# Patient Record
Sex: Female | Born: 1960 | Race: Black or African American | Hispanic: No | Marital: Single | Smoking: Former smoker
Health system: Southern US, Community
[De-identification: ages and names within clinical notes are randomized; demographics above are authoritative.]

## PROBLEM LIST (undated history)

## (undated) DIAGNOSIS — E119 Type 2 diabetes mellitus without complications: Secondary | ICD-10-CM

## (undated) DIAGNOSIS — F209 Schizophrenia, unspecified: Secondary | ICD-10-CM

## (undated) DIAGNOSIS — I10 Essential (primary) hypertension: Secondary | ICD-10-CM

## (undated) DIAGNOSIS — F319 Bipolar disorder, unspecified: Secondary | ICD-10-CM

---

## 2006-09-08 ENCOUNTER — Ambulatory Visit: Payer: Self-pay | Admitting: Family Medicine

## 2006-09-17 ENCOUNTER — Ambulatory Visit: Payer: Self-pay | Admitting: Family Medicine

## 2007-05-19 ENCOUNTER — Ambulatory Visit: Payer: Self-pay | Admitting: Family Medicine

## 2008-01-07 ENCOUNTER — Ambulatory Visit: Payer: Self-pay | Admitting: Family Medicine

## 2009-01-16 ENCOUNTER — Ambulatory Visit: Payer: Self-pay | Admitting: Nephrology

## 2009-01-30 ENCOUNTER — Observation Stay: Payer: Self-pay | Admitting: Nephrology

## 2009-03-05 ENCOUNTER — Ambulatory Visit: Payer: Self-pay | Admitting: Vascular Surgery

## 2009-03-16 ENCOUNTER — Ambulatory Visit: Payer: Self-pay | Admitting: Vascular Surgery

## 2009-03-21 ENCOUNTER — Ambulatory Visit: Payer: Self-pay | Admitting: Vascular Surgery

## 2009-06-27 ENCOUNTER — Ambulatory Visit: Payer: Self-pay | Admitting: Vascular Surgery

## 2009-07-03 ENCOUNTER — Ambulatory Visit: Payer: Self-pay | Admitting: Vascular Surgery

## 2009-08-20 ENCOUNTER — Ambulatory Visit: Payer: Self-pay | Admitting: Vascular Surgery

## 2009-09-10 ENCOUNTER — Ambulatory Visit: Payer: Self-pay | Admitting: Vascular Surgery

## 2009-10-25 ENCOUNTER — Ambulatory Visit: Payer: Self-pay | Admitting: Vascular Surgery

## 2010-01-12 ENCOUNTER — Emergency Department: Payer: Self-pay | Admitting: Nephrology

## 2010-01-12 ENCOUNTER — Other Ambulatory Visit: Payer: Self-pay | Admitting: Nephrology

## 2010-01-15 ENCOUNTER — Ambulatory Visit: Payer: Self-pay | Admitting: Vascular Surgery

## 2010-02-25 ENCOUNTER — Other Ambulatory Visit: Payer: Self-pay | Admitting: Nephrology

## 2010-02-26 ENCOUNTER — Ambulatory Visit: Payer: Self-pay | Admitting: Vascular Surgery

## 2010-04-18 ENCOUNTER — Ambulatory Visit: Payer: Self-pay | Admitting: Vascular Surgery

## 2010-05-02 ENCOUNTER — Ambulatory Visit: Payer: Self-pay | Admitting: Vascular Surgery

## 2010-05-21 ENCOUNTER — Ambulatory Visit: Payer: Self-pay | Admitting: Vascular Surgery

## 2010-05-29 ENCOUNTER — Emergency Department: Payer: Self-pay | Admitting: Emergency Medicine

## 2010-06-07 ENCOUNTER — Ambulatory Visit: Payer: Self-pay | Admitting: Vascular Surgery

## 2010-07-09 ENCOUNTER — Emergency Department: Payer: Self-pay | Admitting: Emergency Medicine

## 2010-08-06 ENCOUNTER — Ambulatory Visit: Payer: Self-pay | Admitting: Vascular Surgery

## 2010-09-19 ENCOUNTER — Ambulatory Visit: Payer: Self-pay | Admitting: Vascular Surgery

## 2010-10-01 ENCOUNTER — Ambulatory Visit: Payer: Self-pay | Admitting: Vascular Surgery

## 2010-10-02 ENCOUNTER — Inpatient Hospital Stay: Payer: Self-pay | Admitting: Vascular Surgery

## 2010-10-22 ENCOUNTER — Observation Stay: Payer: Self-pay | Admitting: Nephrology

## 2010-10-23 ENCOUNTER — Inpatient Hospital Stay: Payer: Self-pay | Admitting: Internal Medicine

## 2012-04-08 ENCOUNTER — Emergency Department: Payer: Self-pay | Admitting: Emergency Medicine

## 2012-04-08 LAB — COMPREHENSIVE METABOLIC PANEL
Albumin: 2.5 g/dL — ABNORMAL LOW (ref 3.4–5.0)
Alkaline Phosphatase: 131 U/L (ref 50–136)
Anion Gap: 7 (ref 7–16)
Bilirubin,Total: 0.3 mg/dL (ref 0.2–1.0)
Calcium, Total: 7.4 mg/dL — ABNORMAL LOW (ref 8.5–10.1)
Co2: 31 mmol/L (ref 21–32)
EGFR (African American): 9 — ABNORMAL LOW
EGFR (Non-African Amer.): 8 — ABNORMAL LOW
Glucose: 115 mg/dL — ABNORMAL HIGH (ref 65–99)
Osmolality: 278 (ref 275–301)
Potassium: 4.1 mmol/L (ref 3.5–5.1)
SGOT(AST): 26 U/L (ref 15–37)
Sodium: 135 mmol/L — ABNORMAL LOW (ref 136–145)

## 2012-04-08 LAB — URINALYSIS, COMPLETE
Bilirubin,UR: NEGATIVE
Blood: NEGATIVE
Ketone: NEGATIVE
Nitrite: NEGATIVE
Ph: 6 (ref 4.5–8.0)
Protein: 30
Specific Gravity: 1.01 (ref 1.003–1.030)

## 2012-04-08 LAB — CBC
HGB: 10.6 g/dL — ABNORMAL LOW (ref 12.0–16.0)
MCH: 33.4 pg (ref 26.0–34.0)
MCHC: 33.8 g/dL (ref 32.0–36.0)
Platelet: 165 10*3/uL (ref 150–440)
RBC: 3.16 10*6/uL — ABNORMAL LOW (ref 3.80–5.20)
RDW: 17.8 % — ABNORMAL HIGH (ref 11.5–14.5)

## 2012-04-08 LAB — VALPROIC ACID LEVEL: Valproic Acid: 95 ug/mL

## 2012-04-08 LAB — MAGNESIUM: Magnesium: 2.4 mg/dL

## 2012-04-08 LAB — TROPONIN I: Troponin-I: <0.02 ng/mL

## 2012-04-13 LAB — CULTURE, BLOOD (SINGLE)

## 2012-05-21 ENCOUNTER — Emergency Department: Payer: Self-pay | Admitting: Emergency Medicine

## 2012-05-21 LAB — COMPREHENSIVE METABOLIC PANEL
Albumin: 2.8 g/dL — ABNORMAL LOW (ref 3.4–5.0)
Alkaline Phosphatase: 121 U/L (ref 50–136)
Anion Gap: 9 (ref 7–16)
BUN: 35 mg/dL — ABNORMAL HIGH (ref 7–18)
Bilirubin,Total: 0.5 mg/dL (ref 0.2–1.0)
Creatinine: 5.56 mg/dL — ABNORMAL HIGH (ref 0.60–1.30)
Glucose: 126 mg/dL — ABNORMAL HIGH (ref 65–99)
Osmolality: 283 (ref 275–301)
SGOT(AST): 25 U/L (ref 15–37)
SGPT (ALT): 16 U/L (ref 12–78)
Total Protein: 8.1 g/dL (ref 6.4–8.2)

## 2012-05-21 LAB — CBC WITH DIFFERENTIAL/PLATELET
Basophil %: 0.1 %
Eosinophil %: 0.2 %
HCT: 30.9 % — ABNORMAL LOW (ref 35.0–47.0)
MCH: 34.7 pg — ABNORMAL HIGH (ref 26.0–34.0)
MCHC: 34.3 g/dL (ref 32.0–36.0)
Neutrophil #: 7.4 10*3/uL — ABNORMAL HIGH (ref 1.4–6.5)
Neutrophil %: 89.8 %
Platelet: 151 10*3/uL (ref 150–440)
RBC: 3.05 10*6/uL — ABNORMAL LOW (ref 3.80–5.20)
RDW: 18 % — ABNORMAL HIGH (ref 11.5–14.5)
WBC: 8.3 10*3/uL (ref 3.6–11.0)

## 2012-05-21 LAB — RAPID INFLUENZA A&B ANTIGENS

## 2012-05-27 LAB — CULTURE, BLOOD (SINGLE)

## 2012-09-17 ENCOUNTER — Inpatient Hospital Stay: Payer: Self-pay | Admitting: Internal Medicine

## 2012-09-17 LAB — COMPREHENSIVE METABOLIC PANEL
Albumin: 2.7 g/dL — ABNORMAL LOW (ref 3.4–5.0)
Anion Gap: 9 (ref 7–16)
BUN: 35 mg/dL — ABNORMAL HIGH (ref 7–18)
Calcium, Total: 9.3 mg/dL (ref 8.5–10.1)
Co2: 29 mmol/L (ref 21–32)
EGFR (African American): 7 — ABNORMAL LOW
EGFR (Non-African Amer.): 6 — ABNORMAL LOW
Osmolality: 284 (ref 275–301)
Potassium: 4.2 mmol/L (ref 3.5–5.1)
SGOT(AST): 19 U/L (ref 15–37)
SGPT (ALT): 14 U/L (ref 12–78)
Total Protein: 7.5 g/dL (ref 6.4–8.2)

## 2012-09-17 LAB — URINALYSIS, COMPLETE
Bilirubin,UR: NEGATIVE
Blood: NEGATIVE
Protein: 30
RBC,UR: NONE SEEN /HPF (ref 0–5)
Specific Gravity: 1.013 (ref 1.003–1.030)
Squamous Epithelial: NONE SEEN
WBC UR: <1 /HPF (ref 0–5)

## 2012-09-17 LAB — DRUG SCREEN, URINE
Amphetamines, Ur Screen: NEGATIVE (ref ?–1000)
Barbiturates, Ur Screen: NEGATIVE (ref ?–200)
Benzodiazepine, Ur Scrn: NEGATIVE (ref ?–200)
Cocaine Metabolite,Ur ~~LOC~~: NEGATIVE (ref ?–300)
MDMA (Ecstasy)Ur Screen: POSITIVE (ref ?–500)

## 2012-09-17 LAB — VALPROIC ACID LEVEL: Valproic Acid: 114 ug/mL — ABNORMAL HIGH

## 2012-09-17 LAB — CK TOTAL AND CKMB (NOT AT ARMC)
CK, Total: 53 U/L (ref 21–215)
CK-MB: <0.5 ng/mL — ABNORMAL LOW (ref 0.5–3.6)

## 2012-09-17 LAB — CBC
HGB: 10.1 g/dL — ABNORMAL LOW (ref 12.0–16.0)
MCH: 33.4 pg (ref 26.0–34.0)
MCHC: 34.3 g/dL (ref 32.0–36.0)
RDW: 15.5 % — ABNORMAL HIGH (ref 11.5–14.5)

## 2012-09-17 LAB — TROPONIN I: Troponin-I: <0.02 ng/mL

## 2012-09-17 LAB — AMMONIA: Ammonia, Plasma: 61 umol/L — ABNORMAL HIGH

## 2012-09-17 LAB — PHOSPHORUS: Phosphorus: 3.1 mg/dL

## 2012-09-20 LAB — PHOSPHORUS: Phosphorus: 2.9 mg/dL (ref 2.5–4.9)

## 2014-08-11 NOTE — Consult Note (Signed)
PATIENT NAMENECIE, [name] MR#:  [id number] DATE OF BIRTH:  [birth year]  SEX:  Female  RACE:  African-American  AGE:  54 years.  CONSULTING PHYSICIAN:  Tahmir Kleckner K. Franchot Mimes, MD  DATE OF ADMISSION:  09/17/2012  DATE OF DICTATION:  09/18/2012  PLACE OF DICTATION:  [place], room #123, [place], [place]  SUBJECTIVE:  The patient was seen in consultation in room #123. According to information obtained from chart, the patient has a long history of schizophrenia and is a dialysis patient and gets her dialysis Monday, Wednesday, Friday. She went for dialysis on Friday, 09/17/2012, and she had a syncopal episode and was brought to [facility]. She has end-stage renal disease and is on hemodialysis for the same. According to the information obtained, the patient's Depakote was at toxic level, and it is 114 mcg/m on admission. In addition, she was positive for MDMA. The patient has been stopped of all psychiatric medications, and is being followed by Dr. Holley Raring. Depakote is not dialyze-able by hemodialysis, as per Dr. Holley Raring, so levels will be followed clinically.  MENTAL STATUS:  The patient was seen in the Nurses station in a geri chair She appears calm. She appears alert, but she is not oriented to place or time, and she said today is Monday. She is oriented to self only, that is 1. She appears calm and quiet, really confused. Does not appear to be actively responding to any internal stimuli, and no agitation noted. Further mental status could not be done. Insight and judgment appear to be guarded and poor.  IMPRESSION:  Schizophrenia, chronic.  RECOMMEND:  Continue to hold off psychiatric medications until the patient is stabilized and her labs come back within normal limits, and then she can be reevaluated for medications. Currently the patient does not seem to be agitated and appears to be calm. There is no need for even p.r.n. medication unless she must have it for  agitation.   ____________________________ Wallace Cullens. Franchot Mimes, MD skc:mr D: 09/18/2012 16:56:00 ET T: 09/18/2012 18:12:52 ET JOB#: [id number]  cc: Arlyn Leak K. Franchot Mimes, MD, <Dictator> Dewain Penning MD ELECTRONICALLY SIGNED 09/19/2012 15:45

## 2014-08-11 NOTE — Discharge Summary (Signed)
PATIENT NAMERAKHEE, [name] MR#:  [id number] DATE OF BIRTH:  1960-09-12  DATE OF ADMISSION:  09/17/2012 DATE OF DISCHARGE:  09/20/2012   PRESENTING COMPLAINT: Confusion, altered mental status.   DISCHARGE DIAGNOSES: 1.  Acute encephalopathy suspected due to polypharmacy and likely Depakote toxicity, improved, resolved.  2.  Depakote toxicity, resolved, medication dosage adjusted.  3.  End-stage renal dialysis.  4.  Fall with contusion over the lips and mouth, no active bleeding.  5.  Chronic schizophrenia.  6.  Type 2 diabetes.  7.  Hyperlipidemia.   CONDITION ON DISCHARGE: Fair.   MEDICATIONS: 1.  Tylenol 650 mg p.o. q.4 p.r.n.  2.  Aspirin 81 mg daily.  3.  Zocor 40 mg at bedtime.  4.  Renvela 0.8 g t.i.d. with meals.  5.  Multivitamin p.o. daily.  6.  Wellbutrin XL 150 p.o. daily.  7.  Ativan 0.5 mg b.i.d. p.r.n. for anxiety.  8.  Depakote extended-release 500 mg daily.  9.  Fluphenazine 2.5 mg  daily.  10.  Lantus 30 units at bedtime.  11.  Glucotrol 5 mg daily.   DISCHARGE INSTRUCTIONS:   1.  Renal diet, 1800 calorie ADA diet.  2.  Resume home health RN, PT.  3.  Continue outpatient hemodialysis as before.  4.  Accu-Cheks a.c. and h.s.   PRIMARY CARE PHYSICIAN: Manual Meier. Bridget Hartshorn, MD   CONSULTATIONS: Psychiatric consultation with Dr. Franchot Mimes. Nephrology consultation with Dr. Holley Raring.   LABORATORY AND RADIOLOGICAL DATA AT DISCHARGE: Phosphorus is 2.9. Ammonia is 33. Depakote level down to 36.   Chest x-ray: Bilateral atelectasis.   CT of the head: No acute intracranial abnormality.   CT of the cervical spine: No evidence of acute osseous abnormality.   CT of maxillofacial: No CT evidence of acute osseous abnormalities.   BRIEF SUMMARY OF HOSPITAL COURSE: [name] is a 54 year old African American female with history of chronic schizophrenia, end-stage renal disease on hemodialysis, comes to the Emergency Room from her group home after she was found confused with  altered mental status, had an accident and fall with contusion over her lips and mouth. She was admitted with:  1.  Acute encephalopathy/acute altered mental status due to suspected Depakote toxicity: Her levels were 114 mcg/mL. All her psych meds were held. She was given some IV fluids. Routine dialysis was done. No evidence of sepsis was noted. Her mentation improved, appears to be at baseline.  2.  Depakote toxicity: The patient's Depakote was held. Her levels were 114 mcg/mL, came down to 36. Mentation improved.  3.  End-stage renal disease: Hemodialysis was continued while in-house.  4.  Chronic schizophrenia:  Dr. Franchot Mimes saw the patient and we adjusted her home medication dosage.  5.  Type 2 diabetes: On Lantus, glipizide and sliding scale.  6.  Hyperlipidemia: Continue statins.   Overall hospital stay remained stable.   CODE STATUS: The patient remained a full code.   TIME SPENT: 40 minutes.   ____________________________ Hart Rochester Posey Pronto, MD sap:jm D: 09/20/2012 13:03:43 ET T: 09/20/2012 14:14:33 ET JOB#: [id number]  cc: Jamerion Cabello A. Posey Pronto, MD, <Dictator> Manual Meier. Bridget Hartshorn, [place] Franchot Mimes, MD [place] Lilian Kapur, MD  Ilda Basset MD ELECTRONICALLY SIGNED 09/23/2012 19:38

## 2014-08-11 NOTE — H&P (Signed)
PATIENT NAMECHAUNDA, [name] MR#:  [id number] DATE OF BIRTH:  03/12/1961  DATE OF ADMISSION:  09/17/2012  PRIMARY CARE PHYSICIAN: Laura Meier. Laura Hartshorn, MD  CHIEF COMPLAINT: Altered mental status, fall today.   HISTORY OF PRESENT ILLNESS: History is obtained from ER MD and staff. The patient is unable to give much history and review of systems. [name] is an obese 54 year old African American female with history of end-stage renal disease on hemodialysis Monday, Wednesday, Friday, history of chronic schizophrenia and type 2 diabetes, comes to the Emergency Room after she had a fall at the group home/nursing home. She fell front face, hitting her mouth. The patient had been lethargic for a couple of days per the facility staff. The patient here  was found to have elevated valproic acid level of 114 mcg/mL. There is no evidence of any sepsis noted at this time. She empirically received a dose of vancomycin and Zosyn in the Emergency Room.   PAST MEDICAL HISTORY: 1.  End-stage renal disease on hemodialysis Monday, Wednesday, Friday.  2.  Chronic schizophrenia.  3.  Anemia of chronic disease.  4.  Hypertension, but the patient is not on any antihypertensive at this time.  5.  Type 2 diabetes, on insulin.   ALLERGIES: No known drug allergies.   MEDICATIONS: 1.  Aspirin 81 mg daily.  2.  Benztropine 1 mg at bedtime.  3.  Depakote ER 500 mg extended-release 4 tablets, which is 2000 mg, at bedtime.  4.  Fluphenazine 5 mg at bedtime.  5.  Glipizide 5 mg p.o. b.i.d.  6.  Lantus 30 units at bedtime.  7.  Lorazepam 1 mg 4 times a day.  8.  Multivitamin p.o. daily.  9.  Rena-Vite p.o. daily.  10.  Renvela 800 mg 1 tablet 3 times a day with meals.  11.  Simvastatin 40 mg daily.  12.  Tylenol 500 mg daily.  13.  Wellbutrin XL 150 mg daily.   SOCIAL HISTORY: The patient is a resident at a group home. There is no history of smoking, alcohol or drug abuse from old records.   FAMILY HISTORY: From old  records, mother had diabetes, kidney disease. Father had heart disease and peptic ulcer disease.   REVIEW OF SYSTEMS: Unobtainable.   PHYSICAL EXAMINATION: GENERAL: The patient is morbidly obese, not in acute distress. She is lethargic, opens eyes to verbal commands. She moves all her extremities well. No focal neuro deficit noted.  VITAL SIGNS: The patient is afebrile. Pulse is 77, blood pressure 174/76, sats 99% on room air.  HEENT: Head is atraumatic. Eyes: PERRLA. EOM intact. Oral mucosa: There is some bleeding present over the front teeth and lower lip, which appears a little bit swollen. No active bleeding noted at this time. No laceration noted.  NECK: Supple. No JVD. No carotid bruit.  LUNGS: Clear to auscultation bilaterally. No rales, rhonchi, respiratory distress or labored breathing.  HEART: Both the heart sounds are normal. Rate, rhythm regular. PMI not lateralized. Chest nontender. EXTREMITIES: Feeble pedal pulses, good femoral pulses. No lower extremity edema.  ABDOMEN: Soft,  benign, nontender. No organomegaly. Positive bowel sounds.  NEUROLOGIC: Limited secondary to the patient's lethargy. She moves all her extremities well. No focal neuro deficit noted. No focal cranial nerve deficit noted at this time.  SKIN: Warm and dry.   RADIOLOGICAL STUDIES: Chest x-ray: Mild bibasilar opacities, likely secondary to atelectasis.   CT of the head without acute abnormalities.   CT of the  cervical spine: No evidence of any acute osseous abnormality.   CT maxillofacial: No evidence of acute fracture or dislocation. There are findings concerning for a fracture involving the lower second molar on the left. Also concerning, multiple dental caries.   LABORATORY AND DIAGNOSTIC DATA: Her pH is 7.42; pCO2 is 48; PaO2 is 77. Glucose is 145, BUN 35, creatinine 6.81, sodium 132, potassium 4.2, chloride 99, bicarbonate 29. LFTs within normal limits. White count is 7.4, hemoglobin and hematocrit is  10.1 and 29.5, platelet count 129. Urinalysis negative for urinary tract infection. Urine drug screen positive for MDMA. Valproic acid is 114 mcg/ mL. Cardiac enzymes are negative.   EKG shows sinus rhythm with PACs, flipped T waves in anterior leads.   ASSESSMENT AND PLAN: [name] is a 54 year old with history of chronic schizophrenia, type 2 diabetes, end-stage renal disease, comes in with:  1.  Acute encephalopathy/altered mental status, status post fall at the group home: At this time appears to be due to Depakote toxicity. No evidence of sepsis at present. Chest x-ray is clear. Urine is clear. The patient is afebrile. White count is normal. We will admit the patient to the medical floor, keep her "nothing by mouth"  at this time. Continue some intravenous fluids at low rate. Follow up Depakote levels closely. Neuro checks every 4 hours. 2.  Depakote toxicity: Levels are 114 mcg/mL. Is not dialyzable through conventional hemodialysis per Dr. Holley Padilla. Will follow levels clinically. Hold psychiatric medications.  3.  End-stage renal disease: Dr. Holley Padilla to see patient. Resume inpatient hemodialysis.  4.  Fall with contusion over the lip and mouth along with fracture of the left second molar, with multiple dental caries according to CT maxillofacial area. The patient will need a dental appointment at discharge. At this time, there is no active bleeding. We will continue to monitor for any bleeding and continue oral hygiene.  5.  Chronic schizophrenia: Hold psychiatric medications. Will have psychiatric inpatient evaluation for adjustment of Depakote dose if needed. She is taking about 2000 mg daily.  6.  Type 2 diabetes: Since the patient is going to be "nothing by mouth,"  will keep her on sliding scale insulin for now. Resume her Lantus and glipizide once she is able to take oral.  7.  Hyperlipidemia: Oral statins.  8.  Deep vein thrombosis prophylaxis with subcutaneous heparin.  9.  Further work-up  according to the patient's clinical course. Hospital admission plan was discussed with the patient was discussed with the ER physician.   10.  Please note, the patient's tetanus is up to date.  11.  Please note, the patient is from [facility]. Phone number is [phone]. Caregiver is Romie Levee.    ____________________________ Hart Rochester Posey Pronto, MD sap:jm D: 09/17/2012 14:57:34 ET T: 09/17/2012 15:26:25 ET JOB#: [id number]  cc: Bacilio Abascal A. Posey Pronto, MD, <Dictator> Laura Meier. Laura Hartshorn, MD Ilda Basset MD ELECTRONICALLY SIGNED 09/23/2012 19:38

## 2016-09-18 ENCOUNTER — Other Ambulatory Visit: Payer: Self-pay | Admitting: Family Medicine

## 2016-09-18 DIAGNOSIS — Z1231 Encounter for screening mammogram for malignant neoplasm of breast: Secondary | ICD-10-CM

## 2016-10-09 ENCOUNTER — Ambulatory Visit
Admission: RE | Admit: 2016-10-09 | Discharge: 2016-10-09 | Disposition: A | Discharge status: Home / Self Care | Payer: Medicaid Other | Source: Ambulatory Visit | Attending: Family Medicine | Admitting: Family Medicine

## 2016-10-09 DIAGNOSIS — Z1231 Encounter for screening mammogram for malignant neoplasm of breast: Secondary | ICD-10-CM

## 2016-10-21 ENCOUNTER — Emergency Department (HOSPITAL_COMMUNITY): Payer: Medicaid Other

## 2016-10-21 ENCOUNTER — Inpatient Hospital Stay (HOSPITAL_COMMUNITY)
Admission: EM | Admit: 2016-10-21 | Discharge: 2016-10-24 | DRG: 091 | Disposition: A | Discharge status: Nursing Home (Includes ICF) | Payer: Medicaid Other | Attending: Internal Medicine | Admitting: Internal Medicine

## 2016-10-21 ENCOUNTER — Encounter (HOSPITAL_COMMUNITY): Payer: Self-pay | Admitting: Emergency Medicine

## 2016-10-21 DIAGNOSIS — D631 Anemia in chronic kidney disease: Secondary | ICD-10-CM | POA: Diagnosis present

## 2016-10-21 DIAGNOSIS — I120 Hypertensive chronic kidney disease with stage 5 chronic kidney disease or end stage renal disease: Secondary | ICD-10-CM | POA: Diagnosis present

## 2016-10-21 DIAGNOSIS — Z720 Tobacco use: Secondary | ICD-10-CM

## 2016-10-21 DIAGNOSIS — R4182 Altered mental status, unspecified: Secondary | ICD-10-CM

## 2016-10-21 DIAGNOSIS — G92 Toxic encephalopathy: Principal | ICD-10-CM | POA: Diagnosis present

## 2016-10-21 DIAGNOSIS — T433X5A Adverse effect of phenothiazine antipsychotics and neuroleptics, initial encounter: Secondary | ICD-10-CM | POA: Diagnosis present

## 2016-10-21 DIAGNOSIS — F209 Schizophrenia, unspecified: Secondary | ICD-10-CM | POA: Diagnosis present

## 2016-10-21 DIAGNOSIS — E1122 Type 2 diabetes mellitus with diabetic chronic kidney disease: Secondary | ICD-10-CM | POA: Diagnosis present

## 2016-10-21 DIAGNOSIS — G9340 Encephalopathy, unspecified: Secondary | ICD-10-CM

## 2016-10-21 DIAGNOSIS — Z7982 Long term (current) use of aspirin: Secondary | ICD-10-CM

## 2016-10-21 DIAGNOSIS — E8889 Other specified metabolic disorders: Secondary | ICD-10-CM | POA: Diagnosis present

## 2016-10-21 DIAGNOSIS — T43595A Adverse effect of other antipsychotics and neuroleptics, initial encounter: Secondary | ICD-10-CM | POA: Diagnosis present

## 2016-10-21 DIAGNOSIS — F319 Bipolar disorder, unspecified: Secondary | ICD-10-CM | POA: Diagnosis present

## 2016-10-21 DIAGNOSIS — N186 End stage renal disease: Secondary | ICD-10-CM

## 2016-10-21 DIAGNOSIS — R4702 Dysphasia: Secondary | ICD-10-CM

## 2016-10-21 DIAGNOSIS — Z992 Dependence on renal dialysis: Secondary | ICD-10-CM

## 2016-10-21 DIAGNOSIS — E119 Type 2 diabetes mellitus without complications: Secondary | ICD-10-CM

## 2016-10-21 DIAGNOSIS — T424X5A Adverse effect of benzodiazepines, initial encounter: Secondary | ICD-10-CM | POA: Diagnosis present

## 2016-10-21 DIAGNOSIS — Z794 Long term (current) use of insulin: Secondary | ICD-10-CM

## 2016-10-21 DIAGNOSIS — I10 Essential (primary) hypertension: Secondary | ICD-10-CM

## 2016-10-21 DIAGNOSIS — I4581 Long QT syndrome: Secondary | ICD-10-CM | POA: Diagnosis present

## 2016-10-21 DIAGNOSIS — E11649 Type 2 diabetes mellitus with hypoglycemia without coma: Secondary | ICD-10-CM | POA: Diagnosis present

## 2016-10-21 HISTORY — DX: Type 2 diabetes mellitus without complications: E11.9

## 2016-10-21 HISTORY — DX: Schizophrenia, unspecified: F20.9

## 2016-10-21 HISTORY — DX: Bipolar disorder, unspecified: F31.9

## 2016-10-21 HISTORY — DX: Essential (primary) hypertension: I10

## 2016-10-21 LAB — CBC
HCT: 29.8 % — ABNORMAL LOW (ref 36.0–46.0)
HEMOGLOBIN: 10 g/dL — AB (ref 12.0–15.0)
MCH: 31.1 pg (ref 26.0–34.0)
MCHC: 33.6 g/dL (ref 30.0–36.0)
MCV: 92.5 fL (ref 78.0–100.0)
Platelets: 230 10*3/uL (ref 150–400)
RBC: 3.22 MIL/uL — AB (ref 3.87–5.11)
RDW: 13.8 % (ref 11.5–15.5)
WBC: 7 10*3/uL (ref 4.0–10.5)

## 2016-10-21 LAB — COMPREHENSIVE METABOLIC PANEL
ALT: 14 U/L (ref 14–54)
ANION GAP: 10 (ref 5–15)
AST: 15 U/L (ref 15–41)
Albumin: 3.6 g/dL (ref 3.5–5.0)
Alkaline Phosphatase: 87 U/L (ref 38–126)
BILIRUBIN TOTAL: 0.4 mg/dL (ref 0.3–1.2)
BUN: 27 mg/dL — ABNORMAL HIGH (ref 6–20)
CALCIUM: 9.3 mg/dL (ref 8.9–10.3)
CO2: 31 mmol/L (ref 22–32)
Chloride: 93 mmol/L — ABNORMAL LOW (ref 101–111)
Creatinine, Ser: 6.32 mg/dL — ABNORMAL HIGH (ref 0.44–1.00)
GFR, EST AFRICAN AMERICAN: 8 mL/min — AB (ref >60)
GFR, EST NON AFRICAN AMERICAN: 7 mL/min — AB (ref >60)
Glucose, Bld: 146 mg/dL — ABNORMAL HIGH (ref 65–99)
Potassium: 4 mmol/L (ref 3.5–5.1)
Sodium: 134 mmol/L — ABNORMAL LOW (ref 135–145)
TOTAL PROTEIN: 7.5 g/dL (ref 6.5–8.1)

## 2016-10-21 LAB — CBG MONITORING, ED: GLUCOSE-CAPILLARY: 154 mg/dL — AB (ref 65–99)

## 2016-10-21 LAB — TROPONIN I: Troponin I: <0.03 ng/mL (ref <0.03)

## 2016-10-21 LAB — AMMONIA: AMMONIA: 28 umol/L (ref 9–35)

## 2016-10-21 NOTE — ED Triage Notes (Signed)
Pt went to dialysis yesterday, has been confused and lethargic since.  Pt unable to recall birthday, where she is, or why she is here.

## 2016-10-21 NOTE — ED Notes (Signed)
Ambulated patient around the nurses station. Patient had no problems walking but she still was altered.

## 2016-10-21 NOTE — ED Provider Notes (Signed)
[facility] DEPT Provider Note   CSN: [id number] Arrival date & time: 10/21/16  2121     History   Chief Complaint Chief Complaint  Patient presents with  . Altered Mental Status    HPI [name] is a 56 y.o. female.  HPI  The patient is a 56 year old female, she has a history of end-stage renal disease on dialysis, she is also a diabetic on sliding scale insulin, has bipolar disorder and takes Depakote as well as other medications to treat mental health disorders. The patient is unable to give any information that she is somnolent too obtunded and according to the paramedics prehospital she has been this way for the last day and a half since leaving dialysis. There was no report of fever, vomiting, diarrhea, she is on sliding scale insulin. There is no report of a prehospital blood sugar check according to the nurse.     Past Medical History:  Diagnosis Date  . Bipolar 1 disorder ([place])   . Diabetes mellitus without complication ([place])   . Hypertension   . Schizophrenia ([place])     There are no active problems to display for this patient.   No past surgical history on file.  OB History    No data available       Home Medications    Prior to Admission medications   Medication Sig Start Date End Date Taking? Authorizing Provider  acetaminophen (TYLENOL) 500 MG tablet Take 500 mg by mouth every 6 (six) hours as needed.   Yes [provider]  aspirin EC 81 MG tablet Take 81 mg by mouth daily.   Yes [provider]  benztropine (COGENTIN) 1 MG tablet Take 1 mg by mouth.   Yes [provider]  buPROPion (WELLBUTRIN XL) 300 MG 24 hr tablet Take 300 mg by mouth daily.   Yes [provider]  cinacalcet (SENSIPAR) 30 MG tablet Take 30 mg by mouth 3 (three) times a week. Take 30mg  on Tuesday, Thursday, and Saturday   Yes [provider]  divalproex (DEPAKOTE) 500 MG DR tablet Take 500 mg by mouth.   Yes [provider]    docusate sodium (COLACE) 100 MG capsule Take 100 mg by mouth.   Yes [provider]  famotidine (PEPCID) 40 MG tablet Take 40 mg by mouth daily.   Yes [provider]  fluPHENAZine (PROLIXIN) 5 MG tablet Take 5 mg by mouth.   Yes [provider]  glipiZIDE (GLUCOTROL) 5 MG tablet Take 5 mg by mouth.   Yes [provider]  insulin glargine (LANTUS) 100 UNIT/ML injection Inject into the skin.   Yes [provider]  latanoprost (XALATAN) 0.005 % ophthalmic solution 1 drop.   Yes [provider]  LORazepam (ATIVAN) 0.5 MG tablet Take 0.5 mg by mouth 2 (two) times daily. Take at 8:00am and 8:00pm   Yes [provider]  Multiple Vitamin (MULTIVITAMIN WITH MINERALS) TABS tablet Take 1 tablet by mouth daily.   Yes [provider]  paliperidone (INVEGA) 3 MG 24 hr tablet Take 3 mg by mouth.   Yes [provider]  sevelamer carbonate (RENVELA) 800 MG tablet Take 800 mg by mouth.   Yes [provider]    Family History No family history on file.  Social History Social History  Substance Use Topics  . Smoking status: Unknown If Ever Smoked  . Smokeless tobacco: Current User  . Alcohol use No     Allergies  Patient has no known allergies.   Review of Systems Review of Systems  Unable to perform ROS: Mental status change     Physical Exam Updated Vital Signs BP (!) 145/77   Pulse 65   Temp 97.8 F (36.6 C) (Oral)   Resp 19   Wt 68 kg (150 lb)   SpO2 99%   Physical Exam  Constitutional: She appears well-developed and well-nourished.  Somnolent - arousable to voice  HENT:  Head: Normocephalic and atraumatic.  Mouth/Throat: No oropharyngeal exudate.  Oral cavity is filled with snuff  Eyes: Conjunctivae and EOM are normal. Pupils are equal, round, and reactive to light. Right eye exhibits no discharge. Left eye exhibits no discharge. No scleral icterus.  Neck: Normal range of motion. Neck  supple. No JVD present. No thyromegaly present.  Cardiovascular: Normal rate, regular rhythm, normal heart sounds and intact distal pulses.  Exam reveals no gallop and no friction rub.   No murmur heard. Normal thrill in the RUE over the access  Pulmonary/Chest: Effort normal and breath sounds normal. No respiratory distress. She has no wheezes. She has no rales.  Abdominal: Soft. Bowel sounds are normal. She exhibits no distension and no mass. There is no tenderness.  Musculoskeletal: Normal range of motion. She exhibits no edema or tenderness.  Lymphadenopathy:    She has no cervical adenopathy.  Neurological:  The  patient is able to open her eyes, she has symmetrical pupils, she is able to help sit up in the bed and is able to stay in a sitting position during the exam of her posterior thorax. She is able to lift all 4 extremities with 5 out of 5 strength but does appear to fall asleep very quicklyand becomes somnolent again. Her voice is quiet but answers are appropriate   Skin: Skin is warm and dry. No rash noted. No erythema.  Psychiatric: She has a normal mood and affect. Her behavior is normal.  Nursing note and vitals reviewed.    ED Treatments / Results  Labs (all labs ordered are listed, but only abnormal results are displayed) Labs Reviewed  COMPREHENSIVE METABOLIC PANEL - Abnormal; Notable for the following:       Result Value   Sodium 134 (*)    Chloride 93 (*)    Glucose, Bld 146 (*)    BUN 27 (*)    Creatinine, Ser 6.32 (*)    GFR calc non Af Amer 7 (*)    GFR calc Af Amer 8 (*)    All other components within normal limits  CBC - Abnormal; Notable for the following:    RBC 3.22 (*)    Hemoglobin 10.0 (*)    HCT 29.8 (*)    All other components within normal limits  CBG MONITORING, ED - Abnormal; Notable for the following:    Glucose-Capillary 154 (*)    All other components within normal limits  AMMONIA  TROPONIN I    EKG  EKG  Interpretation  Date/Time:  Tuesday October 21 2016 21:27:43 EDT Ventricular Rate:  71 PR Interval:    QRS Duration: 100 QT Interval:  552 QTC Calculation: 600 R Axis:   19 Text Interpretation:  Sinus rhythm Left ventricular hypertrophy QT normal (lead V2 example) since last tracing no significant change Confirmed by Noemi Chapel [phone]) on 10/21/2016 9:41:28 PM       Radiology Dg Chest 1 View  Result Date: 10/21/2016 CLINICAL DATA:  Altered mental status after dialysis EXAM: CHEST 1 VIEW  COMPARISON:  Chest radiograph 09/17/2012 FINDINGS: Right IJ approach hemodialysis catheter tip overlies the intrahepatic inferior vena cava. Position is approximately unchanged from the study of 09/17/2012 when allowing for technical differences. There is bibasilar atelectasis without other focal consolidation. No pulmonary edema. Heart size is normal. IMPRESSION: No active disease. Electronically Signed   By: Ulyses Jarred M.D.   On: 10/21/2016 23:03   Ct Head Wo Contrast  Result Date: 10/21/2016 CLINICAL DATA:  Acute onset of altered level of consciousness. Initial encounter. EXAM: CT HEAD WITHOUT CONTRAST TECHNIQUE: Contiguous axial images were obtained from the base of the skull through the vertex without intravenous contrast. COMPARISON:  CT of the head performed 09/17/2012 FINDINGS: Brain: No evidence of acute infarction, hemorrhage, hydrocephalus, extra-axial collection or mass lesion/mass effect. Prominence of the ventricles and sulci reflects moderate cortical volume loss. Mild periventricular white matter change likely reflects small vessel ischemic microangiopathy. The brainstem and fourth ventricle are within normal limits. The basal ganglia are unremarkable in appearance. The cerebral hemispheres demonstrate grossly normal gray-white differentiation. No mass effect or midline shift is seen. Vascular: No hyperdense vessel or unexpected calcification. Skull: There is no evidence of fracture; visualized  osseous structures are unremarkable in appearance. Sinuses/Orbits: The visualized portions of the orbits are within normal limits. The paranasal sinuses and mastoid air cells are well-aerated. Other: No significant soft tissue abnormalities are seen. IMPRESSION: 1. No acute intracranial pathology seen on CT. 2. Moderate cortical volume loss and scattered small vessel ischemic microangiopathy. Electronically Signed   By: Garald Balding M.D.   On: 10/21/2016 22:53    Procedures Procedures (including critical care time)  Medications Ordered in ED Medications - No data to display   Initial Impression / Assessment and Plan / ED Course  I have reviewed the triage vital signs and the nursing notes.  Pertinent labs & imaging results that were available during my care of the patient were reviewed by me and considered in my medical decision making (see chart for details).   The patient is somnolent, the exact source of the patient's symptoms is unclear however she will need to have a lab workup as well as a CT scan of the brain, EKG does not show any acute findings other than left ventricular hypertrophy.   The pt has not had any acute findings on labs or imaging - her ECG is unchanged - no answer - pt was able to ambulate but with assistance and is still confused and mildly somnolent -   D/w Dr. Darrick Meigs who will admit.  Final Clinical Impressions(s) / ED Diagnoses   Final diagnoses:  Altered mental status, unspecified altered mental status type    New Prescriptions New Prescriptions   No medications on file     Noemi Chapel, MD 10/22/16 0000

## 2016-10-22 DIAGNOSIS — Z794 Long term (current) use of insulin: Secondary | ICD-10-CM

## 2016-10-22 DIAGNOSIS — G9340 Encephalopathy, unspecified: Secondary | ICD-10-CM | POA: Diagnosis not present

## 2016-10-22 DIAGNOSIS — I10 Essential (primary) hypertension: Secondary | ICD-10-CM

## 2016-10-22 DIAGNOSIS — E8889 Other specified metabolic disorders: Secondary | ICD-10-CM | POA: Diagnosis present

## 2016-10-22 DIAGNOSIS — E1122 Type 2 diabetes mellitus with diabetic chronic kidney disease: Secondary | ICD-10-CM | POA: Diagnosis present

## 2016-10-22 DIAGNOSIS — F209 Schizophrenia, unspecified: Secondary | ICD-10-CM | POA: Diagnosis present

## 2016-10-22 DIAGNOSIS — F319 Bipolar disorder, unspecified: Secondary | ICD-10-CM | POA: Diagnosis present

## 2016-10-22 DIAGNOSIS — D631 Anemia in chronic kidney disease: Secondary | ICD-10-CM | POA: Diagnosis present

## 2016-10-22 DIAGNOSIS — I4581 Long QT syndrome: Secondary | ICD-10-CM | POA: Diagnosis present

## 2016-10-22 DIAGNOSIS — T433X5A Adverse effect of phenothiazine antipsychotics and neuroleptics, initial encounter: Secondary | ICD-10-CM | POA: Diagnosis present

## 2016-10-22 DIAGNOSIS — T424X5A Adverse effect of benzodiazepines, initial encounter: Secondary | ICD-10-CM | POA: Diagnosis present

## 2016-10-22 DIAGNOSIS — G92 Toxic encephalopathy: Secondary | ICD-10-CM | POA: Diagnosis present

## 2016-10-22 DIAGNOSIS — Z7982 Long term (current) use of aspirin: Secondary | ICD-10-CM | POA: Diagnosis not present

## 2016-10-22 DIAGNOSIS — Z992 Dependence on renal dialysis: Secondary | ICD-10-CM

## 2016-10-22 DIAGNOSIS — E11649 Type 2 diabetes mellitus with hypoglycemia without coma: Secondary | ICD-10-CM | POA: Diagnosis present

## 2016-10-22 DIAGNOSIS — N186 End stage renal disease: Secondary | ICD-10-CM | POA: Diagnosis present

## 2016-10-22 DIAGNOSIS — E119 Type 2 diabetes mellitus without complications: Secondary | ICD-10-CM

## 2016-10-22 DIAGNOSIS — T43595A Adverse effect of other antipsychotics and neuroleptics, initial encounter: Secondary | ICD-10-CM | POA: Diagnosis present

## 2016-10-22 DIAGNOSIS — I120 Hypertensive chronic kidney disease with stage 5 chronic kidney disease or end stage renal disease: Secondary | ICD-10-CM | POA: Diagnosis present

## 2016-10-22 DIAGNOSIS — R4182 Altered mental status, unspecified: Secondary | ICD-10-CM

## 2016-10-22 DIAGNOSIS — R4702 Dysphasia: Secondary | ICD-10-CM | POA: Diagnosis not present

## 2016-10-22 DIAGNOSIS — Z720 Tobacco use: Secondary | ICD-10-CM | POA: Diagnosis not present

## 2016-10-22 LAB — BLOOD GAS, ARTERIAL
ACID-BASE EXCESS: 6 mmol/L — AB (ref 0.0–2.0)
BICARBONATE: 29.3 mmol/L — AB (ref 20.0–28.0)
Drawn by: 270161
FIO2: 0.21
O2 Saturation: 95.9 %
PH ART: 7.399 (ref 7.350–7.450)
PO2 ART: 81.2 mmHg — AB (ref 83.0–108.0)
Patient temperature: 37
pCO2 arterial: 50.7 mmHg — ABNORMAL HIGH (ref 32.0–48.0)

## 2016-10-22 LAB — COMPREHENSIVE METABOLIC PANEL
ALT: 14 U/L (ref 14–54)
AST: 18 U/L (ref 15–41)
Albumin: 3.6 g/dL (ref 3.5–5.0)
Alkaline Phosphatase: 90 U/L (ref 38–126)
Anion gap: 10 (ref 5–15)
BILIRUBIN TOTAL: 0.6 mg/dL (ref 0.3–1.2)
BUN: 28 mg/dL — ABNORMAL HIGH (ref 6–20)
CHLORIDE: 97 mmol/L — AB (ref 101–111)
CO2: 31 mmol/L (ref 22–32)
CREATININE: 6.73 mg/dL — AB (ref 0.44–1.00)
Calcium: 9.5 mg/dL (ref 8.9–10.3)
GFR calc non Af Amer: 6 mL/min — ABNORMAL LOW (ref >60)
GFR, EST AFRICAN AMERICAN: 7 mL/min — AB (ref >60)
Glucose, Bld: 93 mg/dL (ref 65–99)
POTASSIUM: 4.2 mmol/L (ref 3.5–5.1)
Sodium: 138 mmol/L (ref 135–145)
TOTAL PROTEIN: 7.6 g/dL (ref 6.5–8.1)

## 2016-10-22 LAB — CBC
HCT: 33.3 % — ABNORMAL LOW (ref 36.0–46.0)
Hemoglobin: 11.1 g/dL — ABNORMAL LOW (ref 12.0–15.0)
MCH: 30.9 pg (ref 26.0–34.0)
MCHC: 33.3 g/dL (ref 30.0–36.0)
MCV: 92.8 fL (ref 78.0–100.0)
PLATELETS: 221 10*3/uL (ref 150–400)
RBC: 3.59 MIL/uL — ABNORMAL LOW (ref 3.87–5.11)
RDW: 13.9 % (ref 11.5–15.5)
WBC: 5.4 10*3/uL (ref 4.0–10.5)

## 2016-10-22 LAB — GLUCOSE, CAPILLARY
GLUCOSE-CAPILLARY: 84 mg/dL (ref 65–99)
GLUCOSE-CAPILLARY: 308 mg/dL — AB (ref 65–99)
GLUCOSE-CAPILLARY: 140 mg/dL — AB (ref 65–99)
Glucose-Capillary: 93 mg/dL (ref 65–99)

## 2016-10-22 LAB — T4, FREE: FREE T4: 0.87 ng/dL (ref 0.61–1.12)

## 2016-10-22 LAB — TSH: TSH: 2.331 u[IU]/mL (ref 0.350–4.500)

## 2016-10-22 LAB — HIV ANTIBODY (ROUTINE TESTING W REFLEX): HIV Screen 4th Generation wRfx: NONREACTIVE

## 2016-10-22 LAB — MAGNESIUM: MAGNESIUM: 2.6 mg/dL — AB (ref 1.7–2.4)

## 2016-10-22 LAB — VITAMIN B12: VITAMIN B 12: 973 pg/mL — AB (ref 180–914)

## 2016-10-22 LAB — VALPROIC ACID LEVEL: VALPROIC ACID LVL: 25 ug/mL — AB (ref 50.0–100.0)

## 2016-10-22 MED ORDER — HYDRALAZINE HCL 20 MG/ML IJ SOLN
5.0000 mg | Freq: Four times a day (QID) | INTRAMUSCULAR | Status: DC | PRN
  Administered 2016-10-22: 5 mg via INTRAVENOUS
  Filled 2016-10-22: qty 1

## 2016-10-22 MED ORDER — ONDANSETRON HCL 4 MG PO TABS: 4.0000 mg | ORAL_TABLET | Freq: Four times a day (QID) | ORAL | Status: DC | PRN

## 2016-10-22 MED ORDER — ASPIRIN EC 81 MG PO TBEC
81.0000 mg | DELAYED_RELEASE_TABLET | Freq: Every day | ORAL | Status: DC
  Administered 2016-10-22 – 2016-10-24 (×3): 81 mg via ORAL
  Filled 2016-10-22 (×3): qty 1

## 2016-10-22 MED ORDER — BUPROPION HCL ER (XL) 300 MG PO TB24
300.0000 mg | ORAL_TABLET | Freq: Every day | ORAL | Status: DC
  Administered 2016-10-22 – 2016-10-24 (×3): 300 mg via ORAL
  Filled 2016-10-22 (×3): qty 1

## 2016-10-22 MED ORDER — ONDANSETRON HCL 4 MG/2ML IJ SOLN: 4.0000 mg | Freq: Four times a day (QID) | INTRAMUSCULAR | Status: DC | PRN

## 2016-10-22 MED ORDER — INSULIN ASPART 100 UNIT/ML ~~LOC~~ SOLN
0.0000 [IU] | Freq: Three times a day (TID) | SUBCUTANEOUS | Status: DC
  Administered 2016-10-22: 7 [IU] via SUBCUTANEOUS
  Administered 2016-10-23 – 2016-10-24 (×4): 1 [IU] via SUBCUTANEOUS

## 2016-10-22 MED ORDER — ACETAMINOPHEN 500 MG PO TABS: 500.0000 mg | ORAL_TABLET | Freq: Four times a day (QID) | ORAL | Status: DC | PRN

## 2016-10-22 MED ORDER — HEPARIN SODIUM (PORCINE) 5000 UNIT/ML IJ SOLN
5000.0000 [IU] | Freq: Three times a day (TID) | INTRAMUSCULAR | Status: DC
  Administered 2016-10-22 – 2016-10-24 (×8): 5000 [IU] via SUBCUTANEOUS
  Filled 2016-10-22 (×8): qty 1

## 2016-10-22 MED ORDER — SODIUM CHLORIDE 0.9% FLUSH: 3.0000 mL | INTRAVENOUS | Status: DC | PRN

## 2016-10-22 MED ORDER — SODIUM CHLORIDE 0.9 % IV SOLN: 250.0000 mL | INTRAVENOUS | Status: DC | PRN

## 2016-10-22 MED ORDER — SODIUM CHLORIDE 0.9% FLUSH
3.0000 mL | Freq: Two times a day (BID) | INTRAVENOUS | Status: DC
  Administered 2016-10-22 – 2016-10-24 (×6): 3 mL via INTRAVENOUS

## 2016-10-22 MED ORDER — CINACALCET HCL 30 MG PO TABS
30.0000 mg | ORAL_TABLET | ORAL | Status: DC
  Administered 2016-10-23: 30 mg via ORAL
  Filled 2016-10-22: qty 1

## 2016-10-22 NOTE — Progress Notes (Signed)
Patient arrived to 34 via ED transport. Patient is a poor historian and unable to complete admission at this time. Patient is alert to self. Disoriented to place, time, and situation. Patient calm and cooperative, falling asleep during initial assessment.

## 2016-10-22 NOTE — Progress Notes (Addendum)
PROGRESS NOTE  [name] DGU:[id number] DOB: 25-Jul-1960 DOA: 10/21/2016 PCP: Patient, No Pcp Per  Brief History:  56 year old female with history of ESRD, diabetes mellitus, bipolar disorder, hypertension, schizophrenia presented with altered mental status. Apparently, the patient had her usual dialysis on 10/20/2016. She was noted to have increasing somnolent since that time for a period over 24 hours. She normally resides in a group home, and the patient was transferred to the emergency department because of increasing somnolence. There are no reports of nausea, vomiting, diarrhea, chest pain. Attempts to contact family were unsuccessful. On 10/22/2016, the patient was arousable and able to converse although remains pleasantly confused.  Assessment/Plan: Acute encephalopathy -Workup in progress -Suspect medication related -ammonia 28 -Check Depakote level -10/21/2016 CT brain negative for acute findings -Check serum B12 -Check TSH -Holding Prolixin, Ativan, InVega, Cogentin, pepcid -Check ABG -Remains confused although unclear baseline mental status -attempts to contact family without success  ESRD -Consult nephrology for her maintenance dialysis on Monday, Wednesday, Friday -Metabolic bone disease per nephrology -Last dialysis 10/20/2016  Diabetes mellitus type 2 -Check hemoglobin A1c -NovoLog sliding scale -Holding glipizide  Bipolar disorder -Continue Wellbutrin -Holding Prolixin, Ativan, Invega secondary to mental status change  Essential hypertension -Blood pressure acceptable presently -Not on any antihypertensive medications in the outpatient setting -Hydralazine when necessary SBP >180   Disposition Plan:   Group home 1-2 days if stable/improving  Family Communication:   No Family at bedside--Total time spent 35 minutes.  Greater than 50% spent face to face counseling and coordinating care.  0655 to 0730   Consultants:  renal  Code Status:   FULL  DVT Prophylaxis:  [place] Heparin   Procedures: As Listed in Progress Note Above  Antibiotics: None    Subjective: Patient remains pleasantly confused. She denies any headache, chest pain, shortness breath, abdominal pain, vomiting. Remainder review of systems unobtainable secondary to contusion.  Objective: Vitals:   10/21/16 2330 10/22/16 0015 10/22/16 0149 10/22/16 0532  BP: (!) 157/80 (!) 120/59 (!) 158/62 140/73  Pulse: 62 61 62 64  Resp: (!) 22 (!) 27  16  Temp:   97.8 F (36.6 C) 98.2 F (36.8 C)  TempSrc:   Oral Oral  SpO2: 99% 100% 100% 100%  Weight:   73.3 kg (161 lb 9.6 oz)   Height:   4\' 9"  (1.448 m)    No intake or output data in the 24 hours ending 10/22/16 0720 Weight change:  Exam:   General:  Pt is alert, follows commands appropriately, not in acute distress  HEENT: No icterus, No thrush, No neck mass, [place]/AT  Cardiovascular: RRR, S1/S2, no rubs, no gallops  Respiratory: CTA bilaterally, no wheezing, no crackles, no rhonchi  Abdomen: Soft/+BS, non tender, non distended, no guarding  Extremities: No edema, No lymphangitis, No petechiae, No rashes, no synovitis  Neuro:  CN II-XII intact, strength 4/5 in RUE, RLE, strength 4/5 LUE, LLE; sensation intact bilateral; no dysmetria; babinski equivocal    Data Reviewed: I have personally reviewed following labs and imaging studies Basic Metabolic Panel:  Recent Labs Lab 10/21/16 2156 10/21/16 2210  NA 134*  --   K 4.0  --   CL 93*  --   CO2 31  --   GLUCOSE 146*  --   BUN 27*  --   CREATININE 6.32*  --   CALCIUM 9.3  --   MG  --  2.6*   Liver Function  Tests:  Recent Labs Lab 10/21/16 2156  AST 15  ALT 14  ALKPHOS 87  BILITOT 0.4  PROT 7.5  ALBUMIN 3.6   No results for input(s): LIPASE, AMYLASE in the last 168 hours.  Recent Labs Lab 10/21/16 2210  AMMONIA 28   Coagulation Profile: No results for input(s): INR, PROTIME in the last 168 hours. CBC:  Recent Labs Lab  10/21/16 2156  WBC 7.0  HGB 10.0*  HCT 29.8*  MCV 92.5  PLT 230   Cardiac Enzymes:  Recent Labs Lab 10/21/16 2210  TROPONINI <0.03   BNP: Invalid input(s): POCBNP CBG:  Recent Labs Lab 10/21/16 2137  GLUCAP 154*   HbA1C: No results for input(s): HGBA1C in the last 72 hours. Urine analysis:    Component Value Date/Time   COLORURINE Yellow 09/17/2012 1205   APPEARANCEUR Clear 09/17/2012 1205   LABSPEC 1.013 09/17/2012 1205   PHURINE 9.0 09/17/2012 1205   GLUCOSEU Negative 09/17/2012 1205   HGBUR Negative 09/17/2012 1205   BILIRUBINUR Negative 09/17/2012 1205   KETONESUR Trace 09/17/2012 1205   PROTEINUR 30 mg/dL 09/17/2012 1205   NITRITE Negative 09/17/2012 1205   LEUKOCYTESUR Negative 09/17/2012 1205   Sepsis Labs: @LABRCNTIP (procalcitonin:4,lacticidven:4) )No results found for this or any previous visit (from the past 240 hour(s)).   Scheduled Meds: . aspirin EC  81 mg Oral Daily  . buPROPion  300 mg Oral Daily  . [START ON 10/23/2016] cinacalcet  30 mg Oral Once per day on Tue Thu Sat  . heparin  5,000 Units Subcutaneous Q8H  . insulin aspart  0-9 Units Subcutaneous TID WC  . sodium chloride flush  3 mL Intravenous Q12H   Continuous Infusions: . sodium chloride      Procedures/Studies: Dg Chest 1 View  Result Date: 10/21/2016 CLINICAL DATA:  Altered mental status after dialysis EXAM: CHEST 1 VIEW COMPARISON:  Chest radiograph 09/17/2012 FINDINGS: Right IJ approach hemodialysis catheter tip overlies the intrahepatic inferior vena cava. Position is approximately unchanged from the study of 09/17/2012 when allowing for technical differences. There is bibasilar atelectasis without other focal consolidation. No pulmonary edema. Heart size is normal. IMPRESSION: No active disease. Electronically Signed   By: Ulyses Jarred M.D.   On: 10/21/2016 23:03   Ct Head Wo Contrast  Result Date: 10/21/2016 CLINICAL DATA:  Acute onset of altered level of consciousness.  Initial encounter. EXAM: CT HEAD WITHOUT CONTRAST TECHNIQUE: Contiguous axial images were obtained from the base of the skull through the vertex without intravenous contrast. COMPARISON:  CT of the head performed 09/17/2012 FINDINGS: Brain: No evidence of acute infarction, hemorrhage, hydrocephalus, extra-axial collection or mass lesion/mass effect. Prominence of the ventricles and sulci reflects moderate cortical volume loss. Mild periventricular white matter change likely reflects small vessel ischemic microangiopathy. The brainstem and fourth ventricle are within normal limits. The basal ganglia are unremarkable in appearance. The cerebral hemispheres demonstrate grossly normal gray-white differentiation. No mass effect or midline shift is seen. Vascular: No hyperdense vessel or unexpected calcification. Skull: There is no evidence of fracture; visualized osseous structures are unremarkable in appearance. Sinuses/Orbits: The visualized portions of the orbits are within normal limits. The paranasal sinuses and mastoid air cells are well-aerated. Other: No significant soft tissue abnormalities are seen. IMPRESSION: 1. No acute intracranial pathology seen on CT. 2. Moderate cortical volume loss and scattered small vessel ischemic microangiopathy. Electronically Signed   By: Garald Balding M.D.   On: 10/21/2016 22:53    Mylia Pondexter, DO  [facility] Pager  [phone]  If 7PM-7AM, please contact night-coverage [email or web address] Password [email or web address] 10/22/2016, 7:20 AM   LOS: 0 days

## 2016-10-22 NOTE — ED Notes (Signed)
No answer from [facility] care center or emergency contact   [name] [phone]

## 2016-10-22 NOTE — H&P (Signed)
TRH H&P    Patient Demographics:    Dyesha Henault, is a 56 y.o. female  MRN: [id number]  DOB - [birth year]  Admit Date - 10/21/2016  Referring MD/NP/PA: Dr. Sabra Heck  Outpatient Primary MD for the patient is Patient, No Pcp Per  Patient coming from: Group home  Chief Complaint  Patient presents with  . Altered Mental Status      HPI:    Everlie Eble  is a 56 y.o. female, With history of end-stage renal disease on dialysis, diabetes mellitus, bipolar disorder on Depakote and other antipsychotic medications. Patient was brought to hospital for altered mental status. History is obtained from the ED notes. As per group home patient was somnolent since she came from dialysis yesterday. At this time patient is awake, but still confused.  She denies any pain. Otherwise unable to provide any significant history.    Review of systems:     Unable to obtain review of systems as patient is confused   With Past History of the following :    Past Medical History:  Diagnosis Date  . Bipolar 1 disorder ([place])   . Diabetes mellitus without complication ([place])   . Hypertension   . Schizophrenia ([place])       No past surgical history on file.    Social History:      Social History  Substance Use Topics  . Smoking status: Unknown If Ever Smoked  . Smokeless tobacco: Current User  . Alcohol use No       Family History :   Unable to obtain family history due to patient's altered mental status   Home Medications:   Prior to Admission medications   Medication Sig Start Date End Date Taking? Authorizing Provider  acetaminophen (TYLENOL) 500 MG tablet Take 500 mg by mouth every 6 (six) hours as needed.   Yes [provider]  aspirin EC 81 MG tablet Take 81 mg by mouth daily.   Yes [provider]  benztropine (COGENTIN) 1 MG tablet Take 1 mg by mouth.   Yes [provider]  buPROPion  (WELLBUTRIN XL) 300 MG 24 hr tablet Take 300 mg by mouth daily.   Yes [provider]  cinacalcet (SENSIPAR) 30 MG tablet Take 30 mg by mouth 3 (three) times a week. Take 30mg  on Tuesday, Thursday, and Saturday   Yes [provider]  divalproex (DEPAKOTE) 500 MG DR tablet Take 500 mg by mouth.   Yes [provider]  docusate sodium (COLACE) 100 MG capsule Take 100 mg by mouth.   Yes [provider]  famotidine (PEPCID) 40 MG tablet Take 40 mg by mouth daily.   Yes [provider]  fluPHENAZine (PROLIXIN) 5 MG tablet Take 5 mg by mouth.   Yes [provider]  glipiZIDE (GLUCOTROL) 5 MG tablet Take 5 mg by mouth.   Yes [provider]  insulin glargine (LANTUS) 100 UNIT/ML injection Inject into the skin.   Yes [provider]  latanoprost (XALATAN) 0.005 % ophthalmic solution 1 drop.  Yes [provider]  LORazepam (ATIVAN) 0.5 MG tablet Take 0.5 mg by mouth 2 (two) times daily. Take at 8:00am and 8:00pm   Yes [provider]  Multiple Vitamin (MULTIVITAMIN WITH MINERALS) TABS tablet Take 1 tablet by mouth daily.   Yes [provider]  paliperidone (INVEGA) 3 MG 24 hr tablet Take 3 mg by mouth.   Yes [provider]  sevelamer carbonate (RENVELA) 800 MG tablet Take 800 mg by mouth.   Yes [provider]     Allergies:    Allergies no known allergies   Physical Exam:   Vitals  Blood pressure (!) 120/59, pulse 61, temperature 97.8 F (36.6 C), temperature source Oral, resp. rate (!) 27, weight 68 kg (150 lb), SpO2 100 %.  1.  General: Appears in no acute distress  2. Psychiatric: Alert, awake, oriented to self only  3. Neurologic: Moving all extremities, no focal deficit noted  4. Eyes :  anicteric sclerae, moist conjunctivae with no lid lag. PERRLA.  5. ENMT:  Oropharynx clear with moist mucous membranes and good dentition  6. Neck:  supple, no cervical  lymphadenopathy appriciated, No thyromegaly  7. Respiratory : Normal respiratory effort, good air movement bilaterally,clear to  auscultation bilaterally  8. Cardiovascular : RRR, no gallops, rubs or murmurs, no leg edema  9. Gastrointestinal:  Positive bowel sounds, abdomen soft, non-tender to palpation,no hepatosplenomegaly, no rigidity or guarding       10. Skin:  No cyanosis, normal texture and turgor, no rash, lesions or ulcers  11.Musculoskeletal:  Good muscle tone,  joints appear normal , no effusions,  normal range of motion    Data Review:    CBC  Recent Labs Lab 10/21/16 2156  WBC 7.0  HGB 10.0*  HCT 29.8*  PLT 230  MCV 92.5  MCH 31.1  MCHC 33.6  RDW 13.8   ------------------------------------------------------------------------------------------------------------------  Chemistries   Recent Labs Lab 10/21/16 2156  NA 134*  K 4.0  CL 93*  CO2 31  GLUCOSE 146*  BUN 27*  CREATININE 6.32*  CALCIUM 9.3  AST 15  ALT 14  ALKPHOS 87  BILITOT 0.4   ------------------------------------------------------------------------------------------------------------------  ------------------------------------------------------------------------------------------------------------------ GFR: CrCl cannot be calculated (Unknown ideal weight.). Liver Function Tests:  Recent Labs Lab 10/21/16 2156  AST 15  ALT 14  ALKPHOS 87  BILITOT 0.4  PROT 7.5  ALBUMIN 3.6   No results for input(s): LIPASE, AMYLASE in the last 168 hours.  Recent Labs Lab 10/21/16 2210  AMMONIA 28   Coagulation Profile: No results for input(s): INR, PROTIME in the last 168 hours. Cardiac Enzymes:  Recent Labs Lab 10/21/16 2210  TROPONINI <0.03   BNP (last 3 results) No results for input(s): PROBNP in the last 8760 hours. HbA1C: No results for input(s): HGBA1C in the last 72 hours. CBG:  Recent Labs Lab 10/21/16 2137  GLUCAP 154*   Lipid Profile: No results for  input(s): CHOL, HDL, LDLCALC, TRIG, CHOLHDL, LDLDIRECT in the last 72 hours. Thyroid Function Tests: No results for input(s): TSH, T4TOTAL, FREET4, T3FREE, THYROIDAB in the last 72 hours. Anemia Panel: No results for input(s): VITAMINB12, FOLATE, FERRITIN, TIBC, IRON, RETICCTPCT in the last 72 hours.  --------------------------------------------------------------------------------------------------------------- Urine analysis:    Component Value Date/Time   COLORURINE Yellow 09/17/2012 1205   APPEARANCEUR Clear 09/17/2012 1205   LABSPEC 1.013 09/17/2012 1205   PHURINE 9.0 09/17/2012 1205   GLUCOSEU Negative 09/17/2012 1205   HGBUR Negative 09/17/2012 1205   BILIRUBINUR Negative 09/17/2012  [place] 09/17/2012 1205   PROTEINUR 30 mg/dL 09/17/2012 1205   NITRITE Negative 09/17/2012 1205   LEUKOCYTESUR Negative 09/17/2012 1205      Imaging Results:    Dg Chest 1 View  Result Date: 10/21/2016 CLINICAL DATA:  Altered mental status after dialysis EXAM: CHEST 1 VIEW COMPARISON:  Chest radiograph 09/17/2012 FINDINGS: Right IJ approach hemodialysis catheter tip overlies the intrahepatic inferior vena cava. Position is approximately unchanged from the study of 09/17/2012 when allowing for technical differences. There is bibasilar atelectasis without other focal consolidation. No pulmonary edema. Heart size is normal. IMPRESSION: No active disease. Electronically Signed   By: Ulyses Jarred M.D.   On: 10/21/2016 23:03   Ct Head Wo Contrast  Result Date: 10/21/2016 CLINICAL DATA:  Acute onset of altered level of consciousness. Initial encounter. EXAM: CT HEAD WITHOUT CONTRAST TECHNIQUE: Contiguous axial images were obtained from the base of the skull through the vertex without intravenous contrast. COMPARISON:  CT of the head performed 09/17/2012 FINDINGS: Brain: No evidence of acute infarction, hemorrhage, hydrocephalus, extra-axial collection or mass lesion/mass effect. Prominence of  the ventricles and sulci reflects moderate cortical volume loss. Mild periventricular white matter change likely reflects small vessel ischemic microangiopathy. The brainstem and fourth ventricle are within normal limits. The basal ganglia are unremarkable in appearance. The cerebral hemispheres demonstrate grossly normal gray-white differentiation. No mass effect or midline shift is seen. Vascular: No hyperdense vessel or unexpected calcification. Skull: There is no evidence of fracture; visualized osseous structures are unremarkable in appearance. Sinuses/Orbits: The visualized portions of the orbits are within normal limits. The paranasal sinuses and mastoid air cells are well-aerated. Other: No significant soft tissue abnormalities are seen. IMPRESSION: 1. No acute intracranial pathology seen on CT. 2. Moderate cortical volume loss and scattered small vessel ischemic microangiopathy. Electronically Signed   By: Garald Balding M.D.   On: 10/21/2016 22:53    My personal review of EKG: Rhythm NSR, QTc 600   Assessment & Plan:    Active Problems:   Altered mental status   ESRD (end stage renal disease) ([place])   Bipolar 1 disorder ([facility])   Diabetes mellitus ([place])   Essential hypertension   1. Altered mental status- unclear etiology, patient is more awake at this time. Unsure of patient's baseline. Tried to call group home but no reply at this time. We'll place under observation. 2. QTc prolonged- EKG shows QTc prolonged to 600, will check serum magnesium. Monitor patient on telemetry. Repeat EKG in a.m. 3. Diabetes mellitus- hold Glucotrol, start sliding scale insulin with NovoLog. 4. Bipolar disorder- patient on multiple psychotropic medications, will hold Depakote, Cogentin, Prolixin. 5. ESRD on hemodialysis-patient gets dialysis Monday Wednesday Friday. Last dialysis was Monday. Will consult nephrology in a.m. for dialysis. 6. Med reconciliation- med reconciliation is not complete, Doses of  medications were not sent by group home. Will need to  consult pharmacy in a.m. for complete med reconciliation.   DVT Prophylaxis-   Heparin  AM Labs Ordered, also please review Full Orders  Family Communication: No family at bedside  Code Status:  Full code, presumed  Admission status: Observation    Time spent in minutes : 55 minutes   Timm Bonenberger S M.D on 10/22/2016 at 1:21 AM  Between 7am to 7pm - Pager - [phone]. After 7pm go to [email or web address] - password [facility]  [facility] - Office  [phone]

## 2016-10-23 ENCOUNTER — Inpatient Hospital Stay (HOSPITAL_COMMUNITY): Payer: Medicaid Other

## 2016-10-23 LAB — CBC
HCT: 34.5 % — ABNORMAL LOW (ref 36.0–46.0)
HEMOGLOBIN: 11.6 g/dL — AB (ref 12.0–15.0)
MCH: 31 pg (ref 26.0–34.0)
MCHC: 33.6 g/dL (ref 30.0–36.0)
MCV: 92.2 fL (ref 78.0–100.0)
Platelets: 261 10*3/uL (ref 150–400)
RBC: 3.74 MIL/uL — AB (ref 3.87–5.11)
RDW: 13.9 % (ref 11.5–15.5)
WBC: 6.9 10*3/uL (ref 4.0–10.5)

## 2016-10-23 LAB — RAPID URINE DRUG SCREEN, HOSP PERFORMED
AMPHETAMINES: NOT DETECTED
Barbiturates: NOT DETECTED
Benzodiazepines: POSITIVE — AB
COCAINE: NOT DETECTED
OPIATES: NOT DETECTED
TETRAHYDROCANNABINOL: NOT DETECTED

## 2016-10-23 LAB — GLUCOSE, CAPILLARY
GLUCOSE-CAPILLARY: 144 mg/dL — AB (ref 65–99)
Glucose-Capillary: 143 mg/dL — ABNORMAL HIGH (ref 65–99)
Glucose-Capillary: 103 mg/dL — ABNORMAL HIGH (ref 65–99)
Glucose-Capillary: 220 mg/dL — ABNORMAL HIGH (ref 65–99)

## 2016-10-23 LAB — RENAL FUNCTION PANEL
ANION GAP: 12 (ref 5–15)
Albumin: 3.5 g/dL (ref 3.5–5.0)
Albumin: 3.6 g/dL (ref 3.5–5.0)
Anion gap: 10 (ref 5–15)
BUN: 35 mg/dL — AB (ref 6–20)
BUN: 38 mg/dL — ABNORMAL HIGH (ref 6–20)
CALCIUM: 9.4 mg/dL (ref 8.9–10.3)
CALCIUM: 8.8 mg/dL — AB (ref 8.9–10.3)
CO2: 28 mmol/L (ref 22–32)
CO2: 24 mmol/L (ref 22–32)
Chloride: 98 mmol/L — ABNORMAL LOW (ref 101–111)
Chloride: 97 mmol/L — ABNORMAL LOW (ref 101–111)
Creatinine, Ser: 7.52 mg/dL — ABNORMAL HIGH (ref 0.44–1.00)
Creatinine, Ser: 8.04 mg/dL — ABNORMAL HIGH (ref 0.44–1.00)
GFR calc Af Amer: 6 mL/min — ABNORMAL LOW (ref >60)
GFR calc non Af Amer: 5 mL/min — ABNORMAL LOW (ref >60)
GFR, EST AFRICAN AMERICAN: 6 mL/min — AB (ref >60)
GFR, EST NON AFRICAN AMERICAN: 5 mL/min — AB (ref >60)
GLUCOSE: 151 mg/dL — AB (ref 65–99)
Glucose, Bld: 222 mg/dL — ABNORMAL HIGH (ref 65–99)
Phosphorus: 3 mg/dL (ref 2.5–4.6)
Phosphorus: 3.1 mg/dL (ref 2.5–4.6)
Potassium: 5 mmol/L (ref 3.5–5.1)
Potassium: 4.6 mmol/L (ref 3.5–5.1)
SODIUM: 136 mmol/L (ref 135–145)
SODIUM: 133 mmol/L — AB (ref 135–145)

## 2016-10-23 LAB — HEMOGLOBIN A1C
HEMOGLOBIN A1C: 6.5 % — AB (ref 4.8–5.6)
Mean Plasma Glucose: 140 mg/dL

## 2016-10-23 LAB — MRSA PCR SCREENING: MRSA by PCR: NEGATIVE

## 2016-10-23 MED ORDER — HEPARIN SODIUM (PORCINE) 1000 UNIT/ML DIALYSIS
1000.0000 [IU] | INTRAMUSCULAR | Status: DC | PRN
  Filled 2016-10-23: qty 1

## 2016-10-23 MED ORDER — LIDOCAINE HCL (PF) 1 % IJ SOLN: 5.0000 mL | INTRAMUSCULAR | Status: DC | PRN

## 2016-10-23 MED ORDER — PENTAFLUOROPROP-TETRAFLUOROETH EX AERO: 1.0000 "application " | INHALATION_SPRAY | CUTANEOUS | Status: DC | PRN

## 2016-10-23 MED ORDER — ALTEPLASE 2 MG IJ SOLR
2.0000 mg | Freq: Once | INTRAMUSCULAR | Status: DC | PRN
  Filled 2016-10-23: qty 2

## 2016-10-23 MED ORDER — LORAZEPAM 2 MG/ML IJ SOLN
2.0000 mg | Freq: Once | INTRAMUSCULAR | Status: AC
  Administered 2016-10-23: 2 mg via INTRAVENOUS
  Filled 2016-10-23: qty 1

## 2016-10-23 MED ORDER — SODIUM CHLORIDE 0.9 % IV SOLN: 100.0000 mL | INTRAVENOUS | Status: DC | PRN

## 2016-10-23 MED ORDER — DIVALPROEX SODIUM 125 MG PO CSDR
250.0000 mg | DELAYED_RELEASE_CAPSULE | Freq: Two times a day (BID) | ORAL | Status: DC
  Administered 2016-10-23 – 2016-10-24 (×3): 250 mg via ORAL
  Filled 2016-10-23 (×9): qty 2

## 2016-10-23 MED ORDER — LIDOCAINE-PRILOCAINE 2.5-2.5 % EX CREA: 1.0000 "application " | TOPICAL_CREAM | CUTANEOUS | Status: DC | PRN

## 2016-10-23 NOTE — Consult Note (Signed)
Reason for Consult: End-stage renal disease for dialysis Referring Physician: Dr. Shanon Brow Tat  [name] is an 56 y.o. female.  HPI: She is a patient who has history of diabetes, hypertension, bipolar disorder, schizophrenia, end-stage renal disease on maintenance hemodialysis presently was brought to emergency room on 10/21/16 because of altered mental status. Presently patient seems to be somewhat alert but remained disoriented to time and place. She doesn't seem to have any complaints.  Past Medical History:  Diagnosis Date  . Bipolar 1 disorder ([place])   . Diabetes mellitus without complication ([place])   . Hypertension   . Schizophrenia ([place])     No past surgical history on file.  No family history on file.  Social History:  has an unknown smoking status. She uses smokeless tobacco. She reports that she does not drink alcohol or use drugs.  Allergies: Allergies no known allergies  Medications: I have reviewed the patient's current medications.  Results for orders placed or performed during the hospital encounter of 10/21/16 (from the past 48 hour(s))  CBG monitoring, ED     Status: Abnormal   Collection Time: 10/21/16  9:37 PM  Result Value Ref Range   Glucose-Capillary 154 (H) 65 - 99 mg/dL  Comprehensive metabolic panel     Status: Abnormal   Collection Time: 10/21/16  9:56 PM  Result Value Ref Range   Sodium 134 (L) 135 - 145 mmol/L   Potassium 4.0 3.5 - 5.1 mmol/L   Chloride 93 (L) 101 - 111 mmol/L   CO2 31 22 - 32 mmol/L   Glucose, Bld 146 (H) 65 - 99 mg/dL   BUN 27 (H) 6 - 20 mg/dL   Creatinine, Ser 6.32 (H) 0.44 - 1.00 mg/dL   Calcium 9.3 8.9 - 10.3 mg/dL   Total Protein 7.5 6.5 - 8.1 g/dL   Albumin 3.6 3.5 - 5.0 g/dL   AST 15 15 - 41 U/L   ALT 14 14 - 54 U/L   Alkaline Phosphatase 87 38 - 126 U/L   Total Bilirubin 0.4 0.3 - 1.2 mg/dL   GFR calc non Af Amer 7 (L) >60 mL/min   GFR calc Af Amer 8 (L) >60 mL/min    Comment: (NOTE) The eGFR has been calculated using  the CKD EPI equation. This calculation has not been validated in all clinical situations. eGFR's persistently <60 mL/min signify possible Chronic Kidney Disease.    Anion gap 10 5 - 15  CBC     Status: Abnormal   Collection Time: 10/21/16  9:56 PM  Result Value Ref Range   WBC 7.0 4.0 - 10.5 K/uL   RBC 3.22 (L) 3.87 - 5.11 MIL/uL   Hemoglobin 10.0 (L) 12.0 - 15.0 g/dL   HCT 29.8 (L) 36.0 - 46.0 %   MCV 92.5 78.0 - 100.0 fL   MCH 31.1 26.0 - 34.0 pg   MCHC 33.6 30.0 - 36.0 g/dL   RDW 13.8 11.5 - 15.5 %   Platelets 230 150 - 400 K/uL  HIV antibody (Routine Testing)     Status: None   Collection Time: 10/21/16  9:56 PM  Result Value Ref Range   HIV Screen 4th Generation wRfx Non Reactive Non Reactive    Comment: (NOTE) Performed At: [facility] [address] [place], [place] [id number] Lindon Romp MD [id number]   Ammonia     Status: None   Collection Time: 10/21/16 10:10 PM  Result Value Ref Range   Ammonia 28 9 -  35 umol/L  Troponin I     Status: None   Collection Time: 10/21/16 10:10 PM  Result Value Ref Range   Troponin I <0.03 <0.03 ng/mL  Magnesium     Status: Abnormal   Collection Time: 10/21/16 10:10 PM  Result Value Ref Range   Magnesium 2.6 (H) 1.7 - 2.4 mg/dL  Hemoglobin A1c     Status: Abnormal   Collection Time: 10/21/16 10:10 PM  Result Value Ref Range   Hgb A1c MFr Bld 6.5 (H) 4.8 - 5.6 %    Comment: (NOTE)         Pre-diabetes: 5.7 - 6.4         Diabetes: >6.4         Glycemic control for adults with diabetes: <7.0    Mean Plasma Glucose 140 mg/dL    Comment: (NOTE) Performed At: [facility] [place], [place] [id number] Lindon Romp MD [id number]   CBC     Status: Abnormal   Collection Time: 10/22/16  7:43 AM  Result Value Ref Range   WBC 5.4 4.0 - 10.5 K/uL   RBC 3.59 (L) 3.87 - 5.11 MIL/uL   Hemoglobin 11.1 (L) 12.0 - 15.0 g/dL   HCT 33.3 (L) 36.0 - 46.0 %   MCV 92.8 78.0 - 100.0 fL   MCH 30.9  26.0 - 34.0 pg   MCHC 33.3 30.0 - 36.0 g/dL   RDW 13.9 11.5 - 15.5 %   Platelets 221 150 - 400 K/uL  Comprehensive metabolic panel     Status: Abnormal   Collection Time: 10/22/16  7:43 AM  Result Value Ref Range   Sodium 138 135 - 145 mmol/L   Potassium 4.2 3.5 - 5.1 mmol/L   Chloride 97 (L) 101 - 111 mmol/L   CO2 31 22 - 32 mmol/L   Glucose, Bld 93 65 - 99 mg/dL   BUN 28 (H) 6 - 20 mg/dL   Creatinine, Ser 6.73 (H) 0.44 - 1.00 mg/dL   Calcium 9.5 8.9 - 10.3 mg/dL   Total Protein 7.6 6.5 - 8.1 g/dL   Albumin 3.6 3.5 - 5.0 g/dL   AST 18 15 - 41 U/L   ALT 14 14 - 54 U/L   Alkaline Phosphatase 90 38 - 126 U/L   Total Bilirubin 0.6 0.3 - 1.2 mg/dL   GFR calc non Af Amer 6 (L) >60 mL/min   GFR calc Af Amer 7 (L) >60 mL/min    Comment: (NOTE) The eGFR has been calculated using the CKD EPI equation. This calculation has not been validated in all clinical situations. eGFR's persistently <60 mL/min signify possible Chronic Kidney Disease.    Anion gap 10 5 - 15  Valproic acid level     Status: Abnormal   Collection Time: 10/22/16  7:43 AM  Result Value Ref Range   Valproic Acid Lvl 25 (L) 50.0 - 100.0 ug/mL  Vitamin B12     Status: Abnormal   Collection Time: 10/22/16  7:43 AM  Result Value Ref Range   Vitamin B-12 973 (H) 180 - 914 pg/mL    Comment: (NOTE) This assay is not validated for testing neonatal or myeloproliferative syndrome specimens for Vitamin B12 levels. Performed at [facility], [place] [address] [place], [place] [address]   TSH     Status: None   Collection Time: 10/22/16  7:43 AM  Result Value Ref Range   TSH 2.331 0.350 - 4.500 uIU/mL  Comment: Performed by a 3rd Generation assay with a functional sensitivity of <=0.01 uIU/mL.  T4, free     Status: None   Collection Time: 10/22/16  7:43 AM  Result Value Ref Range   Free T4 0.87 0.61 - 1.12 ng/dL    Comment: (NOTE) Biotin ingestion may interfere with free T4 tests. If the results are inconsistent  with the TSH level, previous test results, or the clinical presentation, then consider biotin interference. If needed, order repeat testing after stopping biotin. Performed at [facility], [place] [address] [place], [place] [address]   Glucose, capillary     Status: None   Collection Time: 10/22/16  7:49 AM  Result Value Ref Range   Glucose-Capillary 93 65 - 99 mg/dL   Comment 1 Notify RN   Blood gas, arterial     Status: Abnormal   Collection Time: 10/22/16  8:00 AM  Result Value Ref Range   FIO2 0.21    pH, Arterial 7.399 7.350 - 7.450   pCO2 arterial 50.7 (H) 32.0 - 48.0 mmHg   pO2, Arterial 81.2 (L) 83.0 - 108.0 mmHg   Bicarbonate 29.3 (H) 20.0 - 28.0 mmol/L   Acid-Base Excess 6.0 (H) 0.0 - 2.0 mmol/L   O2 Saturation 95.9 %   Patient temperature 37.0    Collection site LEFT BRACHIAL    Drawn by [id number]    Sample type ARTERIAL DRAW    Allens test (pass/fail) PASS PASS  Glucose, capillary     Status: None   Collection Time: 10/22/16 11:39 AM  Result Value Ref Range   Glucose-Capillary 84 65 - 99 mg/dL  Glucose, capillary     Status: Abnormal   Collection Time: 10/22/16  4:51 PM  Result Value Ref Range   Glucose-Capillary 308 (H) 65 - 99 mg/dL   Comment 1 Notify RN    Comment 2 Document in Chart   Glucose, capillary     Status: Abnormal   Collection Time: 10/22/16  9:19 PM  Result Value Ref Range   Glucose-Capillary 140 (H) 65 - 99 mg/dL   Comment 1 Notify RN    Comment 2 Document in Chart   MRSA PCR Screening     Status: None   Collection Time: 10/23/16  3:35 AM  Result Value Ref Range   MRSA by PCR NEGATIVE NEGATIVE    Comment:        The GeneXpert MRSA Assay (FDA approved for NASAL specimens only), is one component of a comprehensive MRSA colonization surveillance program. It is not intended to diagnose MRSA infection nor to guide or monitor treatment for MRSA infections.   Glucose, capillary     Status: Abnormal   Collection Time: 10/23/16  7:44 AM   Result Value Ref Range   Glucose-Capillary 143 (H) 65 - 99 mg/dL  Renal function panel     Status: Abnormal   Collection Time: 10/23/16  7:57 AM  Result Value Ref Range   Sodium 136 135 - 145 mmol/L   Potassium 5.0 3.5 - 5.1 mmol/L   Chloride 98 (L) 101 - 111 mmol/L   CO2 28 22 - 32 mmol/L   Glucose, Bld 151 (H) 65 - 99 mg/dL   BUN 35 (H) 6 - 20 mg/dL   Creatinine, Ser 7.52 (H) 0.44 - 1.00 mg/dL   Calcium 9.4 8.9 - 10.3 mg/dL   Phosphorus 3.0 2.5 - 4.6 mg/dL   Albumin 3.5 3.5 - 5.0 g/dL   GFR calc non Af [place] Lora  5 (L) >60 mL/min   GFR calc Af Amer 6 (L) >60 mL/min    Comment: (NOTE) The eGFR has been calculated using the CKD EPI equation. This calculation has not been validated in all clinical situations. eGFR's persistently <60 mL/min signify possible Chronic Kidney Disease.    Anion gap 10 5 - 15  Glucose, capillary     Status: Abnormal   Collection Time: 10/23/16 11:49 AM  Result Value Ref Range   Glucose-Capillary 144 (H) 65 - 99 mg/dL    Dg Chest 1 View  Result Date: 10/21/2016 CLINICAL DATA:  Altered mental status after dialysis EXAM: CHEST 1 VIEW COMPARISON:  Chest radiograph 09/17/2012 FINDINGS: Right IJ approach hemodialysis catheter tip overlies the intrahepatic inferior vena cava. Position is approximately unchanged from the study of 09/17/2012 when allowing for technical differences. There is bibasilar atelectasis without other focal consolidation. No pulmonary edema. Heart size is normal. IMPRESSION: No active disease. Electronically Signed   By: Ulyses Jarred M.D.   On: 10/21/2016 23:03   Ct Head Wo Contrast  Result Date: 10/21/2016 CLINICAL DATA:  Acute onset of altered level of consciousness. Initial encounter. EXAM: CT HEAD WITHOUT CONTRAST TECHNIQUE: Contiguous axial images were obtained from the base of the skull through the vertex without intravenous contrast. COMPARISON:  CT of the head performed 09/17/2012 FINDINGS: Brain: No evidence of acute infarction,  hemorrhage, hydrocephalus, extra-axial collection or mass lesion/mass effect. Prominence of the ventricles and sulci reflects moderate cortical volume loss. Mild periventricular white matter change likely reflects small vessel ischemic microangiopathy. The brainstem and fourth ventricle are within normal limits. The basal ganglia are unremarkable in appearance. The cerebral hemispheres demonstrate grossly normal gray-white differentiation. No mass effect or midline shift is seen. Vascular: No hyperdense vessel or unexpected calcification. Skull: There is no evidence of fracture; visualized osseous structures are unremarkable in appearance. Sinuses/Orbits: The visualized portions of the orbits are within normal limits. The paranasal sinuses and mastoid air cells are well-aerated. Other: No significant soft tissue abnormalities are seen. IMPRESSION: 1. No acute intracranial pathology seen on CT. 2. Moderate cortical volume loss and scattered small vessel ischemic microangiopathy. Electronically Signed   By: Garald Balding M.D.   On: 10/21/2016 22:53    Review of Systems  Reason unable to perform ROS: Confusion.   Blood pressure (!) 185/77, pulse 69, temperature 97.8 F (36.6 C), temperature source Oral, resp. rate 17, height _0  (1.448 m), weight 73.3 kg (161 lb 9.6 oz), SpO2 100 %. Physical Exam  Constitutional: No distress.  Eyes: No scleral icterus.  Neck: No JVD present.  Cardiovascular: Normal rate and regular rhythm.   Respiratory: No respiratory distress. She has no wheezes. She has no rales.  Neurological: She is alert.  Patient presently doesn't know she is in the hospital. She told me so much struggle Saturday dialysis days are Monday Wednesday and Friday. She states she had had dialysis yesterday but she was in the hospital here and didn't get dialysis. Unable to get more information and at this moment not sure what her baseline is.    Assessment/Plan: Problem #1 altered mental status:  Etiology not clear. Patient seems to be somewhat alert. Doesn't know very well her dialysis days and she doesn't know she is in the hospital. Does not answer how many hours she get dialysis and how long she has been on dialysis. Problem #2 end-stage renal disease: Possibly her last dialysis according to the note was the day she came to the emergency  room which is on 7/318 Tuesday hence patient is due for today. Presently her potassium is normal. Problem #3 hypertension: Her blood pressure seems to be high Problem #4 diabetes Problem #5 anemia: Her hemoglobin is within our target goal Problem #6 history of bipolar disorder/schizophrenia Problem #7 bone and mineral disorder: Her calcium and phosphorus is range. Patient was on a binder as an outpatient. Presently is not started on binder. Patient however is on Sensipar. Plan: We'll make arrangements for patient to get dialysis today for 4 hours 2] We will use 2K/2.5 calcium bath and remove about 2 L his systolic blood pressure remains above 90 3] we'll check her renal panel in the morning.  Marsden Zaino S 10/23/2016, 1:02 PM

## 2016-10-23 NOTE — Progress Notes (Signed)
PROGRESS NOTE  SPARROW SIRACUSA WOE:[id number] DOB: August 02, 1960 DOA: 10/21/2016 PCP: [name], No Pcp Per  Brief History:  56 year old female with history of ESRD, diabetes mellitus, bipolar disorder, hypertension, schizophrenia presented with altered mental status. Apparently, the [name] had her usual dialysis on 10/20/2016. She was noted to have increasing somnolent since that time for a period over 24 hours. She normally resides in a group home, and the [name] was transferred to the emergency department because of increasing somnolence. There are no reports of nausea, vomiting, diarrhea, chest pain. Attempts to contact family were unsuccessful. On 10/22/2016, the [name] was arousable and able to converse although remains pleasantly confused.  Assessment/Plan: Acute encephalopathy -Workup for reversible causes unrevelaing -Suspect medication related -10/23/16--more alert, conversant, able to ambulate -ammonia 28 -Check Depakote level--25 -10/21/2016 CT brain negative for acute findings -Check serum B12--973 -Check TSH--2.331 -Holding Prolixin, Ativan, InVega, Cogentin, pepcid -Check ABG--7.399/50/81/29 -Remains confused although unclear baseline mental status -attempts to contact family without success  ESRD -Consult nephrology for her maintenance dialysis on Monday, Wednesday, Friday -Metabolic bone disease per nephrology -Last dialysis 10/20/2016  Diabetes mellitus type 2 -Check hemoglobin A1c--6.5 -NovoLog sliding scale -Holding glipizide  Bipolar disorder -Continue Wellbutrin -Holding Prolixin, Ativan, Invega secondary to mental status change  Essential hypertension -Blood pressure acceptable presently -Not on any antihypertensive medications in the outpatient setting -Hydralazine when necessary SBP >180   Disposition Plan:   Group home 1-2 days if stable/improving  Family Communication:   No Family at bedside   Consultants:  renal  Code Status:   FULL  DVT Prophylaxis:  [place] Heparin   Procedures: As Listed in Progress Note Above  Antibiotics: None    Subjective: [name] denies fevers, chills, headache, chest pain, dyspnea, nausea, vomiting, diarrhea, abdominal pain, dysuria.  She is able to tell me she gets dialysis on MWF   Objective: Vitals:   10/22/16 2114 10/22/16 2230 10/23/16 0441 10/23/16 0819  BP: (!) 194/66 (!) 160/72 (!) 151/70 (!) 185/77  Pulse: 64  84 69  Resp: 16  18 17   Temp: 97.7 F (36.5 C)  98 F (36.7 C) 97.8 F (36.6 C)  TempSrc: Oral  Oral Oral  SpO2: 100%  100% 100%  Weight:      Height:        Intake/Output Summary (Last 24 hours) at 10/23/16 1002 Last data filed at 10/22/16 1944  Gross per 24 hour  Intake              360 ml  Output                0 ml  Net              360 ml   Weight change:  Exam:   General:  Pt is alert, follows commands appropriately, not in acute distress  HEENT: No icterus, No thrush, No neck mass, [place]/AT  Cardiovascular: RRR, S1/S2, no rubs, no gallops  Respiratory: CTA bilaterally, no wheezing, no crackles, no rhonchi  Abdomen: Soft/+BS, non tender, non distended, no guarding  Extremities: No edema, No lymphangitis, No petechiae, No rashes, no synovitis   Data Reviewed: I have personally reviewed following labs and imaging studies Basic Metabolic Panel:  Recent Labs Lab 10/21/16 2156 10/21/16 2210 10/22/16 0743 10/23/16 0757  NA 134*  --  138 136  K 4.0  --  4.2 5.0  CL 93*  --  97* 98*  CO2 31  --  31 28  GLUCOSE 146*  --  93 151*  BUN 27*  --  28* 35*  CREATININE 6.32*  --  6.73* 7.52*  CALCIUM 9.3  --  9.5 9.4  MG  --  2.6*  --   --   PHOS  --   --   --  3.0   Liver Function Tests:  Recent Labs Lab 10/21/16 2156 10/22/16 0743 10/23/16 0757  AST 15 18  --   ALT 14 14  --   ALKPHOS 87 90  --   BILITOT 0.4 0.6  --   PROT 7.5 7.6  --   ALBUMIN 3.6 3.6 3.5   No results for input(s): LIPASE, AMYLASE in the last 168  hours.  Recent Labs Lab 10/21/16 2210  AMMONIA 28   Coagulation Profile: No results for input(s): INR, PROTIME in the last 168 hours. CBC:  Recent Labs Lab 10/21/16 2156 10/22/16 0743  WBC 7.0 5.4  HGB 10.0* 11.1*  HCT 29.8* 33.3*  MCV 92.5 92.8  PLT 230 221   Cardiac Enzymes:  Recent Labs Lab 10/21/16 2210  TROPONINI <0.03   BNP: Invalid input(s): POCBNP CBG:  Recent Labs Lab 10/21/16 2137 10/22/16 0749 10/22/16 1139 10/22/16 1651 10/22/16 2119  GLUCAP 154* 93 84 308* 140*   HbA1C:  Recent Labs  10/21/16 2210  HGBA1C 6.5*   Urine analysis:    Component Value Date/Time   COLORURINE Yellow 09/17/2012 1205   APPEARANCEUR Clear 09/17/2012 1205   LABSPEC 1.013 09/17/2012 1205   PHURINE 9.0 09/17/2012 1205   GLUCOSEU Negative 09/17/2012 1205   HGBUR Negative 09/17/2012 1205   BILIRUBINUR Negative 09/17/2012 1205   KETONESUR Trace 09/17/2012 1205   PROTEINUR 30 mg/dL 09/17/2012 1205   NITRITE Negative 09/17/2012 1205   LEUKOCYTESUR Negative 09/17/2012 1205   Sepsis Labs: @LABRCNTIP (procalcitonin:4,lacticidven:4) ) Recent Results (from the past 240 hour(s))  MRSA PCR Screening     Status: None   Collection Time: 10/23/16  3:35 AM  Result Value Ref Range Status   MRSA by PCR NEGATIVE NEGATIVE Final    Comment:        The GeneXpert MRSA Assay (FDA approved for NASAL specimens only), is one component of a comprehensive MRSA colonization surveillance program. It is not intended to diagnose MRSA infection nor to guide or monitor treatment for MRSA infections.      Scheduled Meds: . aspirin EC  81 mg Oral Daily  . buPROPion  300 mg Oral Daily  . cinacalcet  30 mg Oral Once per day on Tue Thu Sat  . divalproex  500 mg Oral Daily  . heparin  5,000 Units Subcutaneous Q8H  . insulin aspart  0-9 Units Subcutaneous TID WC  . sodium chloride flush  3 mL Intravenous Q12H   Continuous Infusions: . sodium chloride      Procedures/Studies: Dg  Chest 1 View  Result Date: 10/21/2016 CLINICAL DATA:  Altered mental status after dialysis EXAM: CHEST 1 VIEW COMPARISON:  Chest radiograph 09/17/2012 FINDINGS: Right IJ approach hemodialysis catheter tip overlies the intrahepatic inferior vena cava. Position is approximately unchanged from the study of 09/17/2012 when allowing for technical differences. There is bibasilar atelectasis without other focal consolidation. No pulmonary edema. Heart size is normal. IMPRESSION: No active disease. Electronically Signed   By: Ulyses Jarred M.D.   On: 10/21/2016 23:03   Ct Head Wo Contrast  Result Date: 10/21/2016 CLINICAL DATA:  Acute onset of altered level of consciousness. Initial encounter. EXAM: CT HEAD  WITHOUT CONTRAST TECHNIQUE: Contiguous axial images were obtained from the base of the skull through the vertex without intravenous contrast. COMPARISON:  CT of the head performed 09/17/2012 FINDINGS: Brain: No evidence of acute infarction, hemorrhage, hydrocephalus, extra-axial collection or mass lesion/mass effect. Prominence of the ventricles and sulci reflects moderate cortical volume loss. Mild periventricular white matter change likely reflects small vessel ischemic microangiopathy. The brainstem and fourth ventricle are within normal limits. The basal ganglia are unremarkable in appearance. The cerebral hemispheres demonstrate grossly normal gray-white differentiation. No mass effect or midline shift is seen. Vascular: No hyperdense vessel or unexpected calcification. Skull: There is no evidence of fracture; visualized osseous structures are unremarkable in appearance. Sinuses/Orbits: The visualized portions of the orbits are within normal limits. The paranasal sinuses and mastoid air cells are well-aerated. Other: No significant soft tissue abnormalities are seen. IMPRESSION: 1. No acute intracranial pathology seen on CT. 2. Moderate cortical volume loss and scattered small vessel ischemic microangiopathy.  Electronically Signed   By: Garald Balding M.D.   On: 10/21/2016 22:53    Donica Derouin, DO  [facility] Pager [phone]  If 7PM-7AM, please contact night-coverage [email or web address] Password [email or web address] 10/23/2016, 10:02 AM   LOS: 1 day

## 2016-10-23 NOTE — Progress Notes (Addendum)
LCSW consulted as patient is admitted from [place] has called every phone number in patient's chart and reviewed old records and still cannot find a working number for the group home. Numbers listed online and with SW documents are nonworking.  Emergency contact is not answering.  Call placed to [place] (Adult care home specialist) Vonda Antigua  [phone] Who is familiar with patient and placed patient many years ago at [facility] family care home.  Reports he is not the guardian, but he is not sure who the guardian of patient is, but thinks it is her father.  Numbers given to follow up per Timmothy Sours: [phone] Tommie Sams Ray) Administrator of Facility  [phone]  (Rings busy)  [phone]  (wrong group home, this one is the [place], but the sister facility)   [place] at group home finally answered   MWF Dialysis Center:  Unknown but group home feels it is in [place] and states to follow up with Lavada Mesi (who makes decisions for patient per facility).  Reports she has a brother:  Hang Ammon:  [phone].  Unable to reach or leave message due to mailbox not set up.   Group home reports they called the EMS because patient was very confused, lethargic and putting her clothes on backwards.  Patient at baseline is very talkative, engaged, and likes to walk around.  She is independent with ADLs at the facility with no barriers.  Reports her psychiatrist is Dr. Junie Bame, MD  (unknown which practice location at this time)  Plan: Await call back from administrator.  May make APS report as patient does not have a legal decision maker and no family at this time.  Will discuss with Timmothy Sours who is familiar with patient per discussion.  2:22 PM  Call placed to [facility] again to discuss next steps with patient as facility is not calling back or assisting with information that is now interfering with care.  Patient is not at baseline and cannot  consent to treat at this time.  Message left for [facility]. Update MD regarding contact information. Return to family care home once medically stable.  Lane Hacker, MSW Clinical Social Work: Printmaker Coverage for :  [phone]

## 2016-10-23 NOTE — Progress Notes (Signed)
Spoke with dialysis nurse at [facility] [phone]) regarding patient's mental status and dialysis status. Nurse told me that patient has been dialyzing with the center for approximately 4 years and normally is mentally aware enough to sign for herself. This information was relayed to nephrologist and hospitalist.

## 2016-10-23 NOTE — Evaluation (Signed)
Physical Therapy Evaluation Patient Details Name: [name] MRN: [id number] DOB: Dec 08, 1960 Today's Date: 10/23/2016   History of Present Illness  [name] is a 56yo black female who comes to [facility] on 7/4 from group home after noted increase in lethargy/somnolence s/p return from HD. PMH: ESRD on HD, DM, bipolar. Detailed background information about the patient is limited at this time due to difficulty getting in touch with care givers, family, etc, and continued cognitive defcitis with patient.   Clinical Impression  Pt admitted with above diagnosis. Pt currently with functional limitations due to the deficits listed below (see "PT Problem List"). Upon entry, the patient is received asleep in bed, no family/caregiver present. The pt is easily roused and participatory. No acute distress noted at this time. Pt is oriented to self only. All functional mobility is performed at supervision level of assistance or less, 400+ft AMB without patent LOB, although with mild global ataxia, suspected to be related to drowsy state, however no baseline determined. Pt will have adequate assistance and dupervision upon return to group home. PT recommending daily AMB in hallway, without AD, with contact guard assist with nursing while admitted to maintain current functional level. No additional skilled PT services needed at this time. PT signing off.     Follow Up Recommendations Home health PT    Equipment Recommendations  None recommended by PT    Recommendations for Other Services       Precautions / Restrictions Precautions Precautions: Fall Restrictions Weight Bearing Restrictions: No      Mobility  Bed Mobility Overal bed mobility: Independent                Transfers Overall transfer level: Needs assistance Equipment used: 1 person hand held assist Transfers: Sit to/from Stand Sit to Stand: Min guard            Ambulation/Gait Ambulation/Gait assistance: Min guard Ambulation  Distance (Feet): 400 Feet Assistive device: None       General Gait Details: appears relatively stable, mildly ataxic.   Stairs            Wheelchair Mobility    Modified Rankin (Stroke Patients Only)       Balance Overall balance assessment: No apparent balance deficits (not formally assessed);Modified Independent                                           Pertinent Vitals/Pain Pain Assessment: No/denies pain    Home Living Family/patient expects to be discharged to:: Group home                 Additional Comments: long-time group home resident     Prior Function Level of Independence: Needs assistance (limited at this time. )      ADL's / Homemaking Assistance Needed: typically supervision to [facility] for self-care.         Hand Dominance        Extremity/Trunk Assessment   Upper Extremity Assessment Upper Extremity Assessment: Overall WFL for tasks assessed    Lower Extremity Assessment Lower Extremity Assessment: Overall WFL for tasks assessed       Communication      Cognition Arousal/Alertness: Lethargic Behavior During Therapy: Flat affect Overall Cognitive Status: No family/caregiver present to determine baseline cognitive functioning (oriented to self only, delayed response time. )  General Comments: Presents similar to patients with mild ID.       General Comments      Exercises     Assessment/Plan    PT Assessment Patent does not need any further PT services;All further PT needs can be met in the next venue of care  PT Problem List Decreased coordination;Decreased cognition;Decreased knowledge of precautions;Obesity       PT Treatment Interventions      PT Goals (Current goals can be found in the Care Plan section)  Acute Rehab PT Goals PT Goal Formulation: All assessment and education complete, DC therapy    Frequency     Barriers to discharge         Co-evaluation               AM-PAC PT "6 Clicks" Daily Activity  Outcome Measure Difficulty turning over in bed (including adjusting bedclothes, sheets and blankets)?: A Little Difficulty moving from lying on back to sitting on the side of the bed? : A Little Difficulty sitting down on and standing up from a chair with arms (e.g., wheelchair, bedside commode, etc,.)?: A Little Help needed moving to and from a bed to chair (including a wheelchair)?: A Little Help needed walking in hospital room?: A Little Help needed climbing 3-5 steps with a railing? : A Little 6 Click Score: 18    End of Session Equipment Utilized During Treatment: Gait belt Activity Tolerance: Patient tolerated treatment well;Patient limited by lethargy Patient left: in bed;with bed alarm set;with call bell/phone within reach Nurse Communication: Mobility status PT Visit Diagnosis: Difficulty in walking, not elsewhere classified (R26.2);Other symptoms and signs involving the nervous system (R29.898)    Time: 2241-1464 PT Time Calculation (min) (ACUTE ONLY): 12 min   Charges:   PT Evaluation $PT Eval Low Complexity: 1 Procedure     PT G Codes:       3:28 PM, Nov 16, 2016 Etta Grandchild, PT, DPT Physical Therapist - [place] [phone] (Office)    Leveda Kendrix C 2016-11-16, 3:24 PM

## 2016-10-24 DIAGNOSIS — R4702 Dysphasia: Secondary | ICD-10-CM

## 2016-10-24 LAB — RENAL FUNCTION PANEL
ANION GAP: 11 (ref 5–15)
Albumin: 3.3 g/dL — ABNORMAL LOW (ref 3.5–5.0)
BUN: 40 mg/dL — ABNORMAL HIGH (ref 6–20)
CALCIUM: 8.8 mg/dL — AB (ref 8.9–10.3)
CHLORIDE: 100 mmol/L — AB (ref 101–111)
CO2: 24 mmol/L (ref 22–32)
Creatinine, Ser: 8.92 mg/dL — ABNORMAL HIGH (ref 0.44–1.00)
GFR calc non Af Amer: 4 mL/min — ABNORMAL LOW (ref >60)
GFR, EST AFRICAN AMERICAN: 5 mL/min — AB (ref >60)
GLUCOSE: 127 mg/dL — AB (ref 65–99)
POTASSIUM: 5.3 mmol/L — AB (ref 3.5–5.1)
Phosphorus: 3.9 mg/dL (ref 2.5–4.6)
Sodium: 135 mmol/L (ref 135–145)

## 2016-10-24 LAB — GLUCOSE, CAPILLARY
GLUCOSE-CAPILLARY: 116 mg/dL — AB (ref 65–99)
Glucose-Capillary: 129 mg/dL — ABNORMAL HIGH (ref 65–99)
Glucose-Capillary: 137 mg/dL — ABNORMAL HIGH (ref 65–99)
Glucose-Capillary: 123 mg/dL — ABNORMAL HIGH (ref 65–99)

## 2016-10-24 LAB — CBC
HEMATOCRIT: 31.2 % — AB (ref 36.0–46.0)
HEMOGLOBIN: 10.5 g/dL — AB (ref 12.0–15.0)
MCH: 31 pg (ref 26.0–34.0)
MCHC: 33.7 g/dL (ref 30.0–36.0)
MCV: 92 fL (ref 78.0–100.0)
Platelets: 250 10*3/uL (ref 150–400)
RBC: 3.39 MIL/uL — AB (ref 3.87–5.11)
RDW: 13.9 % (ref 11.5–15.5)
WBC: 5.2 10*3/uL (ref 4.0–10.5)

## 2016-10-24 LAB — HEPATITIS B SURFACE ANTIGEN: Hepatitis B Surface Ag: NEGATIVE

## 2016-10-24 MED ORDER — INSULIN GLARGINE 100 UNIT/ML ~~LOC~~ SOLN: 5.0000 [IU] | Freq: Every day | SUBCUTANEOUS | 0 refills | Status: DC

## 2016-10-24 NOTE — Progress Notes (Signed)
Discharged via private vehicle to [facility] group home

## 2016-10-24 NOTE — Clinical Social Work Note (Signed)
LCSW made an APS report with Ammie Ferrier at [facility]. Report was made in regards to attending's concerns related to facility's failure to return contact as well as the dialysis center stating that they have to remind the facility of patient's appointments.          Daunte Oestreich, Clydene Pugh, LCSW

## 2016-10-24 NOTE — Clinical Social Work Note (Addendum)
LCSW spoke with facility administrator, Lavada Mesi, [phone], to advise that patient was discharging and would need to be picked up by the facility. She indicated that she would have someone pick the patient up. LCSW discussed the APS report with Ms. Jeanell Sparrow.   LCSW sent clinicals via [employer].   Ivo Moga, Clydene Pugh, LCSW

## 2016-10-24 NOTE — Discharge Summary (Addendum)
Physician Discharge Summary  [name] CBS:[id number] DOB: [birth year] DOA: 10/21/2016  PCP: Patient, No Pcp Per  Admit date: 10/21/2016 Discharge date: 10/24/2016  Admitted From: Group Home Disposition:  Group Home  Recommendations for Outpatient Follow-up:  1. Follow up with PCP in 1-2 weeks 2. Please obtain BMP/CBC in one week 3. Please check CBGs before each meal and at bedtime and adjust the patient's insulin regimen based upon her CBGs. 4. Please schedule follow-up psychiatry appointment for the patient for further adjustment of her medications given her recent episode of altered mental status.    Discharge Condition: Stable CODE STATUS: FULL Diet recommendation: Renal   Brief/Interim Summary: 56 year old female with history of ESRD, diabetes mellitus, bipolar disorder, hypertension, schizophrenia presented with altered mental status. Apparently, the patient had her usual dialysis on 10/20/2016. She was noted to have increasing somnolent since that time for a period over 24 hours. She normally resides in a group home, and the patient was transferred to the emergency department because of increasing somnolence and difficulty to arouse. There are no reports of nausea, vomiting, diarrhea, chest pain. Attempts to contact family were unsuccessful. On 10/22/2016, the patient was arousable and able to converse although remains pleasantly confused.  During the first hospital day, the patient was quite somnolent although she was arousable. Workup for reversible causes of encephalopathy was essentially unremarkable.  Please see below for results. With each subsequent day during hospitalization, the patient's mental status continued to improve. For a period  24-48 hours prior to discharge, the patient's mental status was significant improved. The patient was awake and alert and conversant. The patient did have episodes of pleasant confusion.  There was much difficulty trying to contact her group  home facility where she resides to help clarify the patient's baseline mental status and other clinical factors. There was concern regarding the patient's social situation. Social work was consulted. Many times were made to contact the patient's brother without return call. It was noted that the patient did not have an official guardian or other acute. Ultimately, the patient's outpatient dialysis was contacted. I personally spoke with the charge nurse at [facility] [phone]) regarding patient's mental status and dialysis status. The charge nurse, Sharyn Lull, stated that the patient frequently had episodes of pleasant confusion and that her mental status would wax and wane depending on the day of the week. Sharyn Lull also clarified that the patient is normally able to ambulate. The case was discussed with social work at which time [facility] [employer] was contacted and the case was reported. After discussion with the patient's dialysis unit staff and evaluation of the patient, it was determined that the patient's mental status was likely at her baseline. Multiple attempts were initially made to obtain an MRI even with anxiolysis. Unfortunately the patient could not tolerate MRI. Nevertheless, the patient did not have any focal deficits. As her mental status improved and returned to baseline, it was felt that MRI was no longer necessary. The patient was dialyzed on 10/24/2016 prior to her discharge back to the group home.  In retrospect, the patient may have had hypoglycemia which resulted in her altered mental status. There was no documented CBGs by EMS. The patient's CBGs were  controlled during the entire hospitalization without any Lantus or glipizide. The patient will be discharged with a significantly lower dose of Lantus. I would recommend checking her CBGs before each meal and at bedtime and adjusting her insulin regimen based upon her CBGs.  Discharge  Diagnoses:  Acute toxic and  metabolic encephalopathy -Workup for reversible causes unrevelaing -Suspect medication related (prolixin, ativan, invega altogether) vs hypoglycemia -10/23/16--more alert, conversant, able to ambulate -ammonia 28 -Check Depakote level--25 -10/21/2016 CT brain negative for acute findings -Check serum B12--973 -Check TSH--2.331 -Holding Prolixin, Ativan, InVega, Cogentin, pepcid initially -Check ABG--7.399/50/81/29 on RA -Remains confused although unclear baseline mental status -attempts to contact family without success -7/6--discussed the patient with the charge nurse at the patient's outpatient dialysis center--[facility] [phone])--> the patient is essentially near her baseline. The patient has intermittent episodes of pleasant confusion and episodes of speaking incomprehensibly.  ESRD -Consult nephrology for her maintenance dialysis on Monday, Wednesday, Friday -Metabolic bone disease per nephrology -Last dialysis 10/20/2016 prior to admission -dialyzed on 10/24/16 during the hospitalization  Diabetes mellitus type 2 -Check hemoglobin A1c--6.5 -NovoLog sliding scale -Holding glipizide -Restart Lantus 5 units at bedtime after discharge--previously patient was receiving 30 units at bedtime  Bipolar disorder -Continue Wellbutrin -Holding Prolixin, Ativan, Invegasecondary to mental status change--restart after discharge  Essential hypertension? -Blood pressure acceptable presently -Not on any antihypertensive medications in the outpatient setting -Hydralazine when necessary SBP >180   Discharge Instructions  Discharge Instructions    Diet - low sodium heart healthy    Complete by:  As directed    Increase activity slowly    Complete by:  As directed      Allergies as of 10/24/2016   No Known Allergies     Medication List    STOP taking these medications   famotidine 40 MG tablet Commonly known as:  PEPCID   glipiZIDE 5 MG tablet Commonly  known as:  GLUCOTROL     TAKE these medications   acetaminophen 500 MG tablet Commonly known as:  TYLENOL Take 500 mg by mouth every 6 (six) hours as needed.   aspirin EC 81 MG tablet Take 81 mg by mouth daily.   benztropine 1 MG tablet Commonly known as:  COGENTIN Take 1 mg by mouth.   buPROPion 300 MG 24 hr tablet Commonly known as:  WELLBUTRIN XL Take 300 mg by mouth daily.   cinacalcet 30 MG tablet Commonly known as:  SENSIPAR Take 30 mg by mouth 3 (three) times a week. Take 30mg  on Tuesday, Thursday, and Saturday   divalproex 500 MG DR tablet Commonly known as:  DEPAKOTE Take 500 mg by mouth at bedtime.   docusate sodium 100 MG capsule Commonly known as:  COLACE Take 100 mg by mouth.   fluPHENAZine 5 MG tablet Commonly known as:  PROLIXIN Take 5 mg by mouth.   insulin glargine 100 UNIT/ML injection Commonly known as:  LANTUS Inject 0.05 mLs (5 Units total) into the skin at bedtime. What changed:  how much to take   insulin regular 100 units/mL injection Commonly known as:  NOVOLIN R,HUMULIN R Inject into the skin 3 (three) times daily before meals. >200-249 = 2 250-299=4 300-349=6 350-399=8 400-449=10 [phone] >500 ems   INVEGA SUSTENNA 234 MG/1.5ML Susp injection Generic drug:  paliperidone Inject 234 mg into the muscle every 30 (thirty) days.   latanoprost 0.005 % ophthalmic solution Commonly known as:  XALATAN Place 1 drop into both eyes at bedtime.   LORazepam 0.5 MG tablet Commonly known as:  ATIVAN Take 0.5 mg by mouth 2 (two) times daily. Take at 8:00am and 8:00pm   multivitamin with minerals Tabs tablet Take 1 tablet by mouth daily.   sevelamer carbonate 800 MG tablet Commonly known as:  RENVELA Take 800 mg by mouth 3 (three) times daily with meals.   simvastatin 40 MG tablet Commonly known as:  ZOCOR Take 40 mg by mouth daily.       Allergies no known allergies  Consultations:  nephrology   Procedures/Studies: Dg Chest 1  View  Result Date: 10/21/2016 CLINICAL DATA:  Altered mental status after dialysis EXAM: CHEST 1 VIEW COMPARISON:  Chest radiograph 09/17/2012 FINDINGS: Right IJ approach hemodialysis catheter tip overlies the intrahepatic inferior vena cava. Position is approximately unchanged from the study of 09/17/2012 when allowing for technical differences. There is bibasilar atelectasis without other focal consolidation. No pulmonary edema. Heart size is normal. IMPRESSION: No active disease. Electronically Signed   By: Ulyses Jarred M.D.   On: 10/21/2016 23:03   Ct Head Wo Contrast  Result Date: 10/21/2016 CLINICAL DATA:  Acute onset of altered level of consciousness. Initial encounter. EXAM: CT HEAD WITHOUT CONTRAST TECHNIQUE: Contiguous axial images were obtained from the base of the skull through the vertex without intravenous contrast. COMPARISON:  CT of the head performed 09/17/2012 FINDINGS: Brain: No evidence of acute infarction, hemorrhage, hydrocephalus, extra-axial collection or mass lesion/mass effect. Prominence of the ventricles and sulci reflects moderate cortical volume loss. Mild periventricular white matter change likely reflects small vessel ischemic microangiopathy. The brainstem and fourth ventricle are within normal limits. The basal ganglia are unremarkable in appearance. The cerebral hemispheres demonstrate grossly normal gray-white differentiation. No mass effect or midline shift is seen. Vascular: No hyperdense vessel or unexpected calcification. Skull: There is no evidence of fracture; visualized osseous structures are unremarkable in appearance. Sinuses/Orbits: The visualized portions of the orbits are within normal limits. The paranasal sinuses and mastoid air cells are well-aerated. Other: No significant soft tissue abnormalities are seen. IMPRESSION: 1. No acute intracranial pathology seen on CT. 2. Moderate cortical volume loss and scattered small vessel ischemic microangiopathy.  Electronically Signed   By: Garald Balding M.D.   On: 10/21/2016 22:53        Discharge Exam: Vitals:   10/24/16 0433 10/24/16 1357  BP: (!) 153/52 (!) 148/77  Pulse: 60 61  Resp: 18 19  Temp: 98.4 F (36.9 C) (!) 97.5 F (36.4 C)   Vitals:   10/23/16 0819 10/23/16 2047 10/24/16 0433 10/24/16 1357  BP: (!) 185/77 (!) 160/84 (!) 153/52 (!) 148/77  Pulse: 69 66 60 61  Resp: 17 18 18 19   Temp: 97.8 F (36.6 C) 98.2 F (36.8 C) 98.4 F (36.9 C) (!) 97.5 F (36.4 C)  TempSrc: Oral Axillary Oral Axillary  SpO2: 100% 100% 100% 100%  Weight:   71.2 kg (156 lb 15.5 oz)   Height:        General: Pt is alert, awake, not in acute distress Cardiovascular: RRR, S1/S2 +, no rubs, no gallops Respiratory: CTA bilaterally, no wheezing, no rhonchi Abdominal: Soft, NT, ND, bowel sounds + Extremities: no edema, no cyanosis   The results of significant diagnostics from this hospitalization (including imaging, microbiology, ancillary and laboratory) are listed below for reference.    Significant Diagnostic Studies: Dg Chest 1 View  Result Date: 10/21/2016 CLINICAL DATA:  Altered mental status after dialysis EXAM: CHEST 1 VIEW COMPARISON:  Chest radiograph 09/17/2012 FINDINGS: Right IJ approach hemodialysis catheter tip overlies the intrahepatic inferior vena cava. Position is approximately unchanged from the study of 09/17/2012 when allowing for technical differences. There is bibasilar atelectasis without other focal consolidation. No pulmonary edema. Heart size is normal. IMPRESSION: No active disease. Electronically  Signed   By: Ulyses Jarred M.D.   On: 10/21/2016 23:03   Ct Head Wo Contrast  Result Date: 10/21/2016 CLINICAL DATA:  Acute onset of altered level of consciousness. Initial encounter. EXAM: CT HEAD WITHOUT CONTRAST TECHNIQUE: Contiguous axial images were obtained from the base of the skull through the vertex without intravenous contrast. COMPARISON:  CT of the head performed  09/17/2012 FINDINGS: Brain: No evidence of acute infarction, hemorrhage, hydrocephalus, extra-axial collection or mass lesion/mass effect. Prominence of the ventricles and sulci reflects moderate cortical volume loss. Mild periventricular white matter change likely reflects small vessel ischemic microangiopathy. The brainstem and fourth ventricle are within normal limits. The basal ganglia are unremarkable in appearance. The cerebral hemispheres demonstrate grossly normal gray-white differentiation. No mass effect or midline shift is seen. Vascular: No hyperdense vessel or unexpected calcification. Skull: There is no evidence of fracture; visualized osseous structures are unremarkable in appearance. Sinuses/Orbits: The visualized portions of the orbits are within normal limits. The paranasal sinuses and mastoid air cells are well-aerated. Other: No significant soft tissue abnormalities are seen. IMPRESSION: 1. No acute intracranial pathology seen on CT. 2. Moderate cortical volume loss and scattered small vessel ischemic microangiopathy. Electronically Signed   By: Garald Balding M.D.   On: 10/21/2016 22:53     Microbiology: Recent Results (from the past 240 hour(s))  MRSA PCR Screening     Status: None   Collection Time: 10/23/16  3:35 AM  Result Value Ref Range Status   MRSA by PCR NEGATIVE NEGATIVE Final    Comment:        The GeneXpert MRSA Assay (FDA approved for NASAL specimens only), is one component of a comprehensive MRSA colonization surveillance program. It is not intended to diagnose MRSA infection nor to guide or monitor treatment for MRSA infections.      Labs: Basic Metabolic Panel:  Recent Labs Lab 10/21/16 2156 10/21/16 2210 10/22/16 0743 10/23/16 0757 10/23/16 1951 10/24/16 0646  NA 134*  --  138 136 133* 135  K 4.0  --  4.2 5.0 4.6 5.3*  CL 93*  --  97* 98* 97* 100*  CO2 31  --  31 28 24 24   GLUCOSE 146*  --  93 151* 222* 127*  BUN 27*  --  28* 35* 38* 40*    CREATININE 6.32*  --  6.73* 7.52* 8.04* 8.92*  CALCIUM 9.3  --  9.5 9.4 8.8* 8.8*  MG  --  2.6*  --   --   --   --   PHOS  --   --   --  3.0 3.1 3.9   Liver Function Tests:  Recent Labs Lab 10/21/16 2156 10/22/16 0743 10/23/16 0757 10/23/16 1951 10/24/16 0646  AST 15 18  --   --   --   ALT 14 14  --   --   --   ALKPHOS 87 90  --   --   --   BILITOT 0.4 0.6  --   --   --   PROT 7.5 7.6  --   --   --   ALBUMIN 3.6 3.6 3.5 3.6 3.3*   No results for input(s): LIPASE, AMYLASE in the last 168 hours.  Recent Labs Lab 10/21/16 2210  AMMONIA 28   CBC:  Recent Labs Lab 10/21/16 2156 10/22/16 0743 10/23/16 1951 10/24/16 0646  WBC 7.0 5.4 6.9 5.2  HGB 10.0* 11.1* 11.6* 10.5*  HCT 29.8* 33.3* 34.5* 31.2*  MCV 92.5 92.8  92.2 92.0  PLT 230 221 261 250   Cardiac Enzymes:  Recent Labs Lab 10/21/16 2210  TROPONINI <0.03   BNP: Invalid input(s): POCBNP CBG:  Recent Labs Lab 10/23/16 1149 10/23/16 1649 10/23/16 2047 10/24/16 0741 10/24/16 1138  GLUCAP 144* 103* 220* 129* 137*    Time coordinating discharge:  Greater than 30 minutes  Signed:  Terryon Pineiro, DO [facility] Pager: [phone] 10/24/2016, 3:10 PM

## 2016-10-24 NOTE — NC FL2 (Signed)
[place] LEVEL OF CARE SCREENING TOOL     IDENTIFICATION  Patient Name: [name] Birthdate: 07/12/1960 Sex: female Admission Date (Current Location): 10/21/2016  [facility] and [place] Number:  [employer] and Address:  [facility] [address] [place]      Provider Number: [id number]  Attending Physician Name and Address:  Orson Eva, MD  Relative Name and Phone Number:       Current Level of Care: Hospital Recommended Level of Care: [place] Prior Approval Number:    Date Approved/Denied:   PASRR Number:    Discharge Plan: Other (Comment) ([place])    Current Diagnoses: Patient Active Problem List   Diagnosis Date Noted  . Dysphasia   . Altered mental status 10/22/2016  . ESRD (end stage renal disease) ([place]) 10/22/2016  . Bipolar 1 disorder ([place]) 10/22/2016  . Diabetes mellitus ([place]) 10/22/2016  . Essential hypertension 10/22/2016  . Acute encephalopathy 10/22/2016    Orientation RESPIRATION BLADDER Height & Weight     Self  Normal Continent Weight: 157 lb 10.1 oz (71.5 kg) Height:  4\' 9"  (144.8 cm)  BEHAVIORAL SYMPTOMS/MOOD NEUROLOGICAL BOWEL NUTRITION STATUS      Continent Diet (Diet renal with fluid restriction 1200 mL)  AMBULATORY STATUS COMMUNICATION OF NEEDS Skin   Limited Assist Verbally Normal                       Personal Care Assistance Level of Assistance  Bathing, Feeding, Dressing Bathing Assistance: Limited assistance Feeding assistance: Independent Dressing Assistance: Limited assistance     Functional Limitations Info  Sight, Hearing, Speech Sight Info: Adequate Hearing Info: Adequate Speech Info: Adequate    SPECIAL CARE FACTORS FREQUENCY  PT (By licensed PT)     PT Frequency: 3x/week              Contractures Contractures Info: Not present    Additional Factors Info  Code Status, Psychotropic Code Status Info: Full Code             Current Medications (10/24/2016):  This is the current hospital active medication list Current Facility-Administered Medications  Medication Dose Route Frequency Provider Last Rate Last Dose  . 0.9 %  sodium chloride infusion  250 mL Intravenous PRN [place], Marge Duncans, MD      . 0.9 %  sodium chloride infusion  100 mL Intravenous PRN Befekadu, Belayenh, MD      . 0.9 %  sodium chloride infusion  100 mL Intravenous PRN Fran Lowes, MD      . acetaminophen (TYLENOL) tablet 500 mg  500 mg Oral Q6H PRN Darrick Meigs, Marge Duncans, MD      . alteplase (CATHFLO ACTIVASE) injection 2 mg  2 mg Intracatheter Once PRN Fran Lowes, MD      . aspirin EC tablet 81 mg  81 mg Oral Daily Oswald Hillock, MD   81 mg at 10/24/16 1100  . buPROPion (WELLBUTRIN XL) 24 hr tablet 300 mg  300 mg Oral Daily Oswald Hillock, MD   300 mg at 10/24/16 1100  . cinacalcet (SENSIPAR) tablet 30 mg  30 mg Oral Once per day on Tue Thu Sat Oswald Hillock, MD   30 mg at 10/23/16 1103  . divalproex (DEPAKOTE SPRINKLE) capsule 250 mg  250 mg Oral BID Tat, Shanon Brow, MD   250 mg at 10/24/16 1100  . heparin injection 1,000 Units  1,000 Units Dialysis PRN Fran Lowes, MD      . heparin injection 5,000 Units  5,000 Units Subcutaneous Q8H Oswald Hillock, MD   5,000 Units at 10/24/16 1356  . hydrALAZINE (APRESOLINE) injection 5 mg  5 mg Intravenous Q6H PRN Orson Eva, MD   5 mg at 10/22/16 2120  . insulin aspart (novoLOG) injection 0-9 Units  0-9 Units Subcutaneous TID WC Oswald Hillock, MD   1 Units at 10/24/16 1234  . lidocaine (PF) (XYLOCAINE) 1 % injection 5 mL  5 mL Intradermal PRN Fran Lowes, MD      . lidocaine-prilocaine (EMLA) cream 1 application  1 application Topical PRN Fran Lowes, MD      . ondansetron (ZOFRAN) tablet 4 mg  4 mg Oral Q6H PRN Oswald Hillock, MD       Or  . ondansetron (ZOFRAN) injection 4 mg  4 mg Intravenous Q6H PRN Darrick Meigs, Marge Duncans, MD      . pentafluoroprop-tetrafluoroeth (GEBAUERS) aerosol 1  application  1 application Topical PRN Fran Lowes, MD      . sodium chloride flush (NS) 0.9 % injection 3 mL  3 mL Intravenous Q12H Darrick Meigs, Marge Duncans, MD   3 mL at 10/24/16 1100  . sodium chloride flush (NS) 0.9 % injection 3 mL  3 mL Intravenous PRN Oswald Hillock, MD         Discharge Medications: STOP taking these medications   famotidine 40 MG tablet Commonly known as:  PEPCID   glipiZIDE 5 MG tablet Commonly known as:  GLUCOTROL     TAKE these medications   acetaminophen 500 MG tablet Commonly known as:  TYLENOL Take 500 mg by mouth every 6 (six) hours as needed.   aspirin EC 81 MG tablet Take 81 mg by mouth daily.   benztropine 1 MG tablet Commonly known as:  COGENTIN Take 1 mg by mouth.   buPROPion 300 MG 24 hr tablet Commonly known as:  WELLBUTRIN XL Take 300 mg by mouth daily.   cinacalcet 30 MG tablet Commonly known as:  SENSIPAR Take 30 mg by mouth 3 (three) times a week. Take 30mg  on Tuesday, Thursday, and Saturday   divalproex 500 MG DR tablet Commonly known as:  DEPAKOTE Take 500 mg by mouth at bedtime.   docusate sodium 100 MG capsule Commonly known as:  COLACE Take 100 mg by mouth.   fluPHENAZine 5 MG tablet Commonly known as:  PROLIXIN Take 5 mg by mouth.   insulin glargine 100 UNIT/ML injection Commonly known as:  LANTUS Inject 0.05 mLs (5 Units total) into the skin at bedtime. What changed:  how much to take   insulin regular 100 units/mL injection Commonly known as:  NOVOLIN R,HUMULIN R Inject into the skin 3 (three) times daily before meals. >200-249 = 2 250-299=4 300-349=6 350-399=8 400-449=10 [phone] >500 ems   INVEGA SUSTENNA 234 MG/1.5ML Susp injection Generic drug:  paliperidone Inject 234 mg into the muscle every 30 (thirty) days.   latanoprost 0.005 % ophthalmic solution Commonly known as:  XALATAN Place 1 drop into both eyes at bedtime.   LORazepam 0.5 MG tablet Commonly known as:  ATIVAN Take 0.5 mg  by mouth 2 (two) times daily. Take at 8:00am and 8:00pm   multivitamin with minerals Tabs tablet Take 1 tablet by mouth daily.   sevelamer carbonate 800 MG tablet Commonly known as:  RENVELA Take 800 mg by mouth 3 (three) times daily with meals.   simvastatin 40  MG tablet Commonly known as:  ZOCOR Take 40 mg by mouth daily.      Relevant Imaging Results:  Relevant Lab Results:   Additional Information    Calina Patrie, Clydene Pugh, LCSW

## 2016-10-24 NOTE — Progress Notes (Signed)
Subjective: Interval History: has no complaint of nausea or vomiting. Patient also denies and difficulty breathing..  Objective: Vital signs in last 24 hours: Temp:  [97.8 F (36.6 C)-98.4 F (36.9 C)] 98.4 F (36.9 C) (07/06 0433) Pulse Rate:  [60-69] 60 (07/06 0433) Resp:  [17-18] 18 (07/06 0433) BP: (153-185)/(52-84) 153/52 (07/06 0433) SpO2:  [100 %] 100 % (07/06 0433) Weight:  [71.2 kg (156 lb 15.5 oz)] 71.2 kg (156 lb 15.5 oz) (07/06 0433) Weight change:   Intake/Output from previous day: 07/05 0701 - 07/06 0700 In: 120 [P.O.:120] Out: -  Intake/Output this shift: No intake/output data recorded.  Generally patient was sleeping when I went to her home. I'll wait. Patient is cooperative to some extent. She states she was signed dialysis consent. There is no other family member with patient. Still patient doesn't know how long she has been on dialysis even though she has been on dialysis for 4 years. Chest is clear to auscultation Heart exam revealed regular rate and rhythm no murmur Extremities no edema  Lab Results:  Recent Labs  10/23/16 1951 10/24/16 0646  WBC 6.9 5.2  HGB 11.6* 10.5*  HCT 34.5* 31.2*  PLT 261 250   BMET:  Recent Labs  10/23/16 1951 10/24/16 0646  NA 133* 135  K 4.6 5.3*  CL 97* 100*  CO2 24 24  GLUCOSE 222* 127*  BUN 38* 40*  CREATININE 8.04* 8.92*  CALCIUM 8.8* 8.8*   No results for input(s): PTH in the last 72 hours. Iron Studies: No results for input(s): IRON, TIBC, TRANSFERRIN, FERRITIN in the last 72 hours.  Studies/Results: No results found.  I have reviewed the patient's current medications.  Assessment/Plan: Problem #1 altered mental status: Seems to be better. Patient is still doesn't seem to be very cooperative in answering questions. But she denies any difficulty breathing. And also she is agreeable to sign for dialysis. Problem #2 end-stage renal disease: She is status post hemodialysis on Monday. Her potassium 5.3  today. She denies any nausea or vomiting. Problem #3 anemia: Her hemoglobin is within our target goal Problem #4 1 disorder: Her calcium and phosphorus is range Problem #5 history of hypertension Problem #6 history of bipolar disorder/schizophrenia Plan:1] If she is agreeable we'll dialyze patient today for 31/2hours 2]We'll continue his other medications as before.    LOS: 2 days   Mary-Anne Polizzi S 10/24/2016,8:09 AM

## 2016-10-24 NOTE — Clinical Social Work Note (Signed)
LCSW met with APS social worker, Sarita Bottom, [phone]. She stated that report had been accepted. She confirmed that patient's brother was her legal guardian, however the facility did not have working contact information. She stated that patient could be released to the facility and she would follow up on Monday with the facility.    [name], Clydene Pugh, LCSW

## 2016-10-24 NOTE — Progress Notes (Signed)
Discharge instructions reviewed with [facility] taker for [facility]. Ms Farris Has verbalized understanding of instructions. A written copy of instructions will be sent with patient.

## 2016-10-24 NOTE — Care Management Note (Signed)
Case Management Note  Patient Details  Name: [name] MRN: [id number] Date of Birth: 09-06-1960  Subjective/Objective:                  Pt admitted with AMS. Per CSW note pt is very ind at DC. She has no HH or DME needs pta. She lives at [facility]. CSW aware. Pt goes to HD MWF at [facility] in [place]. PT has recommended HH PT but pt has medicaid and PT is not covered.  Action/Plan: Anticipate return to ALF with self care. No CM needs. CSW to make arrangements for return to facility.   Expected Discharge Date:       10/24/2016           Expected Discharge Plan:  Assisted Living / Rest Home  In-House Referral:  NA  Discharge planning Services  CM Consult  Post Acute Care Choice:  NA Choice offered to:  NA  Status of Service:  Completed, signed off   Sherald Barge, RN 10/24/2016, 1:23 PM

## 2016-11-12 ENCOUNTER — Encounter (HOSPITAL_COMMUNITY): Payer: Self-pay | Admitting: *Deleted

## 2016-11-12 ENCOUNTER — Emergency Department (HOSPITAL_COMMUNITY)
Admission: EM | Admit: 2016-11-12 | Discharge: 2016-11-12 | Disposition: A | Discharge status: Home / Self Care | Payer: Medicaid Other | Attending: Emergency Medicine | Admitting: Emergency Medicine

## 2016-11-12 DIAGNOSIS — Z7982 Long term (current) use of aspirin: Secondary | ICD-10-CM | POA: Diagnosis not present

## 2016-11-12 DIAGNOSIS — R400 Somnolence: Secondary | ICD-10-CM | POA: Insufficient documentation

## 2016-11-12 DIAGNOSIS — R4182 Altered mental status, unspecified: Secondary | ICD-10-CM | POA: Diagnosis present

## 2016-11-12 DIAGNOSIS — F319 Bipolar disorder, unspecified: Secondary | ICD-10-CM | POA: Diagnosis not present

## 2016-11-12 DIAGNOSIS — I120 Hypertensive chronic kidney disease with stage 5 chronic kidney disease or end stage renal disease: Secondary | ICD-10-CM | POA: Diagnosis not present

## 2016-11-12 DIAGNOSIS — N186 End stage renal disease: Secondary | ICD-10-CM | POA: Insufficient documentation

## 2016-11-12 DIAGNOSIS — F17290 Nicotine dependence, other tobacco product, uncomplicated: Secondary | ICD-10-CM | POA: Insufficient documentation

## 2016-11-12 DIAGNOSIS — Z794 Long term (current) use of insulin: Secondary | ICD-10-CM | POA: Insufficient documentation

## 2016-11-12 DIAGNOSIS — Z79899 Other long term (current) drug therapy: Secondary | ICD-10-CM | POA: Insufficient documentation

## 2016-11-12 LAB — CBG MONITORING, ED: Glucose-Capillary: 182 mg/dL — ABNORMAL HIGH (ref 65–99)

## 2016-11-12 NOTE — ED Notes (Signed)
Attempted to draw labs, patient became very agitated and combative. Pt pulled the needle out of her arm.  Pt was very aggressive toward staff and attempted to leave, security was called.  Pts group home staff was contacted to speak with patient.  Pt did become more cooperative and agreed to sit down and stay.

## 2016-11-12 NOTE — ED Notes (Signed)
Per Fraser Din RN at Dialysis, left arm is used for IVs and blood draws.

## 2016-11-12 NOTE — Discharge Instructions (Signed)
Follow up with primary care provider 

## 2016-11-12 NOTE — ED Provider Notes (Signed)
[facility] DEPT Provider Note   CSN: [id number] Arrival date & time: 11/12/16  1654     History   Chief Complaint Chief Complaint  Patient presents with  . Altered Mental Status    HPI [name] is a 56 y.o. female.  HPI Patient presents from group home with altered mental status. She does not know why she is in the emergency department. She denies any current pain. States she received hemodialysis this morning. Was seen earlier this month for similar presentation with negative workup. Past Medical History:  Diagnosis Date  . Bipolar 1 disorder ([place])   . Diabetes mellitus without complication ([place])   . Hypertension   . Schizophrenia [facility])     Patient Active Problem List   Diagnosis Date Noted  . Dysphasia   . Altered mental status 10/22/2016  . ESRD (end stage renal disease) ([place]) 10/22/2016  . Bipolar 1 disorder ([place]) 10/22/2016  . Diabetes mellitus ([place]) 10/22/2016  . Essential hypertension 10/22/2016  . Acute encephalopathy 10/22/2016    No past surgical history on file.  OB History    No data available       Home Medications    Prior to Admission medications   Medication Sig Start Date End Date Taking? Authorizing Provider  acetaminophen (TYLENOL) 500 MG tablet Take 500 mg by mouth every 6 (six) hours as needed.    [provider]  aspirin EC 81 MG tablet Take 81 mg by mouth daily.    [provider]  benztropine (COGENTIN) 1 MG tablet Take 1 mg by mouth.    [provider]  buPROPion (WELLBUTRIN XL) 300 MG 24 hr tablet Take 300 mg by mouth daily.    [provider]  cinacalcet (SENSIPAR) 30 MG tablet Take 30 mg by mouth 3 (three) times a week. Take 30mg  on Tuesday, Thursday, and Saturday    [provider]  divalproex (DEPAKOTE) 500 MG DR tablet Take 500 mg by mouth at bedtime.     [provider]  docusate sodium (COLACE) 100 MG capsule Take 100 mg by mouth.    [provider]    fluPHENAZine (PROLIXIN) 5 MG tablet Take 5 mg by mouth.    [provider]  insulin glargine (LANTUS) 100 UNIT/ML injection Inject 0.05 mLs (5 Units total) into the skin at bedtime. 10/24/16   Orson Eva, MD  insulin regular (NOVOLIN R,HUMULIN R) 100 units/mL injection Inject into the skin 3 (three) times daily before meals. >200-249 = 2 250-299=4 300-349=6 350-399=8 400-449=10 959 850 2147=12 >500 ems    [provider]  latanoprost (XALATAN) 0.005 % ophthalmic solution Place 1 drop into both eyes at bedtime.     [provider]  LORazepam (ATIVAN) 0.5 MG tablet Take 0.5 mg by mouth 2 (two) times daily. Take at 8:00am and 8:00pm    [provider]  Multiple Vitamin (MULTIVITAMIN WITH MINERALS) TABS tablet Take 1 tablet by mouth daily.    [provider]  paliperidone (INVEGA SUSTENNA) 234 MG/1.5ML SUSP injection Inject 234 mg into the muscle every 30 (thirty) days.    [provider]  sevelamer carbonate (RENVELA) 800 MG tablet Take 800 mg by mouth 3 (three) times daily with meals.     [provider]  simvastatin (ZOCOR) 40 MG tablet Take 40 mg by mouth daily.    [provider]    Family History No family history on file.  Social History Social History  Substance Use Topics  .  Smoking status: Unknown If Ever Smoked  . Smokeless tobacco: Current User  . Alcohol use No     Allergies   Patient has no known allergies.   Review of Systems Review of Systems  Constitutional: Negative for fever.  Respiratory: Negative for shortness of breath.   Cardiovascular: Negative for chest pain.  Gastrointestinal: Negative for abdominal pain, diarrhea and vomiting.  Musculoskeletal: Negative for back pain and neck pain.  Skin: Negative for rash.  Neurological: Negative for weakness, numbness and headaches.  All other systems reviewed and are negative.    Physical Exam Updated Vital Signs SpO2 100%   Physical Exam   Constitutional: She is oriented to person, place, and time. She appears well-developed and well-nourished. No distress.  HENT:  Head: Normocephalic and atraumatic.  Mouth/Throat: Oropharynx is clear and moist. No oropharyngeal exudate.  No evidence of head trauma. No intraoral trauma.  Eyes: Pupils are equal, round, and reactive to light. EOM are normal.  Neck: Normal range of motion. Neck supple.  No posterior midline cervical tenderness to palpation. No meningismus.  Cardiovascular: Normal rate and regular rhythm.  Exam reveals no gallop and no friction rub.   No murmur heard. Pulmonary/Chest: Effort normal and breath sounds normal. No respiratory distress. She has no wheezes. She has no rales. She exhibits no tenderness.  Abdominal: Soft. Bowel sounds are normal. There is no tenderness. There is no rebound and no guarding.  Musculoskeletal: Normal range of motion. She exhibits no edema or tenderness.  Left upper extremity fistula with palpable thrill.  Neurological: She is alert and oriented to person, place, and time.  Mildly drowsy but answers questions and follows commands. 5/5 motor in all extremities. Sensation intact.  Skin: Skin is warm and dry. Capillary refill takes less than 2 seconds. No rash noted. No erythema.  Psychiatric: She has a normal mood and affect. Her behavior is normal.  Nursing note and vitals reviewed.    ED Treatments / Results  Labs (all labs ordered are listed, but only abnormal results are displayed) Labs Reviewed  CBG MONITORING, ED - Abnormal; Notable for the following:       Result Value   Glucose-Capillary 182 (*)    All other components within normal limits    EKG  EKG Interpretation  Date/Time:  Wednesday November 12 2016 16:59:20 EDT Ventricular Rate:  71 PR Interval:    QRS Duration: 109 QT Interval:  328 QTC Calculation: 357 R Axis:   43 Text Interpretation:  Sinus rhythm Borderline short PR interval Borderline repolarization  abnormality Baseline wander in lead(s) V4 No significant change since last tracing Confirmed by Dorie Rank [id number]) on 11/13/2016 4:24:57 PM       Radiology No results found.  Procedures Procedures (including critical care time)  Medications Ordered in ED Medications - No data to display   Initial Impression / Assessment and Plan / ED Course  I have reviewed the triage vital signs and the nursing notes.  Pertinent labs & imaging results that were available during my care of the patient were reviewed by me and considered in my medical decision making (see chart for details).    Family patient's bedside. She is acting at her baseline mental status. Review of records from her previous admission. Appears to occasionally has intermittent drowsiness. Family states they will take patient to group home. Understands need to return for any worsening of her symptoms or concerns.  Final Clinical Impressions(s) / ED Diagnoses   Final diagnoses:  Intermittent  drowsiness    New Prescriptions Discharge Medication List as of 11/12/2016  8:27 PM       Julianne Rice, MD 11/13/16 1818

## 2016-11-12 NOTE — ED Triage Notes (Signed)
Pt had procedure yesterday on dialysis shunt, per dialysis cntr and group home pt has "not been the same"  Pt sleeping more, not acting her usual self.  Pt had to use wheelchair for transport to dialysis and she is normally ambulatory.  Pt was brought in for evaluation.

## 2016-11-12 NOTE — ED Notes (Signed)
Pt brother called and was made aware of situation and is coming in to sit with patient.

## 2016-11-12 NOTE — ED Notes (Signed)
Gave report to [place] at group home and informed that patient is being released in brother's custody, and he would return her to home.

## 2017-01-23 ENCOUNTER — Encounter (HOSPITAL_COMMUNITY): Payer: Self-pay

## 2017-01-23 ENCOUNTER — Emergency Department (HOSPITAL_COMMUNITY)
Admission: EM | Admit: 2017-01-23 | Discharge: 2017-01-23 | Disposition: A | Discharge status: Home / Self Care | Payer: Medicaid Other | Attending: Emergency Medicine | Admitting: Emergency Medicine

## 2017-01-23 DIAGNOSIS — I120 Hypertensive chronic kidney disease with stage 5 chronic kidney disease or end stage renal disease: Secondary | ICD-10-CM | POA: Insufficient documentation

## 2017-01-23 DIAGNOSIS — N186 End stage renal disease: Secondary | ICD-10-CM | POA: Diagnosis not present

## 2017-01-23 DIAGNOSIS — Z79899 Other long term (current) drug therapy: Secondary | ICD-10-CM | POA: Insufficient documentation

## 2017-01-23 DIAGNOSIS — Z794 Long term (current) use of insulin: Secondary | ICD-10-CM | POA: Insufficient documentation

## 2017-01-23 DIAGNOSIS — Z7982 Long term (current) use of aspirin: Secondary | ICD-10-CM | POA: Insufficient documentation

## 2017-01-23 DIAGNOSIS — Z992 Dependence on renal dialysis: Secondary | ICD-10-CM | POA: Insufficient documentation

## 2017-01-23 DIAGNOSIS — E1122 Type 2 diabetes mellitus with diabetic chronic kidney disease: Secondary | ICD-10-CM | POA: Insufficient documentation

## 2017-01-23 DIAGNOSIS — T82898A Other specified complication of vascular prosthetic devices, implants and grafts, initial encounter: Secondary | ICD-10-CM

## 2017-01-23 LAB — CBG MONITORING, ED: GLUCOSE-CAPILLARY: 298 mg/dL — AB (ref 65–99)

## 2017-01-23 NOTE — ED Triage Notes (Signed)
Pt in by [facility] ems for hyperglycemia tonight and dialysis access was bleeding.  Per [facility], staff reported pt's access was bleeding this evening after returning from dialysis today.

## 2017-01-23 NOTE — Discharge Instructions (Signed)
Return to the ER for severe or worsening bleeding

## 2017-01-23 NOTE — ED Notes (Signed)
Awaiting ems transport

## 2017-01-23 NOTE — ED Provider Notes (Signed)
[place] DEPT Provider Note   CSN: [id number] Arrival date & time: 01/23/17  1902     History   Chief Complaint Chief Complaint  Patient presents with  . Hyperglycemia    HPI [name] is a 56 y.o. female.  HPI  56 year old female with a history of bipolar disorder, diabetes, schizophrenia, currently lives in a group home and is on dialysis Monday Wednesday and Friday for end-stage renal disease. She had her dialysis today, she accidentally took off the dressing and scratched over the dialysis access and caused a small amount of bleeding. Paramedics were called because of the bleeding and found the patient to be hemodynamically stable with no active bleeding. She had slight hypertension, no other complaints. There was no fever, no vomiting, or difficulty breathing. A dressing was placed prehospital and there is been no bleeding through the dressing. It was noted that the patient was slightly hyperglycemic by paramedics with a blood sugar just over 300. Due to the patient's underlying schizophrenia and mental retardation no other review of systems were able to be obtained. Information was collected primarily from the paramedics.  Past Medical History:  Diagnosis Date  . Bipolar 1 disorder ([place])   . Diabetes mellitus without complication ([place])   . Hypertension   . Schizophrenia [facility])     Patient Active Problem List   Diagnosis Date Noted  . Dysphasia   . Altered mental status 10/22/2016  . ESRD (end stage renal disease) ([place]) 10/22/2016  . Bipolar 1 disorder ([place]) 10/22/2016  . Diabetes mellitus ([place]) 10/22/2016  . Essential hypertension 10/22/2016  . Acute encephalopathy 10/22/2016    History reviewed. No pertinent surgical history.  OB History    No data available       Home Medications    Prior to Admission medications   Medication Sig Start Date End Date Taking? Authorizing Provider  acetaminophen (TYLENOL) 500 MG tablet Take 500 mg by mouth every 6  (six) hours as needed.    [provider]  aspirin EC 81 MG tablet Take 81 mg by mouth daily.    [provider]  benztropine (COGENTIN) 1 MG tablet Take 1 mg by mouth.    [provider]  buPROPion (WELLBUTRIN XL) 300 MG 24 hr tablet Take 300 mg by mouth daily.    [provider]  cinacalcet (SENSIPAR) 30 MG tablet Take 30 mg by mouth 3 (three) times a week. Take 30mg  on Tuesday, Thursday, and Saturday    [provider]  divalproex (DEPAKOTE) 500 MG DR tablet Take 500 mg by mouth at bedtime.     [provider]  docusate sodium (COLACE) 100 MG capsule Take 100 mg by mouth.    [provider]  fluPHENAZine (PROLIXIN) 5 MG tablet Take 5 mg by mouth.    [provider]  insulin glargine (LANTUS) 100 UNIT/ML injection Inject 0.05 mLs (5 Units total) into the skin at bedtime. 10/24/16   Orson Eva, MD  insulin regular (NOVOLIN R,HUMULIN R) 100 units/mL injection Inject into the skin 3 (three) times daily before meals. >200-249 = 2 250-299=4 300-349=6 350-399=8 400-449=10 [phone]=12 >500 ems    [provider]  latanoprost (XALATAN) 0.005 % ophthalmic solution Place 1 drop into both eyes at bedtime.     [provider]  LORazepam (ATIVAN) 0.5 MG tablet Take 0.5 mg by mouth 2 (two) times daily. Take at 8:00am and 8:00pm    [provider]  Multiple Vitamin (MULTIVITAMIN WITH MINERALS)  TABS tablet Take 1 tablet by mouth daily.    [provider]  paliperidone (INVEGA SUSTENNA) 234 MG/1.5ML SUSP injection Inject 234 mg into the muscle every 30 (thirty) days.    [provider]  sevelamer carbonate (RENVELA) 800 MG tablet Take 800 mg by mouth 3 (three) times daily with meals.     [provider]  simvastatin (ZOCOR) 40 MG tablet Take 40 mg by mouth daily.    [provider]    Family History No family history on file.  Social History Social History  Substance Use Topics   . Smoking status: Unknown If Ever Smoked  . Smokeless tobacco: Current User  . Alcohol use No     Allergies   Patient has no known allergies.   Review of Systems Review of Systems  Unable to perform ROS: Psychiatric disorder     Physical Exam Updated Vital Signs BP (!) 175/77 (BP Location: Left Arm)   Pulse 77   Temp 98.7 F (37.1 C) (Oral)   Resp 18   SpO2 98%   Physical Exam  Constitutional: She appears well-developed and well-nourished. No distress.  HENT:  Head: Normocephalic and atraumatic.  Mouth/Throat: Oropharynx is clear and moist. No oropharyngeal exudate.  Eyes: Pupils are equal, round, and reactive to light. Conjunctivae and EOM are normal. Right eye exhibits no discharge. Left eye exhibits no discharge. No scleral icterus.  Neck: Normal range of motion. Neck supple. No JVD present. No thyromegaly present.  Cardiovascular: Normal rate, regular rhythm, normal heart sounds and intact distal pulses.  Exam reveals no gallop and no friction rub.   No murmur heard. Dialysis access in the right upper extremity with normal thrill, no signs of bleeding, slight area of crust at the area that was bleeding. Dressing is completely dry. No tenderness or surrounding redness around the fistula  Pulmonary/Chest: Effort normal and breath sounds normal. No respiratory distress. She has no wheezes. She has no rales.  Abdominal: Soft. Bowel sounds are normal. She exhibits no distension and no mass. There is no tenderness.  Musculoskeletal: Normal range of motion. She exhibits no edema or tenderness.  Lymphadenopathy:    She has no cervical adenopathy.  Neurological: She is alert. Coordination normal.  Skin: Skin is warm and dry. No rash noted. No erythema.  Psychiatric: She has a normal mood and affect. Her behavior is normal.  Nursing note and vitals reviewed.    ED Treatments / Results  Labs (all labs ordered are listed, but only abnormal results are displayed) Labs  Reviewed  CBG MONITORING, ED - Abnormal; Notable for the following:       Result Value   Glucose-Capillary 298 (*)    All other components within normal limits     Radiology No results found.  Procedures Procedures (including critical care time)  Medications Ordered in ED Medications - No data to display   Initial Impression / Assessment and Plan / ED Course  I have reviewed the triage vital signs and the nursing notes.  Pertinent labs & imaging results that were available during my care of the patient were reviewed by me and considered in my medical decision making (see chart for details).     The patient is well-appearing, no active bleeding, short observation, check sugar and give insulin as needed.  Check with group home to see if she got insulin prior to transport this evening.  Had insulin earlier in evening - has no acute findings and after I replaced  the dressing, no further bleeding Pt appears stable for d/c   Final Clinical Impressions(s) / ED Diagnoses   Final diagnoses:  Problem with dialysis access, initial encounter [facility])    New Prescriptions New Prescriptions   No medications on file     Noemi Chapel, MD 01/23/17 1958

## 2017-07-06 ENCOUNTER — Ambulatory Visit: Payer: Self-pay | Admitting: Podiatry

## 2017-10-04 ENCOUNTER — Emergency Department (HOSPITAL_COMMUNITY): Payer: Medicaid Other

## 2017-10-04 ENCOUNTER — Inpatient Hospital Stay (HOSPITAL_COMMUNITY)
Admission: EM | Admit: 2017-10-04 | Discharge: 2017-10-14 | DRG: 286 | Disposition: A | Discharge status: Skilled Nursing Facility | Payer: Medicaid Other | Attending: Internal Medicine | Admitting: Internal Medicine

## 2017-10-04 ENCOUNTER — Encounter (HOSPITAL_COMMUNITY): Payer: Self-pay | Admitting: *Deleted

## 2017-10-04 DIAGNOSIS — G4710 Hypersomnia, unspecified: Secondary | ICD-10-CM | POA: Diagnosis present

## 2017-10-04 DIAGNOSIS — Y832 Surgical operation with anastomosis, bypass or graft as the cause of abnormal reaction of the patient, or of later complication, without mention of misadventure at the time of the procedure: Secondary | ICD-10-CM | POA: Diagnosis present

## 2017-10-04 DIAGNOSIS — E119 Type 2 diabetes mellitus without complications: Secondary | ICD-10-CM

## 2017-10-04 DIAGNOSIS — I120 Hypertensive chronic kidney disease with stage 5 chronic kidney disease or end stage renal disease: Secondary | ICD-10-CM | POA: Diagnosis present

## 2017-10-04 DIAGNOSIS — W19XXXA Unspecified fall, initial encounter: Secondary | ICD-10-CM | POA: Diagnosis present

## 2017-10-04 DIAGNOSIS — E1122 Type 2 diabetes mellitus with diabetic chronic kidney disease: Secondary | ICD-10-CM | POA: Diagnosis present

## 2017-10-04 DIAGNOSIS — Z794 Long term (current) use of insulin: Secondary | ICD-10-CM

## 2017-10-04 DIAGNOSIS — D631 Anemia in chronic kidney disease: Secondary | ICD-10-CM | POA: Diagnosis present

## 2017-10-04 DIAGNOSIS — N186 End stage renal disease: Secondary | ICD-10-CM | POA: Diagnosis present

## 2017-10-04 DIAGNOSIS — T829XXA Unspecified complication of cardiac and vascular prosthetic device, implant and graft, initial encounter: Secondary | ICD-10-CM

## 2017-10-04 DIAGNOSIS — F319 Bipolar disorder, unspecified: Secondary | ICD-10-CM | POA: Diagnosis present

## 2017-10-04 DIAGNOSIS — E039 Hypothyroidism, unspecified: Secondary | ICD-10-CM | POA: Diagnosis present

## 2017-10-04 DIAGNOSIS — R4182 Altered mental status, unspecified: Secondary | ICD-10-CM

## 2017-10-04 DIAGNOSIS — E875 Hyperkalemia: Secondary | ICD-10-CM | POA: Diagnosis present

## 2017-10-04 DIAGNOSIS — E11649 Type 2 diabetes mellitus with hypoglycemia without coma: Secondary | ICD-10-CM | POA: Diagnosis not present

## 2017-10-04 DIAGNOSIS — I10 Essential (primary) hypertension: Secondary | ICD-10-CM | POA: Diagnosis present

## 2017-10-04 DIAGNOSIS — Z7982 Long term (current) use of aspirin: Secondary | ICD-10-CM

## 2017-10-04 DIAGNOSIS — F209 Schizophrenia, unspecified: Secondary | ICD-10-CM | POA: Diagnosis present

## 2017-10-04 DIAGNOSIS — Z79899 Other long term (current) drug therapy: Secondary | ICD-10-CM

## 2017-10-04 DIAGNOSIS — Z992 Dependence on renal dialysis: Secondary | ICD-10-CM

## 2017-10-04 DIAGNOSIS — T82868A Thrombosis of vascular prosthetic devices, implants and grafts, initial encounter: Principal | ICD-10-CM | POA: Diagnosis present

## 2017-10-04 DIAGNOSIS — I953 Hypotension of hemodialysis: Secondary | ICD-10-CM | POA: Diagnosis not present

## 2017-10-04 DIAGNOSIS — R4702 Dysphasia: Secondary | ICD-10-CM | POA: Diagnosis present

## 2017-10-04 DIAGNOSIS — G9341 Metabolic encephalopathy: Secondary | ICD-10-CM | POA: Diagnosis present

## 2017-10-04 DIAGNOSIS — Z720 Tobacco use: Secondary | ICD-10-CM

## 2017-10-04 LAB — CBC WITH DIFFERENTIAL/PLATELET
BASOS ABS: 0 10*3/uL (ref 0.0–0.1)
Basophils Relative: 0 %
EOS PCT: 4 %
Eosinophils Absolute: 0.2 10*3/uL (ref 0.0–0.7)
HEMATOCRIT: 33.9 % — AB (ref 36.0–46.0)
Hemoglobin: 10.8 g/dL — ABNORMAL LOW (ref 12.0–15.0)
LYMPHS ABS: 1.6 10*3/uL (ref 0.7–4.0)
LYMPHS PCT: 31 %
MCH: 29.4 pg (ref 26.0–34.0)
MCHC: 31.9 g/dL (ref 30.0–36.0)
MCV: 92.4 fL (ref 78.0–100.0)
MONO ABS: 0.5 10*3/uL (ref 0.1–1.0)
MONOS PCT: 10 %
NEUTROS ABS: 2.9 10*3/uL (ref 1.7–7.7)
Neutrophils Relative %: 55 %
PLATELETS: 205 10*3/uL (ref 150–400)
RBC: 3.67 MIL/uL — ABNORMAL LOW (ref 3.87–5.11)
RDW: 15.3 % (ref 11.5–15.5)
WBC: 5.2 10*3/uL (ref 4.0–10.5)

## 2017-10-04 LAB — BASIC METABOLIC PANEL WITH GFR
Anion gap: 10 (ref 5–15)
BUN: 65 mg/dL — ABNORMAL HIGH (ref 6–20)
CO2: 26 mmol/L (ref 22–32)
Calcium: 9.6 mg/dL (ref 8.9–10.3)
Chloride: 96 mmol/L — ABNORMAL LOW (ref 101–111)
Creatinine, Ser: 9.36 mg/dL — ABNORMAL HIGH (ref 0.44–1.00)
GFR calc Af Amer: 5 mL/min — ABNORMAL LOW
GFR calc non Af Amer: 4 mL/min — ABNORMAL LOW
Glucose, Bld: 328 mg/dL — ABNORMAL HIGH (ref 65–99)
Potassium: 6.1 mmol/L — ABNORMAL HIGH (ref 3.5–5.1)
Sodium: 132 mmol/L — ABNORMAL LOW (ref 135–145)

## 2017-10-04 LAB — BASIC METABOLIC PANEL
ANION GAP: 11 (ref 5–15)
BUN: 65 mg/dL — AB (ref 6–20)
CALCIUM: 10 mg/dL (ref 8.9–10.3)
CO2: 26 mmol/L (ref 22–32)
CREATININE: 9.36 mg/dL — AB (ref 0.44–1.00)
Chloride: 97 mmol/L — ABNORMAL LOW (ref 101–111)
GFR calc Af Amer: 5 mL/min — ABNORMAL LOW (ref >60)
GFR calc non Af Amer: 4 mL/min — ABNORMAL LOW (ref >60)
GLUCOSE: 298 mg/dL — AB (ref 65–99)
Potassium: 5.9 mmol/L — ABNORMAL HIGH (ref 3.5–5.1)
Sodium: 134 mmol/L — ABNORMAL LOW (ref 135–145)

## 2017-10-04 LAB — PHOSPHORUS: PHOSPHORUS: 3.6 mg/dL (ref 2.5–4.6)

## 2017-10-04 LAB — CBG MONITORING, ED: GLUCOSE-CAPILLARY: 296 mg/dL — AB (ref 65–99)

## 2017-10-04 LAB — ALBUMIN: ALBUMIN: 3.3 g/dL — AB (ref 3.5–5.0)

## 2017-10-04 LAB — HCG, QUANTITATIVE, PREGNANCY: hCG, Beta Chain, Quant, S: 1 m[IU]/mL (ref <5)

## 2017-10-04 MED ORDER — ACETAMINOPHEN 650 MG RE SUPP: 650.0000 mg | Freq: Four times a day (QID) | RECTAL | Status: DC | PRN

## 2017-10-04 MED ORDER — ONDANSETRON HCL 4 MG/2ML IJ SOLN
4.0000 mg | Freq: Four times a day (QID) | INTRAMUSCULAR | Status: DC | PRN
  Administered 2017-10-10: 4 mg via INTRAVENOUS
  Filled 2017-10-04: qty 2

## 2017-10-04 MED ORDER — ONDANSETRON HCL 4 MG PO TABS
4.0000 mg | ORAL_TABLET | Freq: Four times a day (QID) | ORAL | Status: DC | PRN
  Administered 2017-10-06: 4 mg via ORAL
  Filled 2017-10-04: qty 1

## 2017-10-04 MED ORDER — ACETAMINOPHEN 325 MG PO TABS: 650.0000 mg | ORAL_TABLET | Freq: Four times a day (QID) | ORAL | Status: DC | PRN

## 2017-10-04 MED ORDER — INSULIN ASPART 100 UNIT/ML ~~LOC~~ SOLN
0.0000 [IU] | Freq: Three times a day (TID) | SUBCUTANEOUS | Status: DC
  Administered 2017-10-05: 2 [IU] via SUBCUTANEOUS
  Administered 2017-10-05: 5 [IU] via SUBCUTANEOUS
  Administered 2017-10-06: 2 [IU] via SUBCUTANEOUS
  Administered 2017-10-06: 1 [IU] via SUBCUTANEOUS
  Administered 2017-10-07: 2 [IU] via SUBCUTANEOUS
  Administered 2017-10-07: 1 [IU] via SUBCUTANEOUS
  Administered 2017-10-08: 5 [IU] via SUBCUTANEOUS
  Administered 2017-10-08 – 2017-10-09 (×2): 1 [IU] via SUBCUTANEOUS
  Administered 2017-10-09 – 2017-10-10 (×3): 2 [IU] via SUBCUTANEOUS
  Administered 2017-10-10: 1 [IU] via SUBCUTANEOUS
  Administered 2017-10-11: 2 [IU] via SUBCUTANEOUS
  Administered 2017-10-11 – 2017-10-12 (×2): 1 [IU] via SUBCUTANEOUS
  Administered 2017-10-13: 3 [IU] via SUBCUTANEOUS
  Administered 2017-10-13: 2 [IU] via SUBCUTANEOUS
  Administered 2017-10-14: 5 [IU] via SUBCUTANEOUS
  Administered 2017-10-14: 1 [IU] via SUBCUTANEOUS

## 2017-10-04 MED ORDER — SODIUM BICARBONATE 8.4 % IV SOLN
50.0000 meq | Freq: Once | INTRAVENOUS | Status: AC
  Administered 2017-10-04: 50 meq via INTRAVENOUS
  Filled 2017-10-04: qty 50

## 2017-10-04 MED ORDER — HEPARIN SODIUM (PORCINE) 5000 UNIT/ML IJ SOLN
5000.0000 [IU] | Freq: Three times a day (TID) | INTRAMUSCULAR | Status: AC
  Administered 2017-10-05 – 2017-10-07 (×8): 5000 [IU] via SUBCUTANEOUS
  Filled 2017-10-04 (×8): qty 1

## 2017-10-04 MED ORDER — SODIUM CHLORIDE 0.9 % IV SOLN
1.0000 g | Freq: Once | INTRAVENOUS | Status: AC
  Administered 2017-10-04: 1 g via INTRAVENOUS
  Filled 2017-10-04: qty 10

## 2017-10-04 NOTE — ED Provider Notes (Signed)
[place] Provider Note   CSN: [id number] Arrival date & time: 10/04/17  1740     History   Chief Complaint Chief Complaint  Patient presents with  . Fall    HPI KIORA HALLBERG is a 57 y.o. female.  Level 5 caveat for altered mental status.  Patient resides at [facility] [phone]).  Most of history obtained from nursing assistant at the facility.  Patient has end-stage renal disease with dialysis on Monday Wednesday and Friday.  She apparently had a "clot" in her right arm on Friday and was sent to [facility] for a procedure.  This information is not available to me.  She was acting normally on Saturday and early Sunday morning.  Later on Sunday she became dizzy and was falling down. She stated her right leg hurt.  No prodromal illnesses.  No fever, sweats, chills, stiff neck.     Past Medical History:  Diagnosis Date  . Bipolar 1 disorder ([place])   . Diabetes mellitus without complication ([place])   . Hypertension   . Schizophrenia [facility])     Patient Active Problem List   Diagnosis Date Noted  . Dysphasia   . Altered mental status 10/22/2016  . ESRD (end stage renal disease) ([place]) 10/22/2016  . Bipolar 1 disorder ([place]) 10/22/2016  . Diabetes mellitus ([place]) 10/22/2016  . Essential hypertension 10/22/2016  . Acute encephalopathy 10/22/2016    History reviewed. No pertinent surgical history.   OB History   None      Home Medications    Prior to Admission medications   Medication Sig Start Date End Date Taking? Authorizing Provider  aspirin EC 81 MG tablet Take 81 mg by mouth daily.   Yes [provider]  benztropine (COGENTIN) 1 MG tablet Take 1 mg by mouth.   Yes [provider]  buPROPion (WELLBUTRIN XL) 300 MG 24 hr tablet Take 300 mg by mouth daily.   Yes [provider]  divalproex (DEPAKOTE) 500 MG DR tablet Take 500 mg by mouth at bedtime.    Yes [provider]  docusate sodium  (COLACE) 100 MG capsule Take 100 mg by mouth.   Yes [provider]  fluPHENAZine (PROLIXIN) 5 MG tablet Take 5 mg by mouth.   Yes [provider]  insulin glargine (LANTUS) 100 UNIT/ML injection Inject 0.05 mLs (5 Units total) into the skin at bedtime. 10/24/16  Yes Tat, Shanon Brow, MD  latanoprost (XALATAN) 0.005 % ophthalmic solution Place 1 drop into both eyes at bedtime.    Yes [provider]  LORazepam (ATIVAN) 0.5 MG tablet Take 0.5 mg by mouth 2 (two) times daily. Take at 8:00am and 8:00pm   Yes [provider]  LYRICA 50 MG capsule Take 50 mg by mouth 3 (three) times daily. 09/22/17  Yes [provider]  Multiple Vitamin (MULTIVITAMIN WITH MINERALS) TABS tablet Take 1 tablet by mouth daily.   Yes [provider]  oxyCODONE-acetaminophen (PERCOCET/ROXICET) 5-325 MG tablet Take 1 tablet by mouth every 8 (eight) hours as needed for severe pain.   Yes [provider]  sevelamer carbonate (RENVELA) 800 MG tablet Take 800 mg by mouth 3 (three) times daily with meals.    Yes [provider]  simvastatin (ZOCOR) 40 MG tablet Take 40 mg by mouth daily.   Yes [provider]  acetaminophen (TYLENOL) 500 MG tablet Take 500 mg by mouth every 6 (six) hours as needed.    [provider]  insulin regular (NOVOLIN R,HUMULIN R) 100 units/mL injection Inject into the skin 3 (three) times daily before meals. >200-249 = 2 250-299=4 300-349=6 350-399=8 400-449=10 [phone]=12 >500 ems    [provider]  paliperidone (INVEGA SUSTENNA) 234 MG/1.5ML SUSP injection Inject 234 mg into the muscle every 30 (thirty) days.    [provider]    Family History History reviewed. No pertinent family history.  Social History Social History   Tobacco Use  . Smoking status: Unknown If Ever Smoked  . Smokeless tobacco: Current User  Substance Use Topics  . Alcohol use: No  . Drug use: No     Allergies   Patient has  no known allergies.   Review of Systems Review of Systems  Unable to perform ROS: Mental status change     Physical Exam Updated Vital Signs BP (!) 203/100   Pulse (!) 55   Temp 98 F (36.7 C) (Oral)   Resp 12   SpO2 100%   Physical Exam  Constitutional:  Patient is unable to answer questions.  HENT:  Head: Normocephalic and atraumatic.  Eyes: Conjunctivae are normal.  Neck: Neck supple.  No meningeal signs  Cardiovascular: Normal rate and regular rhythm.  Pulmonary/Chest: Effort normal and breath sounds normal.  Abdominal: Soft. Bowel sounds are normal.  Musculoskeletal:  Unable  Neurological:  Unable  Skin: Skin is warm and dry.  Psychiatric:  Obtunded  Nursing note and vitals reviewed.    ED Treatments / Results  Labs (all labs ordered are listed, but only abnormal results are displayed) Labs Reviewed  CBC WITH DIFFERENTIAL/PLATELET - Abnormal; Notable for the following components:      Result Value   RBC 3.67 (*)    Hemoglobin 10.8 (*)    HCT 33.9 (*)    All other components within normal limits  BASIC METABOLIC PANEL - Abnormal; Notable for the following components:   Sodium 132 (*)    Potassium 6.1 (*)    Chloride 96 (*)    Glucose, Bld 328 (*)    BUN 65 (*)    Creatinine, Ser 9.36 (*)    GFR calc non Af Amer 4 (*)    GFR calc Af Amer 5 (*)    All other components within normal limits  CBG MONITORING, ED - Abnormal; Notable for the following components:   Glucose-Capillary 296 (*)    All other components within normal limits  HCG, QUANTITATIVE, PREGNANCY  RENAL FUNCTION PANEL    EKG None  Radiology Dg Chest 1 View  Result Date: 10/04/2017 CLINICAL DATA:  Patient fell today. Right leg pain. Sleeping more than usual. EXAM: CHEST  1 VIEW COMPARISON:  10/21/2016 FINDINGS: Right central venous catheter with tip over the right atrium. Additional catheter fragment demonstrated in the right axillary region. Shallow inspiration with elevation of  the right hemidiaphragm. Cardiac enlargement with mild vascular congestion. No edema or consolidation. Linear atelectasis in the lung bases. No blunting of costophrenic angles. No pneumothorax. Degenerative changes in the shoulders. IMPRESSION: Shallow inspiration with linear atelectasis in the lung bases. Cardiac enlargement with mild vascular congestion. No edema or consolidation. Electronically Signed   By: Lucienne Capers M.D.   On: 10/04/2017 21:05   Dg Pelvis 1-2 Views  Result Date: 10/04/2017 CLINICAL DATA:  Patient fell today. Right leg pain. Sleeping more than usual. EXAM: PELVIS - 1-2 VIEW COMPARISON:  None. FINDINGS: Mild degenerative changes in the hips. There is no evidence of pelvic fracture or diastasis. No  pelvic bone lesions are seen. Vascular calcifications. Calcified phleboliths in the pelvis. IMPRESSION: No acute bony abnormalities identified. Electronically Signed   By: Lucienne Capers M.D.   On: 10/04/2017 21:04   Ct Head Wo Contrast  Result Date: 10/04/2017 CLINICAL DATA:  Altered mental status. EXAM: CT HEAD WITHOUT CONTRAST CT CERVICAL SPINE WITHOUT CONTRAST TECHNIQUE: Multidetector CT imaging of the head and cervical spine was performed following the standard protocol without intravenous contrast. Multiplanar CT image reconstructions of the cervical spine were also generated. COMPARISON:  Head CT dated 10/21/2016. FINDINGS: CT HEAD FINDINGS Brain: Again noted is generalized parenchymal atrophy with commensurate dilatation of the ventricles and sulci. Ventricles are stable in size. There is no mass, hemorrhage, edema or other evidence of acute parenchymal abnormality. No extra-axial hemorrhage. Vascular: There are chronic calcified atherosclerotic changes of the large vessels at the skull base. No unexpected hyperdense vessel. Skull: Normal. Negative for fracture or focal lesion. Sinuses/Orbits: No acute finding. Other: None. CT CERVICAL SPINE FINDINGS Alignment: Mild scoliosis at  the cervicothoracic junction. No evidence of acute vertebral body subluxation. Skull base and vertebrae: No fracture line or displaced fracture fragment seen. No acute or suspicious osseous lesion. Soft tissues and spinal canal: No prevertebral fluid or swelling. No visible canal hematoma. Disc levels: Mild disc desiccation at C5-6. No significant central canal stenosis at any level. Upper chest: No acute or significant findings. Other: Bilateral carotid atherosclerosis. IMPRESSION: 1. No acute intracranial abnormality. No intracranial mass, hemorrhage or edema. 2. No acute or significant findings within the cervical spine. Mild degenerative change at the C5-6 level. 3. Carotid atherosclerosis. Electronically Signed   By: Franki Cabot M.D.   On: 10/04/2017 20:57   Ct Cervical Spine Wo Contrast  Result Date: 10/04/2017 CLINICAL DATA:  Altered mental status. EXAM: CT HEAD WITHOUT CONTRAST CT CERVICAL SPINE WITHOUT CONTRAST TECHNIQUE: Multidetector CT imaging of the head and cervical spine was performed following the standard protocol without intravenous contrast. Multiplanar CT image reconstructions of the cervical spine were also generated. COMPARISON:  Head CT dated 10/21/2016. FINDINGS: CT HEAD FINDINGS Brain: Again noted is generalized parenchymal atrophy with commensurate dilatation of the ventricles and sulci. Ventricles are stable in size. There is no mass, hemorrhage, edema or other evidence of acute parenchymal abnormality. No extra-axial hemorrhage. Vascular: There are chronic calcified atherosclerotic changes of the large vessels at the skull base. No unexpected hyperdense vessel. Skull: Normal. Negative for fracture or focal lesion. Sinuses/Orbits: No acute finding. Other: None. CT CERVICAL SPINE FINDINGS Alignment: Mild scoliosis at the cervicothoracic junction. No evidence of acute vertebral body subluxation. Skull base and vertebrae: No fracture line or displaced fracture fragment seen. No acute or  suspicious osseous lesion. Soft tissues and spinal canal: No prevertebral fluid or swelling. No visible canal hematoma. Disc levels: Mild disc desiccation at C5-6. No significant central canal stenosis at any level. Upper chest: No acute or significant findings. Other: Bilateral carotid atherosclerosis. IMPRESSION: 1. No acute intracranial abnormality. No intracranial mass, hemorrhage or edema. 2. No acute or significant findings within the cervical spine. Mild degenerative change at the C5-6 level. 3. Carotid atherosclerosis. Electronically Signed   By: Franki Cabot M.D.   On: 10/04/2017 20:57   Dg Femur Portable Min 2 Views Right  Result Date: 10/04/2017 CLINICAL DATA:  Per ED notes: [facility] reports that pt fell today, pt from [facility] and that pt had some right leg pain, pt denies pain at present. Reported that has been sleeping more than usual.  EXAM: RIGHT FEMUR PORTABLE 2 VIEW COMPARISON:  None. FINDINGS: No fracture RIGHT femur. No dislocation. Atherosclerotic calcification noted. IMPRESSION: No fracture or dislocation. Electronically Signed   By: Suzy Bouchard M.D.   On: 10/04/2017 21:02    Procedures Procedures (including critical care time)  Medications Ordered in ED Medications  sodium bicarbonate injection 50 mEq (50 mEq Intravenous Given 10/04/17 2118)  calcium gluconate 1 g in sodium chloride 0.9 % 100 mL IVPB (1 g Intravenous New Bag/Given 10/04/17 2119)     Initial Impression / Assessment and Plan / ED Course  I have reviewed the triage vital signs and the nursing notes.  Pertinent labs & imaging results that were available during my care of the patient were reviewed by me and considered in my medical decision making (see chart for details).     Uncertain etiology of patient's altered mental status.  There is no family here to discuss her situation.  Potassium is 6.1.  CT head and CT cervical spine negative.  Chest x-ray shows no pulmonary edema.  Plain films of  right femur and pelvis were negative for fracture.  Will Rx calcium gluconate 1 g and 1 unit of bicarbonate for hyperkalemia.  Discussed with nephrologist on-call.  Admit to general medicine.   CRITICAL CARE Performed by: Nat Christen Total critical care time: 30 minutes Critical care time was exclusive of separately billable procedures and treating other patients. Critical care was necessary to treat or prevent imminent or life-threatening deterioration. Critical care was time spent personally by me on the following activities: development of treatment plan with patient and/or surrogate as well as nursing, discussions with consultants, evaluation of patient's response to treatment, examination of patient, obtaining history from patient or surrogate, ordering and performing treatments and interventions, ordering and review of laboratory studies, ordering and review of radiographic studies, pulse oximetry and re-evaluation of patient's condition.  Final Clinical Impressions(s) / ED Diagnoses   Final diagnoses:  Altered mental status, unspecified altered mental status type  ESRD (end stage renal disease) [facility])    ED Discharge Orders    None       Nat Christen, MD 10/04/17 2211

## 2017-10-04 NOTE — ED Triage Notes (Signed)
[facility] reports that pt fell today, pt from [facility] and that pt had some right leg pain, pt denies pain at present.  Reported that has been sleeping more than usual.

## 2017-10-04 NOTE — ED Notes (Signed)
Answering service re paging on call Dr

## 2017-10-04 NOTE — ED Notes (Signed)
Attempted x 2 for IV access without success, CN in room to assess

## 2017-10-04 NOTE — H&P (Signed)
History and Physical    [name] IOX:[id number] DOB: April 25, 1960 DOA: 10/04/2017  PCP: Patient, No Pcp Per  Patient coming from: [facility] family care.  I have personally briefly reviewed patient's old medical records in [place]  Chief Complaint: AMS (hypersomnolence).  HPI: [name] is a 57 y.o. female with medical history significant of bipolar 1 disorder, schizophrenia, hypertension, type 2 diabetes who is brought to the emergency department from the [facility] family care home due to altered mental status.  She was seen recently, on Friday, at [facility] for AV fistula thrombosis.  She was reported to be acting normally Saturday and early Sunday morning.  She had a fall earlier today.  No further history is available at this time.    ED Course: Initial vital signs temperature 98 F, pulse 54, respirations 12, blood pressure 195/80 mmHg and O2 sat 100%.  She received 50 mEq of sodium bicarbonate and 1 g of calcium gluconate.  Imaging: Chest radiograph show shallow inspiration with linear atelectasis of the lung bases.  Cardiomegaly with mild vascular congestion, but no edema or consolidation.  Pelvis x-ray, right femur and CT cervical spine did not show any fractures or any other acute abnormalities.  However CT cervical spine showed mild degenerative change at the C5-C6 level and carotid atherosclerosis.  CT head did not show any acute intracranial pathology.  Please see images and full radiology report for further detail.  Review of Systems: Unable to obtain.  Past Medical History:  Diagnosis Date  . Bipolar 1 disorder ([place])   . Diabetes mellitus without complication ([place])   . Hypertension   . Schizophrenia ([place])     History reviewed. No pertinent surgical history.   has an unknown smoking status. She uses smokeless tobacco. She reports that she does not drink alcohol or use drugs.  No Known Allergies  Family history  Unable to obtain due to  AMS.  Prior to Admission medications   Medication Sig Start Date End Date Taking? Authorizing Provider  aspirin EC 81 MG tablet Take 81 mg by mouth daily.   Yes [provider]  benztropine (COGENTIN) 1 MG tablet Take 1 mg by mouth.   Yes [provider]  buPROPion (WELLBUTRIN XL) 300 MG 24 hr tablet Take 300 mg by mouth daily.   Yes [provider]  divalproex (DEPAKOTE) 500 MG DR tablet Take 500 mg by mouth at bedtime.    Yes [provider]  docusate sodium (COLACE) 100 MG capsule Take 100 mg by mouth.   Yes [provider]  fluPHENAZine (PROLIXIN) 5 MG tablet Take 5 mg by mouth.   Yes [provider]  insulin glargine (LANTUS) 100 UNIT/ML injection Inject 0.05 mLs (5 Units total) into the skin at bedtime. 10/24/16  Yes Tat, Shanon Brow, MD  latanoprost (XALATAN) 0.005 % ophthalmic solution Place 1 drop into both eyes at bedtime.    Yes [provider]  LORazepam (ATIVAN) 0.5 MG tablet Take 0.5 mg by mouth 2 (two) times daily. Take at 8:00am and 8:00pm   Yes [provider]  LYRICA 50 MG capsule Take 50 mg by mouth 3 (three) times daily. 09/22/17  Yes [provider]  Multiple Vitamin (MULTIVITAMIN WITH MINERALS) TABS tablet Take 1 tablet by mouth daily.   Yes [provider]  oxyCODONE-acetaminophen (PERCOCET/ROXICET) 5-325 MG tablet Take 1 tablet by mouth every 8 (eight) hours as needed for severe pain.  Yes [provider]  sevelamer carbonate (RENVELA) 800 MG tablet Take 800 mg by mouth 3 (three) times daily with meals.    Yes [provider]  simvastatin (ZOCOR) 40 MG tablet Take 40 mg by mouth daily.   Yes [provider]  acetaminophen (TYLENOL) 500 MG tablet Take 500 mg by mouth every 6 (six) hours as needed.    [provider]  insulin regular (NOVOLIN R,HUMULIN R) 100 units/mL injection Inject into the skin 3 (three) times daily before meals. >200-249 = 2 250-299=4  300-349=6 350-399=8 400-449=10 [phone]=12 >500 ems    [provider]  paliperidone (INVEGA SUSTENNA) 234 MG/1.5ML SUSP injection Inject 234 mg into the muscle every 30 (thirty) days.    [provider]    Physical Exam: Vitals:   10/04/17 1914 10/04/17 1945 10/04/17 2000 10/04/17 2230  BP:   (!) 203/100 (!) 188/105  Pulse: (!) 54 (!) 54 (!) 55 (!) 53  Resp:      Temp:      TempSrc:      SpO2: 100% 100% 100% 100%    Constitutional: NAD, calm, comfortable Eyes: PERRL, lids and conjunctivae normal ENMT: Mucous membranes are mildly dry (mouth breathing?). Posterior pharynx clear of any exudate or lesions. Neck: normal, supple, no masses, no thyromegaly Respiratory: Decreased breath sounds on bases, but otherwise clear to auscultation bilaterally, no wheezing, no crackles. Normal respiratory effort. No accessory muscle use.  Cardiovascular: Bradycardic at 57 bpm, no murmurs / rubs / gallops. No extremity edema. 2+ pedal pulses. No carotid bruits.  Abdomen: no tenderness, no masses palpated. No hepatosplenomegaly. Bowel sounds positive.  Musculoskeletal: no clubbing / cyanosis.  Good ROM, no contractures.  Relaxed muscle tone.  Skin: no clinically significant rashes, lesions, ulcers on very limited dermatological examination. Neurologic: Somnolent.  Moves all extremities, when asked. Psychiatric: Somnolent.  Wakes up briefly knows her name and that she is in the hospital, but unable to provide further details.  Follows simple commands.    Labs on Admission: I have personally reviewed following labs and imaging studies  CBC: Recent Labs  Lab 10/04/17 1819  WBC 5.2  NEUTROABS 2.9  HGB 10.8*  HCT 33.9*  MCV 92.4  PLT 557   Basic Metabolic Panel: Recent Labs  Lab 10/04/17 1819  NA 132*  K 6.1*  CL 96*  CO2 26  GLUCOSE 328*  BUN 65*  CREATININE 9.36*  CALCIUM 9.6  PHOS 3.6   GFR: CrCl cannot be calculated (Unknown ideal weight.). Liver Function  Tests: Recent Labs  Lab 10/04/17 1819  ALBUMIN 3.3*   No results for input(s): LIPASE, AMYLASE in the last 168 hours. No results for input(s): AMMONIA in the last 168 hours. Coagulation Profile: No results for input(s): INR, PROTIME in the last 168 hours. Cardiac Enzymes: No results for input(s): CKTOTAL, CKMB, CKMBINDEX, TROPONINI in the last 168 hours. BNP (last 3 results) No results for input(s): PROBNP in the last 8760 hours. HbA1C: No results for input(s): HGBA1C in the last 72 hours. CBG: Recent Labs  Lab 10/04/17 1751  GLUCAP 296*   Lipid Profile: No results for input(s): CHOL, HDL, LDLCALC, TRIG, CHOLHDL, LDLDIRECT in the last 72 hours. Thyroid Function Tests: No results for input(s): TSH, T4TOTAL, FREET4, T3FREE, THYROIDAB in the last 72 hours. Anemia Panel: No results for input(s): VITAMINB12, FOLATE, FERRITIN, TIBC, IRON, RETICCTPCT in the last 72 hours. Urine analysis:    Component Value Date/Time   COLORURINE Yellow 09/17/2012 1205   APPEARANCEUR  Clear 09/17/2012 1205   LABSPEC 1.013 09/17/2012 1205   PHURINE 9.0 09/17/2012 1205   GLUCOSEU Negative 09/17/2012 1205   HGBUR Negative 09/17/2012 1205   BILIRUBINUR Negative 09/17/2012 [place] 09/17/2012 1205   PROTEINUR 30 mg/dL 09/17/2012 1205   NITRITE Negative 09/17/2012 1205   LEUKOCYTESUR Negative 09/17/2012 1205    Radiological Exams on Admission: Dg Chest 1 View  Result Date: 10/04/2017 CLINICAL DATA:  Patient fell today. Right leg pain. Sleeping more than usual. EXAM: CHEST  1 VIEW COMPARISON:  10/21/2016 FINDINGS: Right central venous catheter with tip over the right atrium. Additional catheter fragment demonstrated in the right axillary region. Shallow inspiration with elevation of the right hemidiaphragm. Cardiac enlargement with mild vascular congestion. No edema or consolidation. Linear atelectasis in the lung bases. No blunting of costophrenic angles. No pneumothorax. Degenerative  changes in the shoulders. IMPRESSION: Shallow inspiration with linear atelectasis in the lung bases. Cardiac enlargement with mild vascular congestion. No edema or consolidation. Electronically Signed   By: Lucienne Capers M.D.   On: 10/04/2017 21:05   Dg Pelvis 1-2 Views  Result Date: 10/04/2017 CLINICAL DATA:  Patient fell today. Right leg pain. Sleeping more than usual. EXAM: PELVIS - 1-2 VIEW COMPARISON:  None. FINDINGS: Mild degenerative changes in the hips. There is no evidence of pelvic fracture or diastasis. No pelvic bone lesions are seen. Vascular calcifications. Calcified phleboliths in the pelvis. IMPRESSION: No acute bony abnormalities identified. Electronically Signed   By: Lucienne Capers M.D.   On: 10/04/2017 21:04   Ct Head Wo Contrast  Result Date: 10/04/2017 CLINICAL DATA:  Altered mental status. EXAM: CT HEAD WITHOUT CONTRAST CT CERVICAL SPINE WITHOUT CONTRAST TECHNIQUE: Multidetector CT imaging of the head and cervical spine was performed following the standard protocol without intravenous contrast. Multiplanar CT image reconstructions of the cervical spine were also generated. COMPARISON:  Head CT dated 10/21/2016. FINDINGS: CT HEAD FINDINGS Brain: Again noted is generalized parenchymal atrophy with commensurate dilatation of the ventricles and sulci. Ventricles are stable in size. There is no mass, hemorrhage, edema or other evidence of acute parenchymal abnormality. No extra-axial hemorrhage. Vascular: There are chronic calcified atherosclerotic changes of the large vessels at the skull base. No unexpected hyperdense vessel. Skull: Normal. Negative for fracture or focal lesion. Sinuses/Orbits: No acute finding. Other: None. CT CERVICAL SPINE FINDINGS Alignment: Mild scoliosis at the cervicothoracic junction. No evidence of acute vertebral body subluxation. Skull base and vertebrae: No fracture line or displaced fracture fragment seen. No acute or suspicious osseous lesion. Soft  tissues and spinal canal: No prevertebral fluid or swelling. No visible canal hematoma. Disc levels: Mild disc desiccation at C5-6. No significant central canal stenosis at any level. Upper chest: No acute or significant findings. Other: Bilateral carotid atherosclerosis. IMPRESSION: 1. No acute intracranial abnormality. No intracranial mass, hemorrhage or edema. 2. No acute or significant findings within the cervical spine. Mild degenerative change at the C5-6 level. 3. Carotid atherosclerosis. Electronically Signed   By: Franki Cabot M.D.   On: 10/04/2017 20:57   Ct Cervical Spine Wo Contrast  Result Date: 10/04/2017 CLINICAL DATA:  Altered mental status. EXAM: CT HEAD WITHOUT CONTRAST CT CERVICAL SPINE WITHOUT CONTRAST TECHNIQUE: Multidetector CT imaging of the head and cervical spine was performed following the standard protocol without intravenous contrast. Multiplanar CT image reconstructions of the cervical spine were also generated. COMPARISON:  Head CT dated 10/21/2016. FINDINGS: CT HEAD FINDINGS Brain: Again noted is generalized parenchymal atrophy with commensurate dilatation  of the ventricles and sulci. Ventricles are stable in size. There is no mass, hemorrhage, edema or other evidence of acute parenchymal abnormality. No extra-axial hemorrhage. Vascular: There are chronic calcified atherosclerotic changes of the large vessels at the skull base. No unexpected hyperdense vessel. Skull: Normal. Negative for fracture or focal lesion. Sinuses/Orbits: No acute finding. Other: None. CT CERVICAL SPINE FINDINGS Alignment: Mild scoliosis at the cervicothoracic junction. No evidence of acute vertebral body subluxation. Skull base and vertebrae: No fracture line or displaced fracture fragment seen. No acute or suspicious osseous lesion. Soft tissues and spinal canal: No prevertebral fluid or swelling. No visible canal hematoma. Disc levels: Mild disc desiccation at C5-6. No significant central canal stenosis  at any level. Upper chest: No acute or significant findings. Other: Bilateral carotid atherosclerosis. IMPRESSION: 1. No acute intracranial abnormality. No intracranial mass, hemorrhage or edema. 2. No acute or significant findings within the cervical spine. Mild degenerative change at the C5-6 level. 3. Carotid atherosclerosis. Electronically Signed   By: Franki Cabot M.D.   On: 10/04/2017 20:57   Dg Femur Portable Min 2 Views Right  Result Date: 10/04/2017 CLINICAL DATA:  Per ED notes: [facility] reports that pt fell today, pt from [facility] and that pt had some right leg pain, pt denies pain at present. Reported that has been sleeping more than usual. EXAM: RIGHT FEMUR PORTABLE 2 VIEW COMPARISON:  None. FINDINGS: No fracture RIGHT femur. No dislocation. Atherosclerotic calcification noted. IMPRESSION: No fracture or dislocation. Electronically Signed   By: Suzy Bouchard M.D.   On: 10/04/2017 21:02    EKG: Independently reviewed.  Vent. rate 54 BPM PR interval * ms QRS duration 96 ms QT/QTc 428/406 ms P-R-T axes 71 34 16 Sinus rhythm Borderline T wave abnormalities  Assessment/Plan Principal Problem:   Altered mental status Unknown reason for encephalopathic state. She has been afebrile, but hypersomnolent. She had a similar episode in July last year after HD. Labs and CT head do not show any clear etiology so far. Keep n.p.o. and hold medications for now. Continue IV fluids. Neuro checks every 4 hours.  Active Problems:   ESRD (end stage renal disease) ([place]) On dialysis Monday/Wednesday/Friday. Consult nephrology in the morning    Hyperkalemia Received bicarbonate and calcium gluconate in the ED. This was repeated after follow-up potassium was 5.9 mmol S/L. Kayexalate enema was ordered. Consult nephrology in a.m. Will follow up potassium level.    Bipolar 1 disorder (H[facility])   Schizophrenia ([place]) Psychotropics and sedatives have been held for now due to AMS.     Diabetes mellitus ([place]) On Lantus 5 units SQ at bedtime. Currently n.p.o.,  Blood glucose in the 200-300s range. Continue low-dose Lantus tonight. Monitor CBG. Most likely will need a sliding scale once tolerating diet.    Essential hypertension Hold oral antihypertensives due to AMS. Monitor blood pressure. Hydralazine 20 mg IVP every 4 hours as needed for SBP over 160 mmHg.    Anemia in ESRD (end-stage renal disease) (HCC) Monitor hematocrit and hemoglobin. Erythropoietin per nephrology.    DVT prophylaxis: Lovenox SQ. Code Status: Full code. Family Communication: None at bedside. Disposition Plan: Observation stepdown for treatment and close monitoring. Consults called: Routine nephrology consult Admission status: Observation/telemetry.   Reubin Milan MD [facility] Pager [phone]  If 7PM-7AM, please contact night-coverage [email or web address] Password [email or web address]  10/04/2017, 11:16 PM

## 2017-10-04 NOTE — ED Notes (Signed)
Patient transported to CT 

## 2017-10-05 ENCOUNTER — Encounter (HOSPITAL_COMMUNITY): Payer: Self-pay | Admitting: Emergency Medicine

## 2017-10-05 ENCOUNTER — Other Ambulatory Visit: Payer: Self-pay

## 2017-10-05 DIAGNOSIS — Z720 Tobacco use: Secondary | ICD-10-CM | POA: Diagnosis not present

## 2017-10-05 DIAGNOSIS — F319 Bipolar disorder, unspecified: Secondary | ICD-10-CM | POA: Diagnosis not present

## 2017-10-05 DIAGNOSIS — T82868A Thrombosis of vascular prosthetic devices, implants and grafts, initial encounter: Secondary | ICD-10-CM

## 2017-10-05 DIAGNOSIS — I953 Hypotension of hemodialysis: Secondary | ICD-10-CM | POA: Diagnosis not present

## 2017-10-05 DIAGNOSIS — Z79899 Other long term (current) drug therapy: Secondary | ICD-10-CM | POA: Diagnosis not present

## 2017-10-05 DIAGNOSIS — W19XXXA Unspecified fall, initial encounter: Secondary | ICD-10-CM | POA: Diagnosis present

## 2017-10-05 DIAGNOSIS — Z7982 Long term (current) use of aspirin: Secondary | ICD-10-CM | POA: Diagnosis not present

## 2017-10-05 DIAGNOSIS — R4702 Dysphasia: Secondary | ICD-10-CM | POA: Diagnosis present

## 2017-10-05 DIAGNOSIS — T82868D Thrombosis of vascular prosthetic devices, implants and grafts, subsequent encounter: Secondary | ICD-10-CM | POA: Diagnosis not present

## 2017-10-05 DIAGNOSIS — Y832 Surgical operation with anastomosis, bypass or graft as the cause of abnormal reaction of the patient, or of later complication, without mention of misadventure at the time of the procedure: Secondary | ICD-10-CM | POA: Diagnosis present

## 2017-10-05 DIAGNOSIS — G9341 Metabolic encephalopathy: Secondary | ICD-10-CM | POA: Diagnosis not present

## 2017-10-05 DIAGNOSIS — E039 Hypothyroidism, unspecified: Secondary | ICD-10-CM | POA: Diagnosis present

## 2017-10-05 DIAGNOSIS — E11649 Type 2 diabetes mellitus with hypoglycemia without coma: Secondary | ICD-10-CM | POA: Diagnosis not present

## 2017-10-05 DIAGNOSIS — N186 End stage renal disease: Secondary | ICD-10-CM

## 2017-10-05 DIAGNOSIS — E875 Hyperkalemia: Secondary | ICD-10-CM | POA: Diagnosis present

## 2017-10-05 DIAGNOSIS — I120 Hypertensive chronic kidney disease with stage 5 chronic kidney disease or end stage renal disease: Secondary | ICD-10-CM | POA: Diagnosis present

## 2017-10-05 DIAGNOSIS — G4710 Hypersomnia, unspecified: Secondary | ICD-10-CM | POA: Diagnosis present

## 2017-10-05 DIAGNOSIS — R4182 Altered mental status, unspecified: Secondary | ICD-10-CM | POA: Diagnosis not present

## 2017-10-05 DIAGNOSIS — F209 Schizophrenia, unspecified: Secondary | ICD-10-CM | POA: Diagnosis not present

## 2017-10-05 DIAGNOSIS — Z794 Long term (current) use of insulin: Secondary | ICD-10-CM | POA: Diagnosis not present

## 2017-10-05 DIAGNOSIS — Z992 Dependence on renal dialysis: Secondary | ICD-10-CM | POA: Diagnosis not present

## 2017-10-05 DIAGNOSIS — D631 Anemia in chronic kidney disease: Secondary | ICD-10-CM | POA: Diagnosis not present

## 2017-10-05 DIAGNOSIS — E1122 Type 2 diabetes mellitus with diabetic chronic kidney disease: Secondary | ICD-10-CM | POA: Diagnosis present

## 2017-10-05 DIAGNOSIS — I10 Essential (primary) hypertension: Secondary | ICD-10-CM | POA: Diagnosis not present

## 2017-10-05 LAB — COMPREHENSIVE METABOLIC PANEL
ALK PHOS: 96 U/L (ref 38–126)
ALT: 14 U/L (ref 14–54)
AST: 16 U/L (ref 15–41)
Albumin: 3.3 g/dL — ABNORMAL LOW (ref 3.5–5.0)
Anion gap: 11 (ref 5–15)
BUN: 69 mg/dL — ABNORMAL HIGH (ref 6–20)
CALCIUM: 9.8 mg/dL (ref 8.9–10.3)
CO2: 24 mmol/L (ref 22–32)
CREATININE: 9.39 mg/dL — AB (ref 0.44–1.00)
Chloride: 103 mmol/L (ref 101–111)
GFR calc Af Amer: 5 mL/min — ABNORMAL LOW (ref >60)
GFR, EST NON AFRICAN AMERICAN: 4 mL/min — AB (ref >60)
Glucose, Bld: 228 mg/dL — ABNORMAL HIGH (ref 65–99)
Potassium: 5.7 mmol/L — ABNORMAL HIGH (ref 3.5–5.1)
SODIUM: 138 mmol/L (ref 135–145)
Total Bilirubin: 0.5 mg/dL (ref 0.3–1.2)
Total Protein: 7.6 g/dL (ref 6.5–8.1)

## 2017-10-05 LAB — GLUCOSE, CAPILLARY
GLUCOSE-CAPILLARY: 177 mg/dL — AB (ref 65–99)
Glucose-Capillary: 108 mg/dL — ABNORMAL HIGH (ref 65–99)
Glucose-Capillary: 139 mg/dL — ABNORMAL HIGH (ref 65–99)
Glucose-Capillary: 256 mg/dL — ABNORMAL HIGH (ref 65–99)
Glucose-Capillary: 272 mg/dL — ABNORMAL HIGH (ref 65–99)

## 2017-10-05 LAB — CBC
HCT: 35.6 % — ABNORMAL LOW (ref 36.0–46.0)
Hemoglobin: 11.6 g/dL — ABNORMAL LOW (ref 12.0–15.0)
MCH: 30 pg (ref 26.0–34.0)
MCHC: 32.6 g/dL (ref 30.0–36.0)
MCV: 92 fL (ref 78.0–100.0)
PLATELETS: 166 10*3/uL (ref 150–400)
RBC: 3.87 MIL/uL (ref 3.87–5.11)
RDW: 15.4 % (ref 11.5–15.5)
WBC: 5.6 10*3/uL (ref 4.0–10.5)

## 2017-10-05 LAB — AMMONIA: Ammonia: 30 umol/L (ref 9–35)

## 2017-10-05 LAB — MRSA PCR SCREENING: MRSA BY PCR: NEGATIVE

## 2017-10-05 LAB — PROTIME-INR
INR: 1.08
PROTHROMBIN TIME: 13.9 s (ref 11.4–15.2)

## 2017-10-05 LAB — VALPROIC ACID LEVEL: Valproic Acid Lvl: 22 ug/mL — ABNORMAL LOW (ref 50.0–100.0)

## 2017-10-05 MED ORDER — ALTEPLASE 2 MG IJ SOLR
INTRAMUSCULAR | Status: AC
  Administered 2017-10-05: 4 mg
  Filled 2017-10-05: qty 4

## 2017-10-05 MED ORDER — ALTEPLASE 2 MG IJ SOLR
2.0000 mg | Freq: Once | INTRAMUSCULAR | Status: AC | PRN
  Administered 2017-10-05: 4 mg
  Filled 2017-10-05: qty 2

## 2017-10-05 MED ORDER — SODIUM BICARBONATE 8.4 % IV SOLN
50.0000 meq | Freq: Once | INTRAVENOUS | Status: DC
  Filled 2017-10-05: qty 50

## 2017-10-05 MED ORDER — CHLORHEXIDINE GLUCONATE CLOTH 2 % EX PADS
6.0000 | MEDICATED_PAD | Freq: Every day | CUTANEOUS | Status: DC
  Administered 2017-10-05 – 2017-10-12 (×5): 6 via TOPICAL

## 2017-10-05 MED ORDER — LIDOCAINE HCL (PF) 1 % IJ SOLN: 5.0000 mL | INTRAMUSCULAR | Status: DC | PRN

## 2017-10-05 MED ORDER — HYDRALAZINE HCL 20 MG/ML IJ SOLN
20.0000 mg | INTRAMUSCULAR | Status: DC | PRN
  Administered 2017-10-05: 20 mg via INTRAVENOUS
  Filled 2017-10-05: qty 1

## 2017-10-05 MED ORDER — SODIUM CHLORIDE 0.9 % IV SOLN: 100.0000 mL | INTRAVENOUS | Status: DC | PRN

## 2017-10-05 MED ORDER — HYDRALAZINE HCL 20 MG/ML IJ SOLN
10.0000 mg | INTRAMUSCULAR | Status: DC | PRN
  Administered 2017-10-08 – 2017-10-10 (×3): 10 mg via INTRAVENOUS
  Filled 2017-10-05 (×3): qty 1

## 2017-10-05 MED ORDER — HEPARIN SODIUM (PORCINE) 1000 UNIT/ML DIALYSIS
1000.0000 [IU] | INTRAMUSCULAR | Status: DC | PRN
  Administered 2017-10-06: 1000 [IU] via INTRAVENOUS_CENTRAL
  Administered 2017-10-10: 3200 [IU] via INTRAVENOUS_CENTRAL
  Filled 2017-10-05 (×3): qty 1

## 2017-10-05 MED ORDER — LIDOCAINE-PRILOCAINE 2.5-2.5 % EX CREA
1.0000 "application " | TOPICAL_CREAM | CUTANEOUS | Status: DC | PRN
  Filled 2017-10-05: qty 5

## 2017-10-05 MED ORDER — SODIUM POLYSTYRENE SULFONATE 15 GM/60ML PO SUSP
45.0000 g | Freq: Once | ORAL | Status: AC
  Administered 2017-10-05: 45 g via RECTAL
  Filled 2017-10-05: qty 180

## 2017-10-05 MED ORDER — PENTAFLUOROPROP-TETRAFLUOROETH EX AERO
1.0000 | INHALATION_SPRAY | CUTANEOUS | Status: DC | PRN
Start: 2017-10-05 — End: 2017-10-14
  Filled 2017-10-05: qty 30

## 2017-10-05 MED ORDER — LATANOPROST 0.005 % OP SOLN
1.0000 [drp] | Freq: Every day | OPHTHALMIC | Status: DC
  Administered 2017-10-05 – 2017-10-13 (×3): 1 [drp] via OPHTHALMIC
  Filled 2017-10-05 (×3): qty 2.5

## 2017-10-05 MED ORDER — STERILE WATER FOR INJECTION IJ SOLN
INTRAMUSCULAR | Status: AC
  Administered 2017-10-05: 4.4 mL
  Filled 2017-10-05: qty 10

## 2017-10-05 MED ORDER — INSULIN GLARGINE 100 UNIT/ML ~~LOC~~ SOLN
5.0000 [IU] | Freq: Every day | SUBCUTANEOUS | Status: DC
  Administered 2017-10-05 – 2017-10-13 (×10): 5 [IU] via SUBCUTANEOUS
  Filled 2017-10-05 (×11): qty 0.05

## 2017-10-05 NOTE — NC FL2 (Signed)
[place] LEVEL OF CARE SCREENING TOOL     IDENTIFICATION  Patient Name: [name] Birthdate: 09-04-1960 Sex: female Admission Date (Current Location): 10/04/2017  [place] and [place] Number:  [place] [id number] [place] and Address:  [place] [address], [place]      Provider Number: [id number]  Attending Physician Name and Address:  Isaac Bliss, [place]  Relative Name and Phone Number:  Sherese Heyward (sister) [phone]    Current Level of Care: Hospital Recommended Level of Care: [place] Prior Approval Number:    Date Approved/Denied:   PASRR Number: pending  Discharge Plan: SNF    Current Diagnoses: Patient Active Problem List   Diagnosis Date Noted  . Schizophrenia ([place]) 10/04/2017  . Hyperkalemia 10/04/2017  . Anemia in ESRD (end-stage renal disease) ([place]) 10/04/2017  . Dysphasia   . Altered mental status 10/22/2016  . ESRD (end stage renal disease) ([place]) 10/22/2016  . Bipolar 1 disorder ([place]) 10/22/2016  . Diabetes mellitus ([place]) 10/22/2016  . Essential hypertension 10/22/2016  . Acute encephalopathy 10/22/2016    Orientation RESPIRATION BLADDER Height & Weight     Self  Normal Continent Weight: 147 lb 14.9 oz (67.1 kg) Height:  5' (152.4 cm)  BEHAVIORAL SYMPTOMS/MOOD NEUROLOGICAL BOWEL NUTRITION STATUS      Continent Diet(see dc summary)  AMBULATORY STATUS COMMUNICATION OF NEEDS Skin   Limited Assist Verbally Normal                       Personal Care Assistance Level of Assistance  Bathing, Feeding, Dressing Bathing Assistance: Limited assistance Feeding assistance: Limited assistance Dressing Assistance: Limited assistance     Functional Limitations Info  Sight, Hearing, Speech Sight Info: Adequate Hearing Info: Adequate Speech Info: Adequate    SPECIAL CARE FACTORS FREQUENCY                       Contractures Contractures Info: Not present     Additional Factors Info  Code Status, Allergies, Psychotropic Code Status Info: full Allergies Info: No Known Allergies Psychotropic Info: Wellbutrin, Depakote, Ativan, Paliperidone         Current Medications (10/05/2017):  This is the current hospital active medication list Current Facility-Administered Medications  Medication Dose Route Frequency Provider Last Rate Last Dose  . 0.9 %  sodium chloride infusion  100 mL Intravenous PRN Befekadu, Eli Phillips, MD      . 0.9 %  sodium chloride infusion  100 mL Intravenous PRN Fran Lowes, MD      . acetaminophen (TYLENOL) tablet 650 mg  650 mg Oral Q6H PRN Reubin Milan, MD       Or  . acetaminophen (TYLENOL) suppository 650 mg  650 mg Rectal Q6H PRN Reubin Milan, MD      . alteplase (CATHFLO ACTIVASE) injection 2 mg  2 mg Intracatheter Once PRN Fran Lowes, MD      . Chlorhexidine Gluconate Cloth 2 % PADS 6 each  6 each Topical Q0600 Fran Lowes, MD   6 each at 10/05/17 0856  . heparin injection 1,000 Units  1,000 Units Dialysis PRN Fran Lowes, MD      . heparin injection 5,000 Units  5,000 Units Subcutaneous Q8H Reubin Milan, MD   5,000 Units at 10/05/17 [phone]  . hydrALAZINE (APRESOLINE) injection 10 mg  10 mg Intravenous Q4H PRN Reubin Milan, MD      . insulin aspart (novoLOG)  injection 0-9 Units  0-9 Units Subcutaneous TID WC Reubin Milan, MD   5 Units at 10/05/17 [phone]  . insulin glargine (LANTUS) injection 5 Units  5 Units Subcutaneous QHS Reubin Milan, MD   5 Units at 10/05/17 [phone]  . latanoprost (XALATAN) 0.005 % ophthalmic solution 1 drop  1 drop Both Eyes QHS Reubin Milan, MD   1 drop at 10/05/17 8115  . lidocaine (PF) (XYLOCAINE) 1 % injection 5 mL  5 mL Intradermal PRN Fran Lowes, MD      . lidocaine-prilocaine (EMLA) cream 1 application  1 application Topical PRN Fran Lowes, MD      . ondansetron (ZOFRAN) tablet 4 mg  4 mg Oral Q6H PRN  Reubin Milan, MD       Or  . ondansetron [facility]) injection 4 mg  4 mg Intravenous Q6H PRN Reubin Milan, MD      . pentafluoroprop-tetrafluoroeth [facility]) aerosol 1 application  1 application Topical PRN Fran Lowes, MD      . sodium bicarbonate injection 50 mEq  50 mEq Intravenous Once Reubin Milan, MD         Discharge Medications: Please see discharge summary for a list of discharge medications.  Relevant Imaging Results:  Relevant Lab Results:   Additional Information SSN: [id number] [place]

## 2017-10-05 NOTE — Progress Notes (Signed)
PROGRESS NOTE    [name]  XMI:[id number] DOB: Nov 06, 1960 DOA: 10/04/2017 PCP: Patient, No Pcp Per     Brief Narrative:  57 year old woman admitted on 6/16 due to acute confusion.  She has a history of end-stage renal disease on dialysis, schizophrenia, hypertension and diabetes who currently resides at a family care home.  On Friday she was seen at [facility] for AV fistula thrombosis, it appears that she missed dialysis that day.  She is brought into the hospital today due to hypersomnolence where she is found to be hyperkalemic and admission is requested.   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 -We have not been able to identify any infectious sources at play, I do not favor CVA given this is supra tentorial and she has no focal deficits. -CT head without acute abnormalities.  End-stage renal disease -With hyperkalemia due to missed dialysis sessions. -Did receive calcium gluconate and bicarbonate in the ED.  Follow up potassium 5.9.  Kayexalate enema ordered. -Patient's AV fistula is currently not working. -Dr. Constance Haw has seen it has placed a temporary hemodialysis catheter for emergent dialysis today, will need plans for permanent tunneled dialysis catheter prior to discharge.  Bipolar 1 disorder/schizophrenia -Psychotropics and sedatives have been on hold for now due to acute encephalopathy.  Essential hypertension -Should improve after hemodialysis, meds are on hold due to encephalopathy.  Has an order for PRN hydralazine as needed.  Diabetes -Well-controlled, continue  current management.   DVT prophylaxis: Subcutaneous heparin Code Status: Full code Family Communication: Patient only Disposition Plan: Hopefully back to family care home with clinical improvement  Consultants:   Nephrology  General surgery  Procedures:   Temporary hemodialysis catheter placement on 6/17  Antimicrobials:  Anti-infectives (From admission, onward)   None       Subjective: Lying in bed, very drowsy, can open eyes briefly but falls quickly back to sleep.  Objective: Vitals:   10/05/17 1118 10/05/17 1200 10/05/17 1300 10/05/17 1638  BP:  (!) 155/78 136/68   Pulse: 61 (!) 57 (!) 50   Resp: 18 14 (!) 8   Temp: (!) 97.4 F (36.3 C)   (!) 95.8 F (35.4 C)  TempSrc: Axillary   Rectal  SpO2: 99% 99% 100%   Weight:      Height:        Intake/Output Summary (Last 24 hours) at 10/05/2017 1647 Last data filed at 10/04/2017 2300 Gross per 24 hour  Intake 350 ml  Output -  Net 350 ml   Filed Weights   10/05/17 0038 10/05/17 0400  Weight: 67.1 kg (147 lb 14.9 oz) 67.1 kg (147 lb 14.9 oz)    Examination:  General exam: Drowsy, unable to assess orientation Respiratory system: Clear to auscultation. Respiratory effort normal. Cardiovascular system:RRR. No murmurs, rubs, gallops. Gastrointestinal system: Abdomen is nondistended, soft and nontender. No organomegaly or masses felt. Normal bowel sounds heard. Central nervous system: Unable to assess given current mental state Extremities: No C/C/E, +pedal pulses Skin: No rashes, lesions or ulcers Psychiatry: Unable to assess given current mental state  Data Reviewed: I have personally reviewed following labs and imaging studies  CBC: Recent Labs  Lab 10/04/17 1819 10/05/17 0428  WBC 5.2 5.6  NEUTROABS 2.9  --   HGB 10.8* 11.6*  HCT 33.9* 35.6*  MCV 92.4 92.0  PLT 205 875   Basic Metabolic Panel: Recent Labs  Lab 10/04/17 1819 10/04/17 2320 10/05/17 0428  NA 132* 134* 138  K 6.1* 5.9*  5.7*  CL 96* 97* 103  CO2 26 26 24   GLUCOSE 328* 298* 228*  BUN 65* 65* 69*  CREATININE 9.36* 9.36* 9.39*  CALCIUM 9.6 10.0 9.8  PHOS 3.6  --   --    GFR: Estimated Creatinine Clearance: 5.7 mL/min (A) (by C-G formula based on SCr of 9.39 mg/dL (H)). Liver Function Tests: Recent Labs  Lab 10/04/17 1819 10/05/17 0428  AST  --  16  ALT  --  14  ALKPHOS  --  96  BILITOT  --  0.5  PROT  --  7.6  ALBUMIN 3.3* 3.3*   No results for input(s): LIPASE, AMYLASE in the last 168 hours. Recent Labs  Lab 10/05/17 0428  AMMONIA 30   Coagulation Profile: Recent Labs  Lab 10/05/17 1009  INR 1.08   Cardiac Enzymes: No results for input(s): CKTOTAL, CKMB, CKMBINDEX, TROPONINI in the last 168 hours. BNP (last 3 results) No results for input(s): PROBNP in the last 8760 hours. HbA1C: No results for input(s): HGBA1C in the last 72 hours. CBG: Recent Labs  Lab 10/04/17 1751 10/05/17 0050 10/05/17 0713 10/05/17 1115  GLUCAP 296* 272* 256* 177*   Lipid Profile: No results for input(s): CHOL, HDL, LDLCALC, TRIG, CHOLHDL, LDLDIRECT in the last 72 hours. Thyroid Function Tests: No results for input(s): TSH, T4TOTAL, FREET4, T3FREE, THYROIDAB in the last 72 hours. Anemia Panel: No results for input(s): VITAMINB12, FOLATE, FERRITIN, TIBC, IRON, RETICCTPCT in the last 72 hours. Urine analysis:    Component Value Date/Time   COLORURINE Yellow 09/17/2012 1205   APPEARANCEUR Clear 09/17/2012 1205   LABSPEC 1.013 09/17/2012 1205   PHURINE 9.0 09/17/2012 1205   GLUCOSEU Negative 09/17/2012 1205   HGBUR Negative 09/17/2012 1205   BILIRUBINUR Negative 09/17/2012 1205   KETONESUR Trace 09/17/2012 1205   PROTEINUR 30 mg/dL 09/17/2012 1205   NITRITE Negative 09/17/2012 1205   LEUKOCYTESUR Negative 09/17/2012 1205   Sepsis Labs: @LABRCNTIP (procalcitonin:4,lacticidven:4)  ) Recent Results (from the past 240 hour(s))  MRSA PCR Screening     Status: None   Collection Time: 10/05/17  12:44 AM  Result Value Ref Range Status   MRSA by PCR NEGATIVE NEGATIVE Final    Comment:        The GeneXpert MRSA Assay (FDA approved for NASAL specimens only), is one component of a comprehensive MRSA colonization surveillance program. It is not intended to diagnose MRSA infection nor to guide or monitor treatment for MRSA infections. Performed at [facility], [address], [place], [place] [address]          Radiology Studies: Dg Chest 1 View  Result Date: 10/04/2017 CLINICAL DATA:  Patient fell today. Right leg pain. Sleeping more than usual. EXAM: CHEST  1 VIEW COMPARISON:  10/21/2016 FINDINGS: Right central venous catheter with tip over the right atrium. Additional catheter fragment demonstrated in the right axillary region. Shallow inspiration with elevation of the right hemidiaphragm. Cardiac enlargement with mild vascular congestion. No edema or consolidation. Linear atelectasis in the lung bases. No blunting of costophrenic angles. No pneumothorax. Degenerative changes in the  shoulders. IMPRESSION: Shallow inspiration with linear atelectasis in the lung bases. Cardiac enlargement with mild vascular congestion. No edema or consolidation. Electronically Signed   By: Lucienne Capers M.D.   On: 10/04/2017 21:05   Dg Pelvis 1-2 Views  Result Date: 10/04/2017 CLINICAL DATA:  Patient fell today. Right leg pain. Sleeping more than usual. EXAM: PELVIS - 1-2 VIEW COMPARISON:  None. FINDINGS: Mild degenerative changes in the hips. There is no evidence of pelvic fracture or diastasis. No pelvic bone lesions are seen. Vascular calcifications. Calcified phleboliths in the pelvis. IMPRESSION: No acute bony abnormalities identified. Electronically Signed   By: Lucienne Capers M.D.   On: 10/04/2017 21:04   Ct Head Wo Contrast  Result Date: 10/04/2017 CLINICAL DATA:  Altered mental status. EXAM: CT HEAD WITHOUT CONTRAST CT CERVICAL SPINE WITHOUT CONTRAST TECHNIQUE: Multidetector CT  imaging of the head and cervical spine was performed following the standard protocol without intravenous contrast. Multiplanar CT image reconstructions of the cervical spine were also generated. COMPARISON:  Head CT dated 10/21/2016. FINDINGS: CT HEAD FINDINGS Brain: Again noted is generalized parenchymal atrophy with commensurate dilatation of the ventricles and sulci. Ventricles are stable in size. There is no mass, hemorrhage, edema or other evidence of acute parenchymal abnormality. No extra-axial hemorrhage. Vascular: There are chronic calcified atherosclerotic changes of the large vessels at the skull base. No unexpected hyperdense vessel. Skull: Normal. Negative for fracture or focal lesion. Sinuses/Orbits: No acute finding. Other: None. CT CERVICAL SPINE FINDINGS Alignment: Mild scoliosis at the cervicothoracic junction. No evidence of acute vertebral body subluxation. Skull base and vertebrae: No fracture line or displaced fracture fragment seen. No acute or suspicious osseous lesion. Soft tissues and spinal canal: No prevertebral fluid or swelling. No visible canal hematoma. Disc levels: Mild disc desiccation at C5-6. No significant central canal stenosis at any level. Upper chest: No acute or significant findings. Other: Bilateral carotid atherosclerosis. IMPRESSION: 1. No acute intracranial abnormality. No intracranial mass, hemorrhage or edema. 2. No acute or significant findings within the cervical spine. Mild degenerative change at the C5-6 level. 3. Carotid atherosclerosis. Electronically Signed   By: Franki Cabot M.D.   On: 10/04/2017 20:57   Ct Cervical Spine Wo Contrast  Result Date: 10/04/2017 CLINICAL DATA:  Altered mental status. EXAM: CT HEAD WITHOUT CONTRAST CT CERVICAL SPINE WITHOUT CONTRAST TECHNIQUE: Multidetector CT imaging of the head and cervical spine was performed following the standard protocol without intravenous contrast. Multiplanar CT image reconstructions of the cervical  spine were also generated. COMPARISON:  Head CT dated 10/21/2016. FINDINGS: CT HEAD FINDINGS Brain: Again noted is generalized parenchymal atrophy with commensurate dilatation of the ventricles and sulci. Ventricles are stable in size. There is no mass, hemorrhage, edema or other evidence of acute parenchymal abnormality. No extra-axial hemorrhage. Vascular: There are chronic calcified atherosclerotic changes of the large vessels at the skull base. No unexpected hyperdense vessel. Skull: Normal. Negative for fracture or focal lesion. Sinuses/Orbits: No acute finding. Other: None. CT CERVICAL SPINE FINDINGS Alignment: Mild scoliosis at the cervicothoracic junction. No evidence of acute vertebral body subluxation. Skull base and vertebrae: No fracture line or displaced fracture fragment seen. No acute or suspicious osseous lesion. Soft tissues and spinal canal: No prevertebral fluid or swelling. No visible canal hematoma. Disc levels: Mild disc desiccation at C5-6. No significant central canal stenosis at any level. Upper chest: No acute or significant findings. Other: Bilateral carotid atherosclerosis. IMPRESSION: 1. No acute intracranial abnormality. No intracranial mass, hemorrhage or edema. 2. No  acute or significant findings within the cervical spine. Mild degenerative change at the C5-6 level. 3. Carotid atherosclerosis. Electronically Signed   By: Franki Cabot M.D.   On: 10/04/2017 20:57   Dg Femur Portable Min 2 Views Right  Result Date: 10/04/2017 CLINICAL DATA:  Per ED notes: [facility] reports that pt fell today, pt from [facility] and that pt had some right leg pain, pt denies pain at present. Reported that has been sleeping more than usual. EXAM: RIGHT FEMUR PORTABLE 2 VIEW COMPARISON:  None. FINDINGS: No fracture RIGHT femur. No dislocation. Atherosclerotic calcification noted. IMPRESSION: No fracture or dislocation. Electronically Signed   By: Suzy Bouchard M.D.   On: 10/04/2017 21:02         Scheduled Meds: . Chlorhexidine Gluconate Cloth  6 each Topical Q0600  . heparin  5,000 Units Subcutaneous Q8H  . insulin aspart  0-9 Units Subcutaneous TID WC  . insulin glargine  5 Units Subcutaneous QHS  . latanoprost  1 drop Both Eyes QHS  . sodium bicarbonate  50 mEq Intravenous Once   Continuous Infusions: . sodium chloride    . sodium chloride       LOS: 0 days    The patient is critically ill with multiple organ systems failure and requires high complexity decision making for assessment and support, frequent evaluation and titration of therapies, application of advanced monitoring technologies and extensive interpretation of multiple databases.  Critical care time -  40 mins.        Lelon Frohlich, MD [facility] Pager [phone]  If 7PM-7AM, please contact night-coverage [email or web address] Password [facility] 10/05/2017, 4:47 PM

## 2017-10-05 NOTE — Clinical Social Work Note (Addendum)
Clinical Social Work Assessment  Patient Details  Name: [name] MRN: [id number] Date of Birth: 07-27-1960  Date of referral:  10/05/17               Reason for consult:  Discharge Planning                Permission sought to share information with:    Permission granted to share information::     Name::        Agency::     Relationship::     Contact Information:     Housing/Transportation Living arrangements for the past 2 months:  [place] of Information:  Facility Patient Interpreter Needed:  None Criminal Activity/Legal Involvement Pertinent to Current Situation/Hospitalization:  No - Comment as needed Significant Relationships:  Siblings, Other Family Members Lives with:  Facility Resident Do you feel safe going back to the place where you live?  Yes Need for family participation in patient care:  No (Coment)  Care giving concerns: Pt resides at [facility]   Social Worker assessment / plan: Pt is a 57 year old female referred to [place] as she is a resident at [facility]. Reviewed pt's record today and pt was discussed in Progression. Per MD, pt will remain hospitalized for at least a couple more days. Spoke with a caregiver at the [facility] as pt unable to participate in assessment. At baseline, pt is reportedly mostly independent in ADL's. Pt does receive some assistance with bathing. Pt's siblings and her aunt are her family supports. Pt receives outpatient dialysis on MWF schedule and is transported by [facility] transport. [facility] staff will transport pt at dc. They can be reached at [phone].  After speaking with caregiver, spoke with administrator, Mr. Humphries, who states that he feels pt needs a higher level of care at dc. He states she has been falling and having confusion. He states that she has multiple medical issues and he doesn't feel that the [facility] is equipped with the necessary staff to  take care of her. Will follow up with family regarding this assessment and follow up with SNF referrals if they desire.  Will follow and assist with support and dc planning as needed.  Employment status:  Disabled (Comment on whether or not currently receiving Disability) Insurance information:  Medicaid In [place] PT Recommendations:  Not assessed at this time Information / Referral to community resources:     Patient/Family's Response to care: Pt accepting of care.  Patient/Family's Understanding of and Emotional Response to Diagnosis, Current Treatment, and Prognosis: It appears pt has basic understanding of diagnosis and treatment recommendations. No emotional distress identified.  Emotional Assessment Appearance:  Appears stated age Attitude/Demeanor/Rapport:  Unable to Assess Affect (typically observed):  Unable to Assess Orientation:  Oriented to Self Alcohol / Substance use:  Not Applicable Psych involvement (Current and /or in the community):  No (Comment)  Discharge Needs  Concerns to be addressed:  Discharge Planning Concerns Readmission within the last 30 days:  No Current discharge risk:  None Barriers to Discharge:  No Barriers Identified   Cesar Alf, LCSW 10/05/2017, 11:12 AM

## 2017-10-05 NOTE — Consult Note (Signed)
Reason for Consult: End-stage renal disease Referring Physician: Dr. Broadus John is an 57 y.o. female.  HPI: She is a patient who has history of hypertension, schizophrenia, diabetes presently was brought to the emergency room because of dizziness and altered mental status.  Presently patient seems to be alert and answering questions.  Denies any difficulty breathing.  At this moment no sure for her last dialysis was.  She seemed to have an access on the right arm which has suture.  Patient denies any nausea or vomiting.  Past Medical History:  Diagnosis Date  . Bipolar 1 disorder ([place])   . Diabetes mellitus without complication ([place])   . Hypertension   . Schizophrenia ([employer])     History reviewed. No pertinent surgical history.  History reviewed. No pertinent family history.  Social History:  has an unknown smoking status. She uses smokeless tobacco. She reports that she does not drink alcohol or use drugs.  Allergies: No Known Allergies  Medications: I have reviewed the patient's current medications.  Results for orders placed or performed during the hospital encounter of 10/04/17 (from the past 48 hour(s))  POC CBG, ED     Status: Abnormal   Collection Time: 10/04/17  5:51 PM  Result Value Ref Range   Glucose-Capillary 296 (H) 65 - 99 mg/dL  CBC with Differential     Status: Abnormal   Collection Time: 10/04/17  6:19 PM  Result Value Ref Range   WBC 5.2 4.0 - 10.5 K/uL   RBC 3.67 (L) 3.87 - 5.11 MIL/uL   Hemoglobin 10.8 (L) 12.0 - 15.0 g/dL   HCT 33.9 (L) 36.0 - 46.0 %   MCV 92.4 78.0 - 100.0 fL   MCH 29.4 26.0 - 34.0 pg   MCHC 31.9 30.0 - 36.0 g/dL   RDW 15.3 11.5 - 15.5 %   Platelets 205 150 - 400 K/uL   Neutrophils Relative % 55 %   Neutro Abs 2.9 1.7 - 7.7 K/uL   Lymphocytes Relative 31 %   Lymphs Abs 1.6 0.7 - 4.0 K/uL   Monocytes Relative 10 %   Monocytes Absolute 0.5 0.1 - 1.0 K/uL   Eosinophils Relative 4 %   Eosinophils Absolute 0.2 0.0 - 0.7 K/uL    Basophils Relative 0 %   Basophils Absolute 0.0 0.0 - 0.1 K/uL    Comment: Performed at [facility], [address], [place], [place] [address]  Basic metabolic panel     Status: Abnormal   Collection Time: 10/04/17  6:19 PM  Result Value Ref Range   Sodium 132 (L) 135 - 145 mmol/L   Potassium 6.1 (H) 3.5 - 5.1 mmol/L   Chloride 96 (L) 101 - 111 mmol/L   CO2 26 22 - 32 mmol/L   Glucose, Bld 328 (H) 65 - 99 mg/dL   BUN 65 (H) 6 - 20 mg/dL   Creatinine, Ser 9.36 (H) 0.44 - 1.00 mg/dL   Calcium 9.6 8.9 - 10.3 mg/dL   GFR calc non Af Amer 4 (L) >60 mL/min   GFR calc Af Amer 5 (L) >60 mL/min    Comment: (NOTE) The eGFR has been calculated using the CKD EPI equation. This calculation has not been validated in all clinical situations. eGFR's persistently <60 mL/min signify possible Chronic Kidney Disease.    Anion gap 10 5 - 15    Comment: Performed at [facility], [address], [place], [place] [address]  hCG, quantitative, pregnancy  Status: None   Collection Time: 10/04/17  6:19 PM  Result Value Ref Range   hCG, Beta Chain, Quant, S 1 <5 mIU/mL    Comment:          GEST. AGE      CONC.  (mIU/mL)   <=1 WEEK        5 - 50     2 WEEKS       50 - 500     3 WEEKS       100 - 10,000     4 WEEKS     1,000 - 30,000     5 WEEKS     3,500 - 115,000   6-8 WEEKS     12,000 - 270,000    12 WEEKS     15,000 - 220,000        FEMALE AND NON-PREGNANT FEMALE:     LESS THAN 5 mIU/mL Performed at [facility], [address] [place], [place] [address]   Albumin     Status: Abnormal   Collection Time: 10/04/17  6:19 PM  Result Value Ref Range   Albumin 3.3 (L) 3.5 - 5.0 g/dL    Comment: Performed at [facility], [address] [place], [place] [address]  Phosphorus     Status: None   Collection Time: 10/04/17  6:19 PM  Result Value Ref Range   Phosphorus 3.6 2.5 - 4.6 mg/dL    Comment: Performed at [facility], [address], [place], [place] [address]  Basic metabolic panel      Status: Abnormal   Collection Time: 10/04/17 11:20 PM  Result Value Ref Range   Sodium 134 (L) 135 - 145 mmol/L   Potassium 5.9 (H) 3.5 - 5.1 mmol/L   Chloride 97 (L) 101 - 111 mmol/L   CO2 26 22 - 32 mmol/L   Glucose, Bld 298 (H) 65 - 99 mg/dL   BUN 65 (H) 6 - 20 mg/dL   Creatinine, Ser 9.36 (H) 0.44 - 1.00 mg/dL   Calcium 10.0 8.9 - 10.3 mg/dL   GFR calc non Af Amer 4 (L) >60 mL/min   GFR calc Af Amer 5 (L) >60 mL/min    Comment: (NOTE) The eGFR has been calculated using the CKD EPI equation. This calculation has not been validated in all clinical situations. eGFR's persistently <60 mL/min signify possible Chronic Kidney Disease.    Anion gap 11 5 - 15    Comment: Performed at [facility], [address], [place], [place] [address]  Valproic acid level     Status: Abnormal   Collection Time: 10/04/17 11:20 PM  Result Value Ref Range   Valproic Acid Lvl 22 (L) 50.0 - 100.0 ug/mL    Comment: Performed at [facility], [address] [place], [place] [address]  MRSA PCR Screening     Status: None   Collection Time: 10/05/17 12:44 AM  Result Value Ref Range   MRSA by PCR NEGATIVE NEGATIVE    Comment:        The GeneXpert MRSA Assay (FDA approved for NASAL specimens only), is one component of a comprehensive MRSA colonization surveillance program. It is not intended to diagnose MRSA infection nor to guide or monitor treatment for MRSA infections. Performed at [facility], [address], [place], [place] [address]   Glucose, capillary     Status: Abnormal   Collection Time: 10/05/17 12:50 AM  Result Value Ref Range   Glucose-Capillary 272 (H) 65 -  99 mg/dL  Comprehensive metabolic panel     Status: Abnormal   Collection Time: 10/05/17  4:28 AM  Result Value Ref Range   Sodium 138 135 - 145 mmol/L   Potassium 5.7 (H) 3.5 - 5.1 mmol/L   Chloride 103 101 - 111 mmol/L   CO2 24 22 - 32 mmol/L   Glucose, Bld 228 (H) 65 - 99 mg/dL   BUN 69 (H) 6 - 20 mg/dL   Creatinine,  Ser 9.39 (H) 0.44 - 1.00 mg/dL   Calcium 9.8 8.9 - 10.3 mg/dL   Total Protein 7.6 6.5 - 8.1 g/dL   Albumin 3.3 (L) 3.5 - 5.0 g/dL   AST 16 15 - 41 U/L   ALT 14 14 - 54 U/L   Alkaline Phosphatase 96 38 - 126 U/L   Total Bilirubin 0.5 0.3 - 1.2 mg/dL   GFR calc non Af Amer 4 (L) >60 mL/min   GFR calc Af Amer 5 (L) >60 mL/min    Comment: (NOTE) The eGFR has been calculated using the CKD EPI equation. This calculation has not been validated in all clinical situations. eGFR's persistently <60 mL/min signify possible Chronic Kidney Disease.    Anion gap 11 5 - 15    Comment: Performed at [facility], [address], [place], [place] [address]  CBC     Status: Abnormal   Collection Time: 10/05/17  4:28 AM  Result Value Ref Range   WBC 5.6 4.0 - 10.5 K/uL   RBC 3.87 3.87 - 5.11 MIL/uL   Hemoglobin 11.6 (L) 12.0 - 15.0 g/dL   HCT 35.6 (L) 36.0 - 46.0 %   MCV 92.0 78.0 - 100.0 fL   MCH 30.0 26.0 - 34.0 pg   MCHC 32.6 30.0 - 36.0 g/dL   RDW 15.4 11.5 - 15.5 %   Platelets 166 150 - 400 K/uL    Comment: Performed at [facility], [address], [place], [place] [address]  Ammonia     Status: None   Collection Time: 10/05/17  4:28 AM  Result Value Ref Range   Ammonia 30 9 - 35 umol/L    Comment: Performed at [facility], [address], [place], [place] [address]  Glucose, capillary     Status: Abnormal   Collection Time: 10/05/17  7:13 AM  Result Value Ref Range   Glucose-Capillary 256 (H) 65 - 99 mg/dL    Dg Chest 1 View  Result Date: 10/04/2017 CLINICAL DATA:  Patient fell today. Right leg pain. Sleeping more than usual. EXAM: CHEST  1 VIEW COMPARISON:  10/21/2016 FINDINGS: Right central venous catheter with tip over the right atrium. Additional catheter fragment demonstrated in the right axillary region. Shallow inspiration with elevation of the right hemidiaphragm. Cardiac enlargement with mild vascular congestion. No edema or consolidation. Linear atelectasis in the lung bases. No  blunting of costophrenic angles. No pneumothorax. Degenerative changes in the shoulders. IMPRESSION: Shallow inspiration with linear atelectasis in the lung bases. Cardiac enlargement with mild vascular congestion. No edema or consolidation. Electronically Signed   By: Lucienne Capers M.D.   On: 10/04/2017 21:05   Dg Pelvis 1-2 Views  Result Date: 10/04/2017 CLINICAL DATA:  Patient fell today. Right leg pain. Sleeping more than usual. EXAM: PELVIS - 1-2 VIEW COMPARISON:  None. FINDINGS: Mild degenerative changes in the hips. There is no evidence of pelvic fracture or diastasis. No pelvic bone lesions are seen. Vascular calcifications. Calcified phleboliths in the pelvis. IMPRESSION: No acute bony abnormalities  identified. Electronically Signed   By: Lucienne Capers M.D.   On: 10/04/2017 21:04   Ct Head Wo Contrast  Result Date: 10/04/2017 CLINICAL DATA:  Altered mental status. EXAM: CT HEAD WITHOUT CONTRAST CT CERVICAL SPINE WITHOUT CONTRAST TECHNIQUE: Multidetector CT imaging of the head and cervical spine was performed following the standard protocol without intravenous contrast. Multiplanar CT image reconstructions of the cervical spine were also generated. COMPARISON:  Head CT dated 10/21/2016. FINDINGS: CT HEAD FINDINGS Brain: Again noted is generalized parenchymal atrophy with commensurate dilatation of the ventricles and sulci. Ventricles are stable in size. There is no mass, hemorrhage, edema or other evidence of acute parenchymal abnormality. No extra-axial hemorrhage. Vascular: There are chronic calcified atherosclerotic changes of the large vessels at the skull base. No unexpected hyperdense vessel. Skull: Normal. Negative for fracture or focal lesion. Sinuses/Orbits: No acute finding. Other: None. CT CERVICAL SPINE FINDINGS Alignment: Mild scoliosis at the cervicothoracic junction. No evidence of acute vertebral body subluxation. Skull base and vertebrae: No fracture line or displaced fracture  fragment seen. No acute or suspicious osseous lesion. Soft tissues and spinal canal: No prevertebral fluid or swelling. No visible canal hematoma. Disc levels: Mild disc desiccation at C5-6. No significant central canal stenosis at any level. Upper chest: No acute or significant findings. Other: Bilateral carotid atherosclerosis. IMPRESSION: 1. No acute intracranial abnormality. No intracranial mass, hemorrhage or edema. 2. No acute or significant findings within the cervical spine. Mild degenerative change at the C5-6 level. 3. Carotid atherosclerosis. Electronically Signed   By: Franki Cabot M.D.   On: 10/04/2017 20:57   Ct Cervical Spine Wo Contrast  Result Date: 10/04/2017 CLINICAL DATA:  Altered mental status. EXAM: CT HEAD WITHOUT CONTRAST CT CERVICAL SPINE WITHOUT CONTRAST TECHNIQUE: Multidetector CT imaging of the head and cervical spine was performed following the standard protocol without intravenous contrast. Multiplanar CT image reconstructions of the cervical spine were also generated. COMPARISON:  Head CT dated 10/21/2016. FINDINGS: CT HEAD FINDINGS Brain: Again noted is generalized parenchymal atrophy with commensurate dilatation of the ventricles and sulci. Ventricles are stable in size. There is no mass, hemorrhage, edema or other evidence of acute parenchymal abnormality. No extra-axial hemorrhage. Vascular: There are chronic calcified atherosclerotic changes of the large vessels at the skull base. No unexpected hyperdense vessel. Skull: Normal. Negative for fracture or focal lesion. Sinuses/Orbits: No acute finding. Other: None. CT CERVICAL SPINE FINDINGS Alignment: Mild scoliosis at the cervicothoracic junction. No evidence of acute vertebral body subluxation. Skull base and vertebrae: No fracture line or displaced fracture fragment seen. No acute or suspicious osseous lesion. Soft tissues and spinal canal: No prevertebral fluid or swelling. No visible canal hematoma. Disc levels: Mild disc  desiccation at C5-6. No significant central canal stenosis at any level. Upper chest: No acute or significant findings. Other: Bilateral carotid atherosclerosis. IMPRESSION: 1. No acute intracranial abnormality. No intracranial mass, hemorrhage or edema. 2. No acute or significant findings within the cervical spine. Mild degenerative change at the C5-6 level. 3. Carotid atherosclerosis. Electronically Signed   By: Franki Cabot M.D.   On: 10/04/2017 20:57   Dg Femur Portable Min 2 Views Right  Result Date: 10/04/2017 CLINICAL DATA:  Per ED notes: [facility] reports that pt fell today, pt from [facility] and that pt had some right leg pain, pt denies pain at present. Reported that has been sleeping more than usual. EXAM: RIGHT FEMUR PORTABLE 2 VIEW COMPARISON:  None. FINDINGS: No fracture RIGHT femur. No dislocation. Atherosclerotic  calcification noted. IMPRESSION: No fracture or dislocation. Electronically Signed   By: Suzy Bouchard M.D.   On: 10/04/2017 21:02    Review of Systems  Constitutional: Negative for chills and fever.  Respiratory: Negative for shortness of breath.   Cardiovascular: Negative for orthopnea.  Gastrointestinal: Negative for abdominal pain, nausea and vomiting.   Blood pressure 138/83, pulse 65, temperature (!) 97.3 F (36.3 C), temperature source Axillary, resp. rate 14, height 5' (1.524 m), weight 67.1 kg (147 lb 14.9 oz), SpO2 100 %. Physical Exam  Constitutional: No distress.  Neck: No JVD present.  Cardiovascular: Normal rate and regular rhythm.  Respiratory: No respiratory distress. She has no wheezes.  GI: She exhibits no distension.  Musculoskeletal: She exhibits no edema.  She has right upper arm fistula which has some suture.  Presently does not have any bruit or thrill.  She does not have any other access.  Neurological: She is alert.    Assessment/Plan: 1] hyperkalemia: Her potassium is high.  Possibly from high potassium intake and also lack of  dialysis. 2] chronic renal failure/end-stage.  Patient at this moment denies any nausea or vomiting. 3] hypertension: Her blood pressure is reasonably controlled 4] schizophrenia 5] hypertension: Her blood pressure is reasonably controlled 6] diabetes 7] anemia: Her hemoglobin is below target goal. Plan: We will ask surgery to put a temporary dialysis access. 2] we will dialyze patient today and possibly remove about 3 L. 3] we will send patient for reevaluation of her dialysis access once patient becomes more atable 4] we will check a renal panel and CBC in the morning  Coulter Oldaker S 10/05/2017, 8:13 AM

## 2017-10-05 NOTE — Progress Notes (Signed)
BP 210/98, Dr Olevia Bowens notified. No new orders at this time. Will continue to monitor

## 2017-10-05 NOTE — Procedures (Signed)
Procedure Note  10/05/17    Preoperative Diagnosis: End stage renal disease, thrombosed AV fistula    Postoperative Diagnosis: Same   Procedure(s) Performed: Dialysis central Line placement, femoral line on left    Surgeon: Lanell Matar. Constance Haw, MD   Assistants: None   Anesthesia: 1% lidocaine    Complications: None    Indications: [name] is a 57 y.o. with ESRD who has a thrombosed AV fistula and needs dialysis. She lives in a group home, and the group home gave consent of to the procedure.  Procedure: The patient placed supine. The left groin was prepped and draped in the usual sterile fashion.  Wearing full gown and gloves, I performed the procedure.  One percent lidocaine was used for local anesthesia. An ultrasound was utilized to assess the femoral vein.  The needle with syringe was advanced into the subclavian vein with dark venous return, and a wire was placed using the Seldinger technique without difficulty.  The skin was knicked and a dilator was placed, and the dialysis catheter was placed over the wire with continued control of the wire.  There was good draw back of blood from the two lumens and each flushed easily with saline.  The catheter was secured with 2-0 silk and a biopatch and dressing was placed.     The access can be used for dialysis. It can be removed once patient has more permanent access.   Curlene Labrum, MD [facility] [address] [place], [place] [address] [phone] (office)

## 2017-10-06 ENCOUNTER — Inpatient Hospital Stay (HOSPITAL_COMMUNITY): Payer: Medicaid Other

## 2017-10-06 LAB — CBC
HEMATOCRIT: 40.9 % (ref 36.0–46.0)
HEMOGLOBIN: 13.1 g/dL (ref 12.0–15.0)
MCH: 29.9 pg (ref 26.0–34.0)
MCHC: 32 g/dL (ref 30.0–36.0)
MCV: 93.4 fL (ref 78.0–100.0)
Platelets: 146 10*3/uL — ABNORMAL LOW (ref 150–400)
RBC: 4.38 MIL/uL (ref 3.87–5.11)
RDW: 15.6 % — ABNORMAL HIGH (ref 11.5–15.5)
WBC: 9.6 10*3/uL (ref 4.0–10.5)

## 2017-10-06 LAB — RENAL FUNCTION PANEL
ANION GAP: 11 (ref 5–15)
Albumin: 3.5 g/dL (ref 3.5–5.0)
BUN: 52 mg/dL — ABNORMAL HIGH (ref 6–20)
CHLORIDE: 103 mmol/L (ref 101–111)
CO2: 23 mmol/L (ref 22–32)
Calcium: 9.7 mg/dL (ref 8.9–10.3)
Creatinine, Ser: 7.85 mg/dL — ABNORMAL HIGH (ref 0.44–1.00)
GFR calc non Af Amer: 5 mL/min — ABNORMAL LOW (ref >60)
GFR, EST AFRICAN AMERICAN: 6 mL/min — AB (ref >60)
GLUCOSE: 198 mg/dL — AB (ref 65–99)
POTASSIUM: 6.5 mmol/L — AB (ref 3.5–5.1)
Phosphorus: 3.1 mg/dL (ref 2.5–4.6)
SODIUM: 137 mmol/L (ref 135–145)

## 2017-10-06 LAB — GLUCOSE, CAPILLARY
GLUCOSE-CAPILLARY: 132 mg/dL — AB (ref 65–99)
Glucose-Capillary: 174 mg/dL — ABNORMAL HIGH (ref 65–99)
Glucose-Capillary: 91 mg/dL (ref 65–99)
Glucose-Capillary: 127 mg/dL — ABNORMAL HIGH (ref 65–99)

## 2017-10-06 MED ORDER — HEPARIN SODIUM (PORCINE) 1000 UNIT/ML IJ SOLN
INTRAMUSCULAR | Status: AC
  Administered 2017-10-06: 1000 [IU] via INTRAVENOUS_CENTRAL
  Filled 2017-10-06: qty 1

## 2017-10-06 MED ORDER — STERILE WATER FOR INJECTION IJ SOLN
INTRAMUSCULAR | Status: AC
  Administered 2017-10-06: 4.4 mL
  Filled 2017-10-06: qty 10

## 2017-10-06 MED ORDER — SODIUM BICARBONATE 8.4 % IV SOLN
50.0000 meq | Freq: Once | INTRAVENOUS | Status: AC
  Administered 2017-10-06: 50 meq via INTRAVENOUS
  Filled 2017-10-06: qty 50

## 2017-10-06 MED ORDER — ALBUTEROL SULFATE (2.5 MG/3ML) 0.083% IN NEBU
1.2500 mg | INHALATION_SOLUTION | Freq: Once | RESPIRATORY_TRACT | Status: AC
  Administered 2017-10-06: 1.25 mg via RESPIRATORY_TRACT

## 2017-10-06 MED ORDER — OXYCODONE-ACETAMINOPHEN 5-325 MG PO TABS
1.0000 | ORAL_TABLET | Freq: Once | ORAL | Status: AC
  Administered 2017-10-06: 1 via ORAL
  Filled 2017-10-06: qty 1

## 2017-10-06 MED ORDER — ALBUTEROL SULFATE (2.5 MG/3ML) 0.083% IN NEBU
INHALATION_SOLUTION | RESPIRATORY_TRACT | Status: AC
  Filled 2017-10-06: qty 3

## 2017-10-06 MED ORDER — SODIUM POLYSTYRENE SULFONATE 15 GM/60ML PO SUSP
30.0000 g | Freq: Once | ORAL | Status: AC
  Administered 2017-10-06: 30 g via ORAL
  Filled 2017-10-06: qty 120

## 2017-10-06 MED ORDER — ALTEPLASE 2 MG IJ SOLR
INTRAMUSCULAR | Status: AC
  Administered 2017-10-06: 4 mg
  Filled 2017-10-06: qty 4

## 2017-10-06 MED ORDER — DEXTROSE 50 % IV SOLN
25.0000 g | Freq: Once | INTRAVENOUS | Status: AC
  Administered 2017-10-06: 25 g via INTRAVENOUS
  Filled 2017-10-06: qty 50

## 2017-10-06 MED ORDER — CALCIUM GLUCONATE 10 % IV SOLN
1.0000 g | Freq: Once | INTRAVENOUS | Status: AC
  Administered 2017-10-06: 1 g via INTRAVENOUS
  Filled 2017-10-06: qty 10

## 2017-10-06 MED ORDER — SODIUM CHLORIDE 0.9 % IV SOLN: 100.0000 mL | INTRAVENOUS | Status: DC | PRN

## 2017-10-06 MED ORDER — ALTEPLASE 2 MG IJ SOLR
2.0000 mg | Freq: Once | INTRAMUSCULAR | Status: AC | PRN
  Administered 2017-10-06: 4 mg
  Filled 2017-10-06: qty 2

## 2017-10-06 MED ORDER — INSULIN ASPART 100 UNIT/ML IV SOLN
10.0000 [IU] | Freq: Once | INTRAVENOUS | Status: AC
  Administered 2017-10-06: 10 [IU] via INTRAVENOUS

## 2017-10-06 MED ORDER — SODIUM CHLORIDE 0.9 % IV SOLN
INTRAVENOUS | Status: AC
  Administered 2017-10-06: 08:00:00
  Filled 2017-10-06: qty 10

## 2017-10-06 MED ORDER — SODIUM BICARBONATE 4.2 % IV SOLN
50.0000 meq | Freq: Once | INTRAVENOUS | Status: DC
  Administered 2017-10-06: 50 meq via INTRAVENOUS
  Filled 2017-10-06: qty 100

## 2017-10-06 NOTE — Progress Notes (Signed)
Subjective: Interval History: none.  Objective: Vital signs in last 24 hours: Temp:  [95.8 F (35.4 C)-98.2 F (36.8 C)] 97.9 F (36.6 C) (06/18 0724) Pulse Rate:  [50-86] 77 (06/18 0724) Resp:  [8-21] 21 (06/18 0724) BP: (69-169)/(52-102) 130/79 (06/18 0500) SpO2:  [97 %-100 %] 99 % (06/18 0724) Weight:  [65 kg (143 lb 4.8 oz)-67.3 kg (148 lb 5.9 oz)] 65 kg (143 lb 4.8 oz) (06/18 0500) Weight change: 0.2 kg (7.1 oz)  Intake/Output from previous day: 06/17 0701 - 06/18 0700 In: -  Out: 1854  Intake/Output this shift: No intake/output data recorded.   generally patient remains confused.  Presently she does not answer questions. Chest is clear to auscultation Heart exam revealed regular rate and rhythm no murmur no S3 Extremities no edema  Lab Results: Recent Labs    10/05/17 0428 10/06/17 0422  WBC 5.6 9.6  HGB 11.6* 13.1  HCT 35.6* 40.9  PLT 166 146*   BMET:  Recent Labs    10/05/17 0428 10/06/17 0422  NA 138 137  K 5.7* 6.5*  CL 103 103  CO2 24 23  GLUCOSE 228* 198*  BUN 69* 52*  CREATININE 9.39* 7.85*  CALCIUM 9.8 9.7   No results for input(s): PTH in the last 72 hours. Iron Studies: No results for input(s): IRON, TIBC, TRANSFERRIN, FERRITIN in the last 72 hours.  Studies/Results: Dg Chest 1 View  Result Date: 10/04/2017 CLINICAL DATA:  Patient fell today. Right leg pain. Sleeping more than usual. EXAM: CHEST  1 VIEW COMPARISON:  10/21/2016 FINDINGS: Right central venous catheter with tip over the right atrium. Additional catheter fragment demonstrated in the right axillary region. Shallow inspiration with elevation of the right hemidiaphragm. Cardiac enlargement with mild vascular congestion. No edema or consolidation. Linear atelectasis in the lung bases. No blunting of costophrenic angles. No pneumothorax. Degenerative changes in the shoulders. IMPRESSION: Shallow inspiration with linear atelectasis in the lung bases. Cardiac enlargement with mild  vascular congestion. No edema or consolidation. Electronically Signed   By: Lucienne Capers M.D.   On: 10/04/2017 21:05   Dg Pelvis 1-2 Views  Result Date: 10/04/2017 CLINICAL DATA:  Patient fell today. Right leg pain. Sleeping more than usual. EXAM: PELVIS - 1-2 VIEW COMPARISON:  None. FINDINGS: Mild degenerative changes in the hips. There is no evidence of pelvic fracture or diastasis. No pelvic bone lesions are seen. Vascular calcifications. Calcified phleboliths in the pelvis. IMPRESSION: No acute bony abnormalities identified. Electronically Signed   By: Lucienne Capers M.D.   On: 10/04/2017 21:04   Ct Head Wo Contrast  Result Date: 10/04/2017 CLINICAL DATA:  Altered mental status. EXAM: CT HEAD WITHOUT CONTRAST CT CERVICAL SPINE WITHOUT CONTRAST TECHNIQUE: Multidetector CT imaging of the head and cervical spine was performed following the standard protocol without intravenous contrast. Multiplanar CT image reconstructions of the cervical spine were also generated. COMPARISON:  Head CT dated 10/21/2016. FINDINGS: CT HEAD FINDINGS Brain: Again noted is generalized parenchymal atrophy with commensurate dilatation of the ventricles and sulci. Ventricles are stable in size. There is no mass, hemorrhage, edema or other evidence of acute parenchymal abnormality. No extra-axial hemorrhage. Vascular: There are chronic calcified atherosclerotic changes of the large vessels at the skull base. No unexpected hyperdense vessel. Skull: Normal. Negative for fracture or focal lesion. Sinuses/Orbits: No acute finding. Other: None. CT CERVICAL SPINE FINDINGS Alignment: Mild scoliosis at the cervicothoracic junction. No evidence of acute vertebral body subluxation. Skull base and vertebrae: No fracture line or displaced  fracture fragment seen. No acute or suspicious osseous lesion. Soft tissues and spinal canal: No prevertebral fluid or swelling. No visible canal hematoma. Disc levels: Mild disc desiccation at C5-6. No  significant central canal stenosis at any level. Upper chest: No acute or significant findings. Other: Bilateral carotid atherosclerosis. IMPRESSION: 1. No acute intracranial abnormality. No intracranial mass, hemorrhage or edema. 2. No acute or significant findings within the cervical spine. Mild degenerative change at the C5-6 level. 3. Carotid atherosclerosis. Electronically Signed   By: Franki Cabot M.D.   On: 10/04/2017 20:57   Ct Cervical Spine Wo Contrast  Result Date: 10/04/2017 CLINICAL DATA:  Altered mental status. EXAM: CT HEAD WITHOUT CONTRAST CT CERVICAL SPINE WITHOUT CONTRAST TECHNIQUE: Multidetector CT imaging of the head and cervical spine was performed following the standard protocol without intravenous contrast. Multiplanar CT image reconstructions of the cervical spine were also generated. COMPARISON:  Head CT dated 10/21/2016. FINDINGS: CT HEAD FINDINGS Brain: Again noted is generalized parenchymal atrophy with commensurate dilatation of the ventricles and sulci. Ventricles are stable in size. There is no mass, hemorrhage, edema or other evidence of acute parenchymal abnormality. No extra-axial hemorrhage. Vascular: There are chronic calcified atherosclerotic changes of the large vessels at the skull base. No unexpected hyperdense vessel. Skull: Normal. Negative for fracture or focal lesion. Sinuses/Orbits: No acute finding. Other: None. CT CERVICAL SPINE FINDINGS Alignment: Mild scoliosis at the cervicothoracic junction. No evidence of acute vertebral body subluxation. Skull base and vertebrae: No fracture line or displaced fracture fragment seen. No acute or suspicious osseous lesion. Soft tissues and spinal canal: No prevertebral fluid or swelling. No visible canal hematoma. Disc levels: Mild disc desiccation at C5-6. No significant central canal stenosis at any level. Upper chest: No acute or significant findings. Other: Bilateral carotid atherosclerosis. IMPRESSION: 1. No acute  intracranial abnormality. No intracranial mass, hemorrhage or edema. 2. No acute or significant findings within the cervical spine. Mild degenerative change at the C5-6 level. 3. Carotid atherosclerosis. Electronically Signed   By: Franki Cabot M.D.   On: 10/04/2017 20:57   Dg Femur Portable Min 2 Views Right  Result Date: 10/04/2017 CLINICAL DATA:  Per ED notes: [facility] reports that pt fell today, pt from [facility] and that pt had some right leg pain, pt denies pain at present. Reported that has been sleeping more than usual. EXAM: RIGHT FEMUR PORTABLE 2 VIEW COMPARISON:  None. FINDINGS: No fracture RIGHT femur. No dislocation. Atherosclerotic calcification noted. IMPRESSION: No fracture or dislocation. Electronically Signed   By: Suzy Bouchard M.D.   On: 10/04/2017 21:02    I have reviewed the patient's current medications.  Assessment/Plan: 1] altered mental status: Presently does not show any significant change. 2] end-stage renal disease: She is status post hemodialysis yesterday.  However her potassium remains high this morning. 3] anemia: Her hemoglobin is above our target goal. 4] bone and mineral disorder: Her calcium is a range 5] history of hypertension: Her blood pressure is reasonably controlled  6] history of diabetes 7] fluid management: Were able to remove about 1800 cc yesterday.  She did not have any sign of fluid overload. Plan: 1] we will dialyze patient today and again for 3 hours. 2] we will use 2K for the first hour and 1K for the next 2 hours. 3] we will check her CBC and renal panel in the morning.   LOS: 1 day   Naavya Postma S 10/06/2017,7:32 AM

## 2017-10-06 NOTE — Progress Notes (Signed)
PROGRESS NOTE    [name]  HGD:[id number] DOB: [birth year] DOA: 10/04/2017 PCP: Patient, No Pcp Per     Brief Narrative:  57 year old woman admitted on 6/16 due to acute confusion.  She has a history of end-stage renal disease on dialysis, schizophrenia, hypertension and diabetes who currently resides at a family care home.  On Friday she was seen at [facility] for AV fistula thrombosis, it appears that she missed dialysis that day.  She is brought into the hospital today due to hypersomnolence where she is found to be hyperkalemic and admission is requested.   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  Mild improvement after dialysis yesterday but still remains confused, unclear as to what her baseline is. -Given no metabolic abnormality, will proceed with MRI of the brain today. -CT head without acute abnormalities.  End-stage renal disease -With hyperkalemia due to missed dialysis sessions.  Hyperkalemia persists despite dialysis on 6/17. -Will receive another dialysis session today. -Did receive calcium gluconate and bicarbonate in the ED, kayexalate enema ordered. -Patient's AV fistula is currently not working. -Dr. Constance Haw has seen and has placed a temporary hemodialysis catheter for emergent dialysis today, will need plans for permanent tunneled dialysis catheter prior to discharge.  Bipolar 1 disorder/schizophrenia -Psychotropics and sedatives have been on hold for now due to acute encephalopathy.  Essential hypertension -Significantly improved after hemodialysis. -Continue PRN  hydralazine.  Diabetes -Well-controlled, continue current management.  Last CBGs documented are 108 and 139.   DVT prophylaxis: Subcutaneous heparin Code Status: Full code Family Communication: Patient only Disposition Plan: Suspect may need SNF instead of family care home.  Social work is aware and following.  Consultants:   Nephrology  General surgery  Procedures:   Temporary hemodialysis catheter placement on 6/17  Hemodialysis on 6/17 and 6/18.  Antimicrobials:  Anti-infectives (From admission, onward)   None       Subjective: Lying in bed, still very drowsy, pushes my hands away and says "leave me alone".  Objective: Vitals:   10/06/17 0400 10/06/17 0500 10/06/17 0724 10/06/17 0924  BP: 138/72 130/79    Pulse: 77 74 77   Resp: 15 15 (!) 21   Temp:  98.2 F (36.8 C) 97.9 F (36.6 C)   TempSrc:  Oral Axillary   SpO2: 100% 100% 99% 98%  Weight:  65 kg (143 lb 4.8 oz)    Height:        Intake/Output Summary (Last 24 hours) at 10/06/2017 1028 Last data filed at 10/06/2017 0125 Gross per 24 hour  Intake -  Output 1854 ml  Net -1854 ml   Filed Weights   10/05/17 0400 10/05/17 2015 10/06/17 0500  Weight: 67.1 kg (147 lb 14.9 oz) 67.3 kg (148 lb 5.9 oz) 65 kg (143 lb 4.8 oz)    Examination:  General exam: Drowsy, not oriented Respiratory system: Clear to auscultation. Respiratory effort normal. Cardiovascular system:RRR. No murmurs, rubs, gallops. Gastrointestinal system: Abdomen is nondistended, soft and nontender. No organomegaly or masses felt. Normal bowel sounds heard. Central nervous system: unable to fully assess given current mental state but moves all 4  spontaneously Extremities: No C/C/E, +pedal pulses Skin: No rashes, lesions or ulcers Psychiatry: Unable to assess given current mental state.     Data Reviewed: I have personally reviewed following labs and imaging studies  CBC: Recent Labs  Lab 10/04/17 1819 10/05/17 0428  10/06/17 0422  WBC 5.2 5.6 9.6  NEUTROABS 2.9  --   --   HGB 10.8* 11.6* 13.1  HCT 33.9* 35.6* 40.9  MCV 92.4 92.0 93.4  PLT 205 166 536*   Basic Metabolic Panel: Recent Labs  Lab 10/04/17 1819 10/04/17 2320 10/05/17 0428 10/06/17 0422  NA 132* 134* 138 137  K 6.1* 5.9* 5.7* 6.5*  CL 96* 97* 103 103  CO2 26 26 24 23   GLUCOSE 328* 298* 228* 198*  BUN 65* 65* 69* 52*  CREATININE 9.36* 9.36* 9.39* 7.85*  CALCIUM 9.6 10.0 9.8 9.7  PHOS 3.6  --   --  3.1   GFR: Estimated Creatinine Clearance: 6.7 mL/min (A) (by C-G formula based on SCr of 7.85 mg/dL (H)). Liver Function Tests: Recent Labs  Lab 10/04/17 1819 10/05/17 0428 10/06/17 0422  AST  --  16  --   ALT  --  14  --   ALKPHOS  --  96  --   BILITOT  --  0.5  --   PROT  --  7.6  --   ALBUMIN 3.3* 3.3* 3.5   No results for input(s): LIPASE, AMYLASE in the last 168 hours. Recent Labs  Lab 10/05/17 0428  AMMONIA 30   Coagulation Profile: Recent Labs  Lab 10/05/17 1009  INR 1.08   Cardiac Enzymes: No results for input(s): CKTOTAL, CKMB, CKMBINDEX, TROPONINI in the last 168 hours. BNP (last 3 results) No results for input(s): PROBNP in the last 8760 hours. HbA1C: No results for input(s): HGBA1C in the last 72 hours. CBG: Recent Labs  Lab 10/05/17 0713 10/05/17 1115 10/05/17 1627 10/05/17 2114 10/06/17 0719  GLUCAP 256* 177* 108* 139* 174*   Lipid Profile: No results for input(s): CHOL, HDL, LDLCALC, TRIG, CHOLHDL, LDLDIRECT in the last 72 hours. Thyroid Function Tests: No results for input(s): TSH, T4TOTAL, FREET4, T3FREE, THYROIDAB in the last 72 hours. Anemia Panel: No results for input(s): VITAMINB12, FOLATE, FERRITIN, TIBC, IRON, RETICCTPCT in the last 72 hours. Urine analysis:    Component Value Date/Time   COLORURINE Yellow 09/17/2012 1205   APPEARANCEUR Clear 09/17/2012 1205   LABSPEC 1.013 09/17/2012 1205   PHURINE 9.0 09/17/2012 1205   GLUCOSEU Negative 09/17/2012 1205   HGBUR  Negative 09/17/2012 1205   BILIRUBINUR Negative 09/17/2012 1205   KETONESUR Trace 09/17/2012 1205   PROTEINUR 30 mg/dL 09/17/2012 1205   NITRITE Negative 09/17/2012 1205   LEUKOCYTESUR Negative 09/17/2012 1205   Sepsis Labs: @LABRCNTIP (procalcitonin:4,lacticidven:4)  ) Recent Results (from the past 240 hour(s))  MRSA PCR Screening     Status: None   Collection Time: 10/05/17 12:44 AM  Result Value Ref Range Status   MRSA by PCR NEGATIVE NEGATIVE Final    Comment:        The GeneXpert MRSA Assay (FDA approved for NASAL specimens only), is one component of a comprehensive MRSA colonization surveillance program. It is not intended to diagnose MRSA infection nor to guide or monitor treatment for MRSA infections. Performed at [facility], [address] [place], [place] [address]          Radiology Studies: Dg Chest 1 View  Result Date: 10/04/2017 CLINICAL DATA:  Patient fell today. Right leg pain. Sleeping  more than usual. EXAM: CHEST  1 VIEW COMPARISON:  10/21/2016 FINDINGS: Right central venous catheter with tip over the right atrium. Additional catheter fragment demonstrated in the right axillary region. Shallow inspiration with elevation of the right hemidiaphragm. Cardiac enlargement with mild vascular congestion. No edema or consolidation. Linear atelectasis in the lung bases. No blunting of costophrenic angles. No pneumothorax. Degenerative changes in the shoulders. IMPRESSION: Shallow inspiration with linear atelectasis in the lung bases. Cardiac enlargement with mild vascular congestion. No edema or consolidation. Electronically Signed   By: Lucienne Capers M.D.   On: 10/04/2017 21:05   Dg Pelvis 1-2 Views  Result Date: 10/04/2017 CLINICAL DATA:  Patient fell today. Right leg pain. Sleeping more than usual. EXAM: PELVIS - 1-2 VIEW COMPARISON:  None. FINDINGS: Mild degenerative changes in the hips. There is no evidence of pelvic fracture or diastasis. No pelvic bone  lesions are seen. Vascular calcifications. Calcified phleboliths in the pelvis. IMPRESSION: No acute bony abnormalities identified. Electronically Signed   By: Lucienne Capers M.D.   On: 10/04/2017 21:04   Ct Head Wo Contrast  Result Date: 10/04/2017 CLINICAL DATA:  Altered mental status. EXAM: CT HEAD WITHOUT CONTRAST CT CERVICAL SPINE WITHOUT CONTRAST TECHNIQUE: Multidetector CT imaging of the head and cervical spine was performed following the standard protocol without intravenous contrast. Multiplanar CT image reconstructions of the cervical spine were also generated. COMPARISON:  Head CT dated 10/21/2016. FINDINGS: CT HEAD FINDINGS Brain: Again noted is generalized parenchymal atrophy with commensurate dilatation of the ventricles and sulci. Ventricles are stable in size. There is no mass, hemorrhage, edema or other evidence of acute parenchymal abnormality. No extra-axial hemorrhage. Vascular: There are chronic calcified atherosclerotic changes of the large vessels at the skull base. No unexpected hyperdense vessel. Skull: Normal. Negative for fracture or focal lesion. Sinuses/Orbits: No acute finding. Other: None. CT CERVICAL SPINE FINDINGS Alignment: Mild scoliosis at the cervicothoracic junction. No evidence of acute vertebral body subluxation. Skull base and vertebrae: No fracture line or displaced fracture fragment seen. No acute or suspicious osseous lesion. Soft tissues and spinal canal: No prevertebral fluid or swelling. No visible canal hematoma. Disc levels: Mild disc desiccation at C5-6. No significant central canal stenosis at any level. Upper chest: No acute or significant findings. Other: Bilateral carotid atherosclerosis. IMPRESSION: 1. No acute intracranial abnormality. No intracranial mass, hemorrhage or edema. 2. No acute or significant findings within the cervical spine. Mild degenerative change at the C5-6 level. 3. Carotid atherosclerosis. Electronically Signed   By: Franki Cabot  M.D.   On: 10/04/2017 20:57   Ct Cervical Spine Wo Contrast  Result Date: 10/04/2017 CLINICAL DATA:  Altered mental status. EXAM: CT HEAD WITHOUT CONTRAST CT CERVICAL SPINE WITHOUT CONTRAST TECHNIQUE: Multidetector CT imaging of the head and cervical spine was performed following the standard protocol without intravenous contrast. Multiplanar CT image reconstructions of the cervical spine were also generated. COMPARISON:  Head CT dated 10/21/2016. FINDINGS: CT HEAD FINDINGS Brain: Again noted is generalized parenchymal atrophy with commensurate dilatation of the ventricles and sulci. Ventricles are stable in size. There is no mass, hemorrhage, edema or other evidence of acute parenchymal abnormality. No extra-axial hemorrhage. Vascular: There are chronic calcified atherosclerotic changes of the large vessels at the skull base. No unexpected hyperdense vessel. Skull: Normal. Negative for fracture or focal lesion. Sinuses/Orbits: No acute finding. Other: None. CT CERVICAL SPINE FINDINGS Alignment: Mild scoliosis at the cervicothoracic junction. No evidence of acute vertebral body subluxation. Skull base and vertebrae: No fracture  line or displaced fracture fragment seen. No acute or suspicious osseous lesion. Soft tissues and spinal canal: No prevertebral fluid or swelling. No visible canal hematoma. Disc levels: Mild disc desiccation at C5-6. No significant central canal stenosis at any level. Upper chest: No acute or significant findings. Other: Bilateral carotid atherosclerosis. IMPRESSION: 1. No acute intracranial abnormality. No intracranial mass, hemorrhage or edema. 2. No acute or significant findings within the cervical spine. Mild degenerative change at the C5-6 level. 3. Carotid atherosclerosis. Electronically Signed   By: Franki Cabot M.D.   On: 10/04/2017 20:57   Dg Femur Portable Min 2 Views Right  Result Date: 10/04/2017 CLINICAL DATA:  Per ED notes: [facility] reports that pt fell today, pt from  [facility] and that pt had some right leg pain, pt denies pain at present. Reported that has been sleeping more than usual. EXAM: RIGHT FEMUR PORTABLE 2 VIEW COMPARISON:  None. FINDINGS: No fracture RIGHT femur. No dislocation. Atherosclerotic calcification noted. IMPRESSION: No fracture or dislocation. Electronically Signed   By: Suzy Bouchard M.D.   On: 10/04/2017 21:02        Scheduled Meds: . albuterol      . Chlorhexidine Gluconate Cloth  6 each Topical Q0600  . heparin  5,000 Units Subcutaneous Q8H  . insulin aspart  0-9 Units Subcutaneous TID WC  . insulin glargine  5 Units Subcutaneous QHS  . latanoprost  1 drop Both Eyes QHS  . sodium bicarbonate  50 mEq Intravenous Once   Continuous Infusions: . sodium chloride    . sodium chloride       LOS: 1 day    Time spent: 35 minutes. Greater than 50% of this time was spent in direct contact with the patient, coordinating care.  Discussed with social work the fact that believe she may need SNF, we need to have plans for potentially a legal guardian as patient's family members have been unreachable and she is not capable of making decisions in regards to her own care, also discussed with nursing staff plan for MRI to rule out any CNS etiology for her confusion.       Lelon Frohlich, MD [facility] Pager [phone]  If 7PM-7AM, please contact night-coverage [email or web address] Password [facility] 10/06/2017, 10:28 AM

## 2017-10-06 NOTE — Procedures (Signed)
    HEMODIALYSIS TREATMENT NOTE:   4 hour heparin-free dialysis completed via left femoral temporary catheter. Exit site unremarkable.  Suspect catheter kink.  Pt was noted to be lying on left side in fetal position pre-dialysis.  Arterial catheter port would not aspirate.  Treatment initiated with lines reversed.  Treatment was paused after 30 minutes for instillation of Activase.  Despite cathflo, Max Qb 165.  Unable to tolerate removal of 3L due to hypotension.  Ultrafiltration was interrupted x 30 minutes and UF rate was serially decreased.  Net UF 2L.  All blood was returned. Report given to Deanne Coffer, RN.    Rockwell Alexandria, RN, CDN

## 2017-10-06 NOTE — Procedures (Signed)
     HEMODIALYSIS TREATMENT NOTE:   3 hour heparin-free dialysis completed via left femoral temporary catheter. Goal NOT met: Unable to tolerate ultrafiltration even at minimal rate.  Net UF 300cc.  Activase instilled in both cath ports pre-HD x 60 min.  Average Qb 300cc/min.  All blood was returned.   Rockwell Alexandria, RN, CDN

## 2017-10-06 NOTE — Clinical Social Work Note (Signed)
LCSW following. Spoke with pt's brother, Denyse Amass, today about need for SNF at dc. Discussed options with pt's payer source and Denyse Amass requests referral to [facility].   Will start referral. Pt is referred for a level II screen with [facility].   Also spoke with pt's caregiver, Caryl Comes, at the [facility] [phone] to update. Per [place], she does not feel pt needs NH and that they can continue caring for her at the family care home. She is going to call Mr. Humphrey to discuss.  Will continue to follow and assist with dc planning.

## 2017-10-06 NOTE — Progress Notes (Signed)
K of 6.5 this AM. Called by lab. Reported to MD at [phone]

## 2017-10-07 DIAGNOSIS — G9341 Metabolic encephalopathy: Secondary | ICD-10-CM

## 2017-10-07 LAB — GLUCOSE, CAPILLARY
GLUCOSE-CAPILLARY: 151 mg/dL — AB (ref 65–99)
GLUCOSE-CAPILLARY: 80 mg/dL (ref 65–99)
Glucose-Capillary: 145 mg/dL — ABNORMAL HIGH (ref 65–99)
Glucose-Capillary: 144 mg/dL — ABNORMAL HIGH (ref 65–99)

## 2017-10-07 LAB — RENAL FUNCTION PANEL
ALBUMIN: 3 g/dL — AB (ref 3.5–5.0)
ANION GAP: 11 (ref 5–15)
BUN: 45 mg/dL — AB (ref 6–20)
CALCIUM: 9.3 mg/dL (ref 8.9–10.3)
CO2: 27 mmol/L (ref 22–32)
Chloride: 100 mmol/L — ABNORMAL LOW (ref 101–111)
Creatinine, Ser: 8.26 mg/dL — ABNORMAL HIGH (ref 0.44–1.00)
GFR calc Af Amer: 6 mL/min — ABNORMAL LOW (ref >60)
GFR calc non Af Amer: 5 mL/min — ABNORMAL LOW (ref >60)
GLUCOSE: 121 mg/dL — AB (ref 65–99)
POTASSIUM: 4.4 mmol/L (ref 3.5–5.1)
Phosphorus: 5.2 mg/dL — ABNORMAL HIGH (ref 2.5–4.6)
SODIUM: 138 mmol/L (ref 135–145)

## 2017-10-07 LAB — VITAMIN B12: Vitamin B-12: 985 pg/mL — ABNORMAL HIGH (ref 180–914)

## 2017-10-07 LAB — HEPATITIS B SURFACE ANTIGEN: Hepatitis B Surface Ag: NEGATIVE

## 2017-10-07 MED ORDER — FLUPHENAZINE HCL 5 MG PO TABS
5.0000 mg | ORAL_TABLET | Freq: Every day | ORAL | Status: DC
  Administered 2017-10-08 – 2017-10-14 (×7): 5 mg via ORAL
  Filled 2017-10-07 (×10): qty 1

## 2017-10-07 MED ORDER — ASPIRIN EC 81 MG PO TBEC
81.0000 mg | DELAYED_RELEASE_TABLET | Freq: Every day | ORAL | Status: DC
  Administered 2017-10-07 – 2017-10-14 (×8): 81 mg via ORAL
  Filled 2017-10-07 (×8): qty 1

## 2017-10-07 MED ORDER — BENZTROPINE MESYLATE 1 MG PO TABS
1.0000 mg | ORAL_TABLET | Freq: Every day | ORAL | Status: DC
  Administered 2017-10-07 – 2017-10-14 (×8): 1 mg via ORAL
  Filled 2017-10-07 (×8): qty 1

## 2017-10-07 MED ORDER — SIMVASTATIN 20 MG PO TABS
40.0000 mg | ORAL_TABLET | Freq: Every day | ORAL | Status: DC
Start: 2017-10-07 — End: 2017-10-14
  Administered 2017-10-07 – 2017-10-14 (×8): 40 mg via ORAL
  Filled 2017-10-07 (×8): qty 2

## 2017-10-07 MED ORDER — ADULT MULTIVITAMIN W/MINERALS CH
1.0000 | ORAL_TABLET | Freq: Every day | ORAL | Status: DC
  Administered 2017-10-07 – 2017-10-14 (×8): 1 via ORAL
  Filled 2017-10-07 (×8): qty 1

## 2017-10-07 MED ORDER — DIVALPROEX SODIUM 250 MG PO DR TAB
500.0000 mg | DELAYED_RELEASE_TABLET | Freq: Every day | ORAL | Status: DC
  Administered 2017-10-07 – 2017-10-13 (×7): 500 mg via ORAL
  Filled 2017-10-07 (×7): qty 2

## 2017-10-07 MED ORDER — BUPROPION HCL ER (XL) 300 MG PO TB24
300.0000 mg | ORAL_TABLET | Freq: Every day | ORAL | Status: DC
Start: 2017-10-07 — End: 2017-10-14
  Administered 2017-10-07 – 2017-10-14 (×8): 300 mg via ORAL
  Filled 2017-10-07 (×5): qty 1
  Filled 2017-10-07: qty 2
  Filled 2017-10-07 (×2): qty 1

## 2017-10-07 NOTE — Progress Notes (Signed)
PROGRESS NOTE                                                                                                                                                                                                             Patient Demographics:    [name], is a 57 y.o. female, DOB - Sep 01, 1960, PJA:[id number]  Admit date - 10/04/2017   Admitting Physician Reubin Milan, MD  Outpatient Primary MD for the patient is Patient, No Pcp Per  LOS - 2  Outpatient Specialists: Nephrology  Chief Complaint  Patient presents with  . 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  Admitted for further management.    Subjective:   Patient's mental status appears to have improved since her admission.  She is oriented to place and person.  She is aware about being on dialysis with the schedule and knows her nephrologist.  Denies any symptoms.   Assessment  & Plan :    Principal Problem: Acute metabolic encephalopathy Suspect this is uremic with combination of her underlying bipolar disorder and schizophrenia.  As per nursing staff her mental status is quite improved today.  She is also on multiple medications including Cogentin, Wellbutrin, Depakote, Prolixin, Ativan and Lyrica.  I will resume all of them except for Ativan and Lyrica. No signs of infection.  B12 was normal.  Check TSH.  MRI of the brain shows generalized atrophy. I will resume her home psych meds.  Active Problems: Bipolar disorder/schizophrenia Resident of group home.  Not sure what her baseline mental status is.  I will resume her home meds which were held due to encephalopathy.  (Cogentin, Wellbutrin, Depakote and monthly inVega) hold Ativan for  now  ESRD (end stage renal disease) ([place]) Thrombosed AV graft.  She now has a right femoral catheter for temporary dialysis.  IR plan on tunneled IJ catheter tomorrow at [facility] (10 AM) she can then get dialysis on Friday and discharged to SNF. Renal following  Diabetes mellitus type 2 CBG stable.  Continue Lantus with sliding scale coverage.     Hyperkalemia Resolved with dialysis.  Anemia in ESRD (end-stage renal disease) ([place]) Stable.   Essential hypertension Stable.  Continue home meds      Code Status : full code  Family Communication  : none at bedside  Disposition Plan  : Stable for transfer to medical floor.  SNF possibly on 6/21 after tunneled HD catheter and HD Barriers For Discharge : encephalopathy and need for HD catheter  Consults  : Renal, surgery, IR  Procedures  : Right femoral catheter, MRI brain  DVT Prophylaxis  : Subcu heparin  Lab Results  Component Value Date   PLT 146 (L) 10/06/2017    Antibiotics  :    Anti-infectives (From admission, onward)   None        Objective:   Vitals:   10/07/17 0500 10/07/17 0600 10/07/17 0800 10/07/17 1200  BP: 110/63 (!) 147/77 114/83   Pulse: 71 72    Resp: (!) 9 12 (!) 8   Temp: 98 F (36.7 C)  98.3 F (36.8 C) 98.1 F (36.7 C)  TempSrc: Oral  Oral Oral  SpO2: 97% 100%    Weight:      Height:        Wt Readings from Last 3 Encounters:  10/06/17 64.7 kg (142 lb 10.2 oz)  10/24/16 71.5 kg (157 lb 10.1 oz)     Intake/Output Summary (Last 24 hours) at 10/07/2017 1453 Last data filed at 10/06/2017 1830 Gross per 24 hour  Intake -  Output 331 ml  Net -331 ml     Physical Exam  Gen: not in distress HEENT: , moist mucosa, supple neck Chest: clear b/l, no added sounds CVS: N S1&S2, no murmurs,  GI: soft, NT, ND, BS+ Musculoskeletal: warm, no edema, right femoral catheter, thrombosed right AV fistula CNS: AAOX1-2    Data Review:    CBC Recent Labs  Lab 10/04/17 1819  10/05/17 0428 10/06/17 0422  WBC 5.2 5.6 9.6  HGB 10.8* 11.6* 13.1  HCT 33.9* 35.6* 40.9  PLT 205 166 146*  MCV 92.4 92.0 93.4  MCH 29.4 30.0 29.9  MCHC 31.9 32.6 32.0  RDW 15.3 15.4 15.6*  LYMPHSABS 1.6  --   --   MONOABS 0.5  --   --   EOSABS 0.2  --   --   BASOSABS 0.0  --   --     Chemistries  Recent Labs  Lab 10/04/17 1819 10/04/17 2320 10/05/17 0428 10/06/17 0422 10/07/17 0555  NA 132* 134* 138 137 138  K 6.1* 5.9* 5.7* 6.5* 4.4  CL 96* 97* 103 103 100*  CO2 26 26 24 23 27   GLUCOSE 328* 298* 228* 198* 121*  BUN 65* 65* 69* 52* 45*  CREATININE 9.36* 9.36* 9.39* 7.85* 8.26*  CALCIUM 9.6 10.0 9.8 9.7 9.3  AST  --   --  16  --   --   ALT  --   --  14  --   --   ALKPHOS  --   --  96  --   --   BILITOT  --   --  0.5  --   --    ------------------------------------------------------------------------------------------------------------------ No results for input(s): CHOL, HDL, LDLCALC, TRIG, CHOLHDL, LDLDIRECT in the last 72 hours.  Lab Results  Component Value Date   HGBA1C 6.5 (H) 10/21/2016   ------------------------------------------------------------------------------------------------------------------ No results for input(s): TSH, T4TOTAL, T3FREE, THYROIDAB in the last 72 hours.  Invalid input(s): FREET3 ------------------------------------------------------------------------------------------------------------------ Recent Labs    10/07/17 0759  VITAMINB12 985*  Coagulation profile Recent Labs  Lab 10/05/17 1009  INR 1.08    No results for input(s): DDIMER in the last 72 hours.  Cardiac Enzymes No results for input(s): CKMB, TROPONINI, MYOGLOBIN in the last 168 hours.  Invalid input(s): CK ------------------------------------------------------------------------------------------------------------------ No results found for: BNP  Inpatient Medications  Scheduled Meds: . Chlorhexidine Gluconate Cloth  6 each Topical Q0600  . heparin   5,000 Units Subcutaneous Q8H  . insulin aspart  0-9 Units Subcutaneous TID WC  . insulin glargine  5 Units Subcutaneous QHS  . latanoprost  1 drop Both Eyes QHS  . sodium bicarbonate  50 mEq Intravenous Once   Continuous Infusions: . sodium chloride    . sodium chloride    . sodium chloride    . sodium chloride     PRN Meds:.sodium chloride, sodium chloride, sodium chloride, sodium chloride, acetaminophen **OR** acetaminophen, heparin, hydrALAZINE, lidocaine (PF), lidocaine-prilocaine, ondansetron **OR** ondansetron (ZOFRAN) IV, pentafluoroprop-tetrafluoroeth  Micro Results Recent Results (from the past 240 hour(s))  MRSA PCR Screening     Status: None   Collection Time: 10/05/17 12:44 AM  Result Value Ref Range Status   MRSA by PCR NEGATIVE NEGATIVE Final    Comment:        The GeneXpert MRSA Assay (FDA approved for NASAL specimens only), is one component of a comprehensive MRSA colonization surveillance program. It is not intended to diagnose MRSA infection nor to guide or monitor treatment for MRSA infections. Performed at [facility], [address] [place], [place] [address]     Radiology Reports Dg Chest 1 View  Result Date: 10/04/2017 CLINICAL DATA:  Patient fell today. Right leg pain. Sleeping more than usual. EXAM: CHEST  1 VIEW COMPARISON:  10/21/2016 FINDINGS: Right central venous catheter with tip over the right atrium. Additional catheter fragment demonstrated in the right axillary region. Shallow inspiration with elevation of the right hemidiaphragm. Cardiac enlargement with mild vascular congestion. No edema or consolidation. Linear atelectasis in the lung bases. No blunting of costophrenic angles. No pneumothorax. Degenerative changes in the shoulders. IMPRESSION: Shallow inspiration with linear atelectasis in the lung bases. Cardiac enlargement with mild vascular congestion. No edema or consolidation. Electronically Signed   By: Lucienne Capers M.D.   On:  10/04/2017 21:05   Dg Pelvis 1-2 Views  Result Date: 10/04/2017 CLINICAL DATA:  Patient fell today. Right leg pain. Sleeping more than usual. EXAM: PELVIS - 1-2 VIEW COMPARISON:  None. FINDINGS: Mild degenerative changes in the hips. There is no evidence of pelvic fracture or diastasis. No pelvic bone lesions are seen. Vascular calcifications. Calcified phleboliths in the pelvis. IMPRESSION: No acute bony abnormalities identified. Electronically Signed   By: Lucienne Capers M.D.   On: 10/04/2017 21:04   Ct Head Wo Contrast  Result Date: 10/04/2017 CLINICAL DATA:  Altered mental status. EXAM: CT HEAD WITHOUT CONTRAST CT CERVICAL SPINE WITHOUT CONTRAST TECHNIQUE: Multidetector CT imaging of the head and cervical spine was performed following the standard protocol without intravenous contrast. Multiplanar CT image reconstructions of the cervical spine were also generated. COMPARISON:  Head CT dated 10/21/2016. FINDINGS: CT HEAD FINDINGS Brain: Again noted is generalized parenchymal atrophy with commensurate dilatation of the ventricles and sulci. Ventricles are stable in size. There is no mass, hemorrhage, edema or other evidence of acute parenchymal abnormality. No extra-axial hemorrhage. Vascular: There are chronic calcified atherosclerotic changes of the large vessels at the skull base. No unexpected hyperdense vessel. Skull: Normal. Negative for fracture or focal lesion. Sinuses/Orbits: No acute  finding. Other: None. CT CERVICAL SPINE FINDINGS Alignment: Mild scoliosis at the cervicothoracic junction. No evidence of acute vertebral body subluxation. Skull base and vertebrae: No fracture line or displaced fracture fragment seen. No acute or suspicious osseous lesion. Soft tissues and spinal canal: No prevertebral fluid or swelling. No visible canal hematoma. Disc levels: Mild disc desiccation at C5-6. No significant central canal stenosis at any level. Upper chest: No acute or significant findings. Other:  Bilateral carotid atherosclerosis. IMPRESSION: 1. No acute intracranial abnormality. No intracranial mass, hemorrhage or edema. 2. No acute or significant findings within the cervical spine. Mild degenerative change at the C5-6 level. 3. Carotid atherosclerosis. Electronically Signed   By: Franki Cabot M.D.   On: 10/04/2017 20:57   Ct Cervical Spine Wo Contrast  Result Date: 10/04/2017 CLINICAL DATA:  Altered mental status. EXAM: CT HEAD WITHOUT CONTRAST CT CERVICAL SPINE WITHOUT CONTRAST TECHNIQUE: Multidetector CT imaging of the head and cervical spine was performed following the standard protocol without intravenous contrast. Multiplanar CT image reconstructions of the cervical spine were also generated. COMPARISON:  Head CT dated 10/21/2016. FINDINGS: CT HEAD FINDINGS Brain: Again noted is generalized parenchymal atrophy with commensurate dilatation of the ventricles and sulci. Ventricles are stable in size. There is no mass, hemorrhage, edema or other evidence of acute parenchymal abnormality. No extra-axial hemorrhage. Vascular: There are chronic calcified atherosclerotic changes of the large vessels at the skull base. No unexpected hyperdense vessel. Skull: Normal. Negative for fracture or focal lesion. Sinuses/Orbits: No acute finding. Other: None. CT CERVICAL SPINE FINDINGS Alignment: Mild scoliosis at the cervicothoracic junction. No evidence of acute vertebral body subluxation. Skull base and vertebrae: No fracture line or displaced fracture fragment seen. No acute or suspicious osseous lesion. Soft tissues and spinal canal: No prevertebral fluid or swelling. No visible canal hematoma. Disc levels: Mild disc desiccation at C5-6. No significant central canal stenosis at any level. Upper chest: No acute or significant findings. Other: Bilateral carotid atherosclerosis. IMPRESSION: 1. No acute intracranial abnormality. No intracranial mass, hemorrhage or edema. 2. No acute or significant findings within  the cervical spine. Mild degenerative change at the C5-6 level. 3. Carotid atherosclerosis. Electronically Signed   By: Franki Cabot M.D.   On: 10/04/2017 20:57   Mr Brain Wo Contrast  Result Date: 10/06/2017 CLINICAL DATA:  Altered level of consciousness for 2 days EXAM: MRI HEAD WITHOUT CONTRAST TECHNIQUE: Multiplanar, multiecho pulse sequences of the brain and surrounding structures were obtained without intravenous contrast. COMPARISON:  Head CT from 2 days ago FINDINGS: Very motion degraded sagittal T1 and diffusion imaging was acquired. No detected infarct. There is atrophy with ventriculomegaly. No detected midline shift. The scan was terminated due to patient request. IMPRESSION: 1. Only severely motion degraded T1 and diffusion imaging could be acquired. 2. No detected infarct or other acute finding. 3. Generalized atrophy. Electronically Signed   By: Monte Fantasia M.D.   On: 10/06/2017 11:08   Dg Femur Portable Min 2 Views Right  Result Date: 10/04/2017 CLINICAL DATA:  Per ED notes: [facility] reports that pt fell today, [facility] and that pt had some right leg pain, pt denies pain at present. Reported that has been sleeping more than usual. EXAM: RIGHT FEMUR PORTABLE 2 VIEW COMPARISON:  None. FINDINGS: No fracture RIGHT femur. No dislocation. Atherosclerotic calcification noted. IMPRESSION: No fracture or dislocation. Electronically Signed   By: Suzy Bouchard M.D.   On: 10/04/2017 21:02    Time Spent in minutes  [place] M.D on 10/07/2017 at 2:53 PM  Between 7am to 7pm - Pager - [phone]  After 7pm go to [email or web address] - password [facility]  [facility] -  Office  [phone]

## 2017-10-07 NOTE — Progress Notes (Signed)
ARIZONA SORN  MRN: [id number]  DOB/AGE: 57-12-1960 57 y.o.  Primary Care Physician:[name], No Pcp Per  Admit date: 10/04/2017  Chief Complaint:  Chief Complaint  [name] presents with  . Fall    S-Pt presented on  10/04/2017 with  Chief Complaint  [name] presents with  . Fall  .    Pt offers no new complaints   Meds . Chlorhexidine Gluconate Cloth  6 each Topical Q0600  . heparin  5,000 Units Subcutaneous Q8H  . insulin aspart  0-9 Units Subcutaneous TID WC  . insulin glargine  5 Units Subcutaneous QHS  . latanoprost  1 drop Both Eyes QHS  . sodium bicarbonate  50 mEq Intravenous Once      Physical Exam: Vital signs in last 24 hours: Temp:  [98 F (36.7 C)-98.6 F (37 C)] 98.3 F (36.8 C) (06/19 0800) Pulse Rate:  [68-82] 72 (06/19 0600) Resp:  [9-20] 12 (06/19 0600) BP: (74-147)/(58-94) 147/77 (06/19 0600) SpO2:  [96 %-100 %] 100 % (06/19 0600) Weight:  [142 lb 10.2 oz (64.7 kg)-143 lb 8.3 oz (65.1 kg)] 142 lb 10.2 oz (64.7 kg) (06/18 1845) Weight change: -4 lb 13.6 oz (-2.2 kg) Last BM Date: 10/05/17  Intake/Output from previous day: 06/18 0701 - 06/19 0700 In: -  Out: 331  No intake/output data recorded.   Physical Exam: General- pt is awake,follows commands Resp- No acute REsp distress, CTA B/L NO Rhonchi CVS- S1S2 regular in rate and rhythm GIT- BS+, soft, NT, ND EXT- NO LE Edema, Cyanosis Access- Temporary cath                  AVF clotted  Lab Results: CBC Recent Labs    10/05/17 0428 10/06/17 0422  WBC 5.6 9.6  HGB 11.6* 13.1  HCT 35.6* 40.9  PLT 166 146*    BMET Recent Labs    10/06/17 0422 10/07/17 0555  NA 137 138  K 6.5* 4.4  CL 103 100*  CO2 23 27  GLUCOSE 198* 121*  BUN 52* 45*  CREATININE 7.85* 8.26*  CALCIUM 9.7 9.3    MICRO Recent Results (from the past 240 hour(s))  MRSA PCR Screening     Status: None   Collection Time: 10/05/17 12:44 AM  Result Value Ref Range Status   MRSA by PCR NEGATIVE NEGATIVE Final     Comment:        The GeneXpert MRSA Assay (FDA approved for NASAL specimens only), is one component of a comprehensive MRSA colonization surveillance program. It is not intended to diagnose MRSA infection nor to guide or monitor treatment for MRSA infections. Performed at [facility], [address], [place], [place] [address]       Lab Results  Component Value Date   CALCIUM 9.3 10/07/2017   PHOS 5.2 (H) 10/07/2017               Impression: 1)Renal ESRD on HD               Pt is on TTS schedule               Pt was last dialyzed yesterday  2)HTN BP at goal   3)Anemia HGb at goal (9--11) NO need of EPO  4)CKD Mineral-Bone Disorder  Phosphorus at goal. Calcium is at goal.  5)CNS-admitted with AMS Primary MD following  6)Electrolytes Hyperkalemic   Now better  NOrmonatremic   7)Acid base Co2 at goal  8) Access-Pt currently has temporary cath  AVF clotted      Will need tunneled cath    Plan:  Will continue current care NO need for HD today Will attempt to contact Interventional radiology to see if tunneled cath can be placed today     Venba Zenner S 10/07/2017, 9:05 AM

## 2017-10-07 NOTE — Clinical Social Work Note (Signed)
LCSW spoke with patient's brother, Cydne Grahn, and advised that patient had received a bed at [facility]. LCSW informed that patient would need to be signed in at the facility. Mr. Iwasaki was agreeable to facility and to signing patient in.    Tawny Asal, Clydene Pugh, LCSW

## 2017-10-07 NOTE — Clinical Social Work Note (Signed)
LCSW following. Per MD, pt will potentially be stable for dc tomorrow. Spoke with [name] at [facility] to update. Per [name], they are still working on getting more information about the injection that pt receives on a monthly basis as the cost is quite high.   Pt has been given a [facility] number. Will follow up with [facility] tomorrow. Attempted to speak with pt's brother, [name], again today but there was no answer and voicemail box was full so could not leave a message. Will keep attempting to reach him.

## 2017-10-07 NOTE — Consult Note (Addendum)
Chief Complaint: Patient was seen in consultation today for tunneled dialysis catheter placement Chief Complaint  Patient presents with  . Fall   at the request of Dr Auburn Bilberry   Supervising Physician: Markus Daft  Patient Status: [facility] IP  History of Present Illness: [name] is a 57 y.o. female   Admitted from [facility]  with AMS to [facility] 6/16 Somnolent and hperkalemia ESRD; schizophrenia; HTN; DM Right arm dialysis fistula is clotted and was seen at [facility] Friday 6/14 for this  Dr Blake Divine placed Left fem temporary cath 6/17 Now needs more stable catheter and ability to use as OP  Request for tunneled IJ catheter placement Scheduled for 1000 am at [facility] Rad 6/20    Past Medical History:  Diagnosis Date  . Bipolar 1 disorder ([place])   . Diabetes mellitus without complication ([place])   . Hypertension   . Schizophrenia ([place])     History reviewed. No pertinent surgical history.  Allergies: Patient has no known allergies.  Medications: Prior to Admission medications   Medication Sig Start Date End Date Taking? Authorizing Provider  aspirin EC 81 MG tablet Take 81 mg by mouth daily.   Yes [provider]  benztropine (COGENTIN) 1 MG tablet Take 1 mg by mouth.   Yes [provider]  buPROPion (WELLBUTRIN XL) 300 MG 24 hr tablet Take 300 mg by mouth daily.   Yes [provider]  divalproex (DEPAKOTE) 500 MG DR tablet Take 500 mg by mouth at bedtime.    Yes [provider]  docusate sodium (COLACE) 100 MG capsule Take 100 mg by mouth.   Yes [provider]  fluPHENAZine (PROLIXIN) 5 MG tablet Take 5 mg by mouth.   Yes [provider]  insulin glargine (LANTUS) 100 UNIT/ML injection Inject 0.05 mLs (5 Units total) into the skin at bedtime. 10/24/16  Yes Tat, Shanon Brow, MD  latanoprost (XALATAN) 0.005 % ophthalmic solution Place 1 drop into both eyes at bedtime.    Yes [provider]  LORazepam (ATIVAN)  0.5 MG tablet Take 0.5 mg by mouth 2 (two) times daily. Take at 8:00am and 8:00pm   Yes [provider]  LYRICA 50 MG capsule Take 50 mg by mouth 3 (three) times daily. 09/22/17  Yes [provider]  Multiple Vitamin (MULTIVITAMIN WITH MINERALS) TABS tablet Take 1 tablet by mouth daily.   Yes [provider]  oxyCODONE-acetaminophen (PERCOCET/ROXICET) 5-325 MG tablet Take 1 tablet by mouth every 8 (eight) hours as needed for severe pain.   Yes [provider]  sevelamer carbonate (RENVELA) 800 MG tablet Take 800 mg by mouth 3 (three) times daily with meals.    Yes [provider]  simvastatin (ZOCOR) 40 MG tablet Take 40 mg by mouth daily.   Yes [provider]  acetaminophen (TYLENOL) 500 MG tablet Take 500 mg by mouth every 6 (six) hours as needed.    [provider]  insulin regular (NOVOLIN R,HUMULIN R) 100 units/mL injection Inject into the skin 3 (three) times daily before meals. >200-249 = 2 250-299=4 300-349=6 350-399=8 400-449=10 [phone]=12 >500 ems    [provider]  paliperidone (INVEGA SUSTENNA) 234 MG/1.5ML SUSP injection Inject 234 mg into the muscle every 30 (thirty) days.    [provider]     History reviewed. No pertinent family history.  Social History   Socioeconomic History  . Marital status: Single    Spouse name: Not on file  .  Number of children: Not on file  . Years of education: Not on file  . Highest education level: Not on file  Occupational History  . Not on file  Social Needs  . Financial resource strain: Not on file  . Food insecurity:    Worry: Not on file    Inability: Not on file  . Transportation needs:    Medical: Not on file    Non-medical: Not on file  Tobacco Use  . Smoking status: Current Every Day Smoker    Types: Cigarettes  Substance and Sexual Activity  . Alcohol use: No  . Drug use: No  . Sexual activity: Not on file  Lifestyle  . Physical activity:     Days per week: Not on file    Minutes per session: Not on file  . Stress: Not on file  Relationships  . Social connections:    Talks on phone: Not on file    Gets together: Not on file    Attends religious service: Not on file    Active member of club or organization: Not on file    Attends meetings of clubs or organizations: Not on file    Relationship status: Not on file  Other Topics Concern  . Not on file  Social History Narrative  . Not on file     Review of Systems: A 12 point ROS discussed and pertinent positives are indicated in the HPI above.  All other systems are negative.  Review of Systems  Constitutional: Negative for activity change, fatigue and fever.  Respiratory: Negative for cough and shortness of breath.   Cardiovascular: Negative for chest pain.  Gastrointestinal: Negative for abdominal pain.  Psychiatric/Behavioral: Positive for confusion and decreased concentration. Negative for behavioral problems.    Vital Signs: BP 114/83   Pulse 72   Temp 98.3 F (36.8 C) (Oral)   Resp (!) 8   Ht 5' (1.524 m)   Wt 142 lb 10.2 oz (64.7 kg)   SpO2 100%   BMI 27.86 kg/m   Physical Exam  Constitutional: She is oriented to person, place, and time.  Cardiovascular: Normal rate and regular rhythm.  Pulmonary/Chest: Effort normal and breath sounds normal.  Abdominal: Soft. Bowel sounds are normal.  Musculoskeletal: Normal range of motion.  Neurological: She is alert and oriented to person, place, and time.  Skin: Skin is warm and dry.  Psychiatric: She has a normal mood and affect. Her behavior is normal.  Pt is not able to answer all questions appropriately Consented with Eda Paschal via phone  Nursing note and vitals reviewed.   Imaging: Dg Chest 1 View  Result Date: 10/04/2017 CLINICAL DATA:  Patient fell today. Right leg pain. Sleeping more than usual. EXAM: CHEST  1 VIEW COMPARISON:  10/21/2016 FINDINGS: Right central venous catheter with tip over  the right atrium. Additional catheter fragment demonstrated in the right axillary region. Shallow inspiration with elevation of the right hemidiaphragm. Cardiac enlargement with mild vascular congestion. No edema or consolidation. Linear atelectasis in the lung bases. No blunting of costophrenic angles. No pneumothorax. Degenerative changes in the shoulders. IMPRESSION: Shallow inspiration with linear atelectasis in the lung bases. Cardiac enlargement with mild vascular congestion. No edema or consolidation. Electronically Signed   By: Lucienne Capers M.D.   On: 10/04/2017 21:05   Dg Pelvis 1-2 Views  Result Date: 10/04/2017 CLINICAL DATA:  Patient fell today. Right leg pain. Sleeping more than usual. EXAM: PELVIS - 1-2 VIEW COMPARISON:  None.  FINDINGS: Mild degenerative changes in the hips. There is no evidence of pelvic fracture or diastasis. No pelvic bone lesions are seen. Vascular calcifications. Calcified phleboliths in the pelvis. IMPRESSION: No acute bony abnormalities identified. Electronically Signed   By: Lucienne Capers M.D.   On: 10/04/2017 21:04   Ct Head Wo Contrast  Result Date: 10/04/2017 CLINICAL DATA:  Altered mental status. EXAM: CT HEAD WITHOUT CONTRAST CT CERVICAL SPINE WITHOUT CONTRAST TECHNIQUE: Multidetector CT imaging of the head and cervical spine was performed following the standard protocol without intravenous contrast. Multiplanar CT image reconstructions of the cervical spine were also generated. COMPARISON:  Head CT dated 10/21/2016. FINDINGS: CT HEAD FINDINGS Brain: Again noted is generalized parenchymal atrophy with commensurate dilatation of the ventricles and sulci. Ventricles are stable in size. There is no mass, hemorrhage, edema or other evidence of acute parenchymal abnormality. No extra-axial hemorrhage. Vascular: There are chronic calcified atherosclerotic changes of the large vessels at the skull base. No unexpected hyperdense vessel. Skull: Normal. Negative for  fracture or focal lesion. Sinuses/Orbits: No acute finding. Other: None. CT CERVICAL SPINE FINDINGS Alignment: Mild scoliosis at the cervicothoracic junction. No evidence of acute vertebral body subluxation. Skull base and vertebrae: No fracture line or displaced fracture fragment seen. No acute or suspicious osseous lesion. Soft tissues and spinal canal: No prevertebral fluid or swelling. No visible canal hematoma. Disc levels: Mild disc desiccation at C5-6. No significant central canal stenosis at any level. Upper chest: No acute or significant findings. Other: Bilateral carotid atherosclerosis. IMPRESSION: 1. No acute intracranial abnormality. No intracranial mass, hemorrhage or edema. 2. No acute or significant findings within the cervical spine. Mild degenerative change at the C5-6 level. 3. Carotid atherosclerosis. Electronically Signed   By: Franki Cabot M.D.   On: 10/04/2017 20:57   Ct Cervical Spine Wo Contrast  Result Date: 10/04/2017 CLINICAL DATA:  Altered mental status. EXAM: CT HEAD WITHOUT CONTRAST CT CERVICAL SPINE WITHOUT CONTRAST TECHNIQUE: Multidetector CT imaging of the head and cervical spine was performed following the standard protocol without intravenous contrast. Multiplanar CT image reconstructions of the cervical spine were also generated. COMPARISON:  Head CT dated 10/21/2016. FINDINGS: CT HEAD FINDINGS Brain: Again noted is generalized parenchymal atrophy with commensurate dilatation of the ventricles and sulci. Ventricles are stable in size. There is no mass, hemorrhage, edema or other evidence of acute parenchymal abnormality. No extra-axial hemorrhage. Vascular: There are chronic calcified atherosclerotic changes of the large vessels at the skull base. No unexpected hyperdense vessel. Skull: Normal. Negative for fracture or focal lesion. Sinuses/Orbits: No acute finding. Other: None. CT CERVICAL SPINE FINDINGS Alignment: Mild scoliosis at the cervicothoracic junction. No evidence  of acute vertebral body subluxation. Skull base and vertebrae: No fracture line or displaced fracture fragment seen. No acute or suspicious osseous lesion. Soft tissues and spinal canal: No prevertebral fluid or swelling. No visible canal hematoma. Disc levels: Mild disc desiccation at C5-6. No significant central canal stenosis at any level. Upper chest: No acute or significant findings. Other: Bilateral carotid atherosclerosis. IMPRESSION: 1. No acute intracranial abnormality. No intracranial mass, hemorrhage or edema. 2. No acute or significant findings within the cervical spine. Mild degenerative change at the C5-6 level. 3. Carotid atherosclerosis. Electronically Signed   By: Franki Cabot M.D.   On: 10/04/2017 20:57   Mr Brain Wo Contrast  Result Date: 10/06/2017 CLINICAL DATA:  Altered level of consciousness for 2 days EXAM: MRI HEAD WITHOUT CONTRAST TECHNIQUE: Multiplanar, multiecho pulse sequences of the brain and  surrounding structures were obtained without intravenous contrast. COMPARISON:  Head CT from 2 days ago FINDINGS: Very motion degraded sagittal T1 and diffusion imaging was acquired. No detected infarct. There is atrophy with ventriculomegaly. No detected midline shift. The scan was terminated due to patient request. IMPRESSION: 1. Only severely motion degraded T1 and diffusion imaging could be acquired. 2. No detected infarct or other acute finding. 3. Generalized atrophy. Electronically Signed   By: Monte Fantasia M.D.   On: 10/06/2017 11:08   Dg Femur Portable Min 2 Views Right  Result Date: 10/04/2017 CLINICAL DATA:  Per ED notes: [facility] reports that pt fell today, pt from [facility] and that pt had some right leg pain, pt denies pain at present. Reported that has been sleeping more than usual. EXAM: RIGHT FEMUR PORTABLE 2 VIEW COMPARISON:  None. FINDINGS: No fracture RIGHT femur. No dislocation. Atherosclerotic calcification noted. IMPRESSION: No fracture or dislocation.  Electronically Signed   By: Suzy Bouchard M.D.   On: 10/04/2017 21:02    Labs:  CBC: Recent Labs    10/24/16 0646 10/04/17 1819 10/05/17 0428 10/06/17 0422  WBC 5.2 5.2 5.6 9.6  HGB 10.5* 10.8* 11.6* 13.1  HCT 31.2* 33.9* 35.6* 40.9  PLT 250 205 166 146*    COAGS: Recent Labs    10/05/17 1009  INR 1.08    BMP: Recent Labs    10/04/17 2320 10/05/17 0428 10/06/17 0422 10/07/17 0555  NA 134* 138 137 138  K 5.9* 5.7* 6.5* 4.4  CL 97* 103 103 100*  CO2 26 24 23 27   GLUCOSE 298* 228* 198* 121*  BUN 65* 69* 52* 45*  CALCIUM 10.0 9.8 9.7 9.3  CREATININE 9.36* 9.39* 7.85* 8.26*  GFRNONAA 4* 4* 5* 5*  GFRAA 5* 5* 6* 6*    LIVER FUNCTION TESTS: Recent Labs    10/21/16 2156 10/22/16 0743  10/04/17 1819 10/05/17 0428 10/06/17 0422 10/07/17 0555  BILITOT 0.4 0.6  --   --  0.5  --   --   AST 15 18  --   --  16  --   --   ALT 14 14  --   --  14  --   --   ALKPHOS 87 90  --   --  96  --   --   PROT 7.5 7.6  --   --  7.6  --   --   ALBUMIN 3.6 3.6   < > 3.3* 3.3* 3.5 3.0*   < > = values in this interval not displayed.    TUMOR MARKERS: No results for input(s): AFPTM, CEA, CA199, CHROMGRNA in the last 8760 hours.  Assessment and Plan:  Tunneled dialysis catheter placement scheduled at [facility] 6/20 To be at [facility] 900 am 6/20 via ambulance Will return to [facility] after procedure Risks and benefits discussed with the patient's brother Legrand Como including, but not limited to bleeding, infection, vascular injury, pneumothorax which may require chest tube placement, air embolism or even death  All of his questions were answered, he is agreeable to proceed. Consent signed and in chart.   Thank you for this interesting consult.  I greatly enjoyed meeting JENIN BIRDSALL and look forward to participating in their care.  A copy of this report was sent to the requesting provider on this date.  Electronically Signed: Lavonia Drafts, PA-C 10/07/2017, 11:07 AM   I spent  a total of 20 Minutes    in face to face  in clinical consultation, greater than 50% of which was counseling/coordinating care for HD tunneled catheter

## 2017-10-07 NOTE — Progress Notes (Signed)
Patient NPO, CBG 80, order to provide lantus. Will continue to monitor.

## 2017-10-08 ENCOUNTER — Ambulatory Visit (HOSPITAL_COMMUNITY)
Admit: 2017-10-08 | Discharge: 2017-10-08 | Disposition: A | Discharge status: Home / Self Care | Payer: Medicaid Other | Source: Ambulatory Visit | Attending: Nephrology | Admitting: Nephrology

## 2017-10-08 ENCOUNTER — Encounter (HOSPITAL_COMMUNITY): Payer: Self-pay

## 2017-10-08 DIAGNOSIS — Z992 Dependence on renal dialysis: Secondary | ICD-10-CM | POA: Insufficient documentation

## 2017-10-08 DIAGNOSIS — N186 End stage renal disease: Secondary | ICD-10-CM | POA: Insufficient documentation

## 2017-10-08 DIAGNOSIS — F209 Schizophrenia, unspecified: Secondary | ICD-10-CM

## 2017-10-08 HISTORY — PX: IR US GUIDE VASC ACCESS LEFT: IMG2389

## 2017-10-08 HISTORY — PX: IR FLUORO GUIDE CV LINE LEFT: IMG2282

## 2017-10-08 LAB — GLUCOSE, CAPILLARY
GLUCOSE-CAPILLARY: 294 mg/dL — AB (ref 65–99)
GLUCOSE-CAPILLARY: 127 mg/dL — AB (ref 65–99)
Glucose-Capillary: 105 mg/dL — ABNORMAL HIGH (ref 65–99)
Glucose-Capillary: 150 mg/dL — ABNORMAL HIGH (ref 65–99)

## 2017-10-08 LAB — TSH: TSH: 5.876 u[IU]/mL — AB (ref 0.350–4.500)

## 2017-10-08 LAB — T4, FREE: FREE T4: 0.96 ng/dL (ref 0.82–1.77)

## 2017-10-08 MED ORDER — CEFAZOLIN SODIUM-DEXTROSE 2-4 GM/100ML-% IV SOLN
INTRAVENOUS | Status: AC
  Administered 2017-10-08: 2 g via INTRAVENOUS
  Filled 2017-10-08: qty 100

## 2017-10-08 MED ORDER — MIDAZOLAM HCL 2 MG/2ML IJ SOLN
INTRAMUSCULAR | Status: AC
  Filled 2017-10-08: qty 4

## 2017-10-08 MED ORDER — CEFAZOLIN SODIUM-DEXTROSE 2-4 GM/100ML-% IV SOLN: 2.0000 g | Freq: Once | INTRAVENOUS | Status: DC

## 2017-10-08 MED ORDER — LIDOCAINE HCL (PF) 1 % IJ SOLN
INTRAMUSCULAR | Status: AC | PRN
  Administered 2017-10-08: 10 mL

## 2017-10-08 MED ORDER — MIDAZOLAM HCL 2 MG/2ML IJ SOLN
INTRAMUSCULAR | Status: AC | PRN
  Administered 2017-10-08: 1 mg via INTRAVENOUS

## 2017-10-08 MED ORDER — FENTANYL CITRATE (PF) 100 MCG/2ML IJ SOLN
INTRAMUSCULAR | Status: AC | PRN
  Administered 2017-10-08: 50 ug via INTRAVENOUS

## 2017-10-08 MED ORDER — CEFAZOLIN SODIUM-DEXTROSE 2-4 GM/100ML-% IV SOLN
2.0000 g | Freq: Once | INTRAVENOUS | Status: DC
  Administered 2017-10-08: 2 g via INTRAVENOUS

## 2017-10-08 MED ORDER — FENTANYL CITRATE (PF) 100 MCG/2ML IJ SOLN
INTRAMUSCULAR | Status: AC
  Filled 2017-10-08: qty 4

## 2017-10-08 MED ORDER — CEFAZOLIN SODIUM-DEXTROSE 2-4 GM/100ML-% IV SOLN: 2.0000 g | Freq: Three times a day (TID) | INTRAVENOUS | Status: DC

## 2017-10-08 MED ORDER — LIDOCAINE HCL 1 % IJ SOLN
INTRAMUSCULAR | Status: AC
  Filled 2017-10-08: qty 20

## 2017-10-08 MED ORDER — HEPARIN SODIUM (PORCINE) 1000 UNIT/ML IJ SOLN
INTRAMUSCULAR | Status: AC
  Filled 2017-10-08: qty 1

## 2017-10-08 NOTE — Procedures (Signed)
     HEMODIALYSIS TREATMENT NOTE:   3.5 hour heparin-free dialysis completed via newly placed left IJ tunneled PC.  Goal NOT met: Unable to tolerate removal of 2L as ordered, even at minimal UF rate.  Net UF 500cc.  All blood was returned.   Rockwell Alexandria, RN, CDN

## 2017-10-08 NOTE — Sedation Documentation (Signed)
Patient denies pain and is resting comfortably.  

## 2017-10-08 NOTE — Sedation Documentation (Addendum)
Ancef 2g given IV prior to procedure. See [facility]

## 2017-10-08 NOTE — Progress Notes (Signed)
PROGRESS NOTE                                                                                                                                                                                                             Patient Demographics:    [name], is a 57 y.o. female, DOB - January 29, 1961, UJW:[id number]  Admit date - 10/04/2017   Admitting Physician Reubin Milan, MD  Outpatient Primary MD for the patient is Patient, No Pcp Per  LOS - 3  Outpatient Specialists: Nephrology  Chief Complaint  Patient presents with  . 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  Admitted for further management.    Subjective:   Mental status continues to improve.  Had a tunneled dialysis catheter placed at [facility] by IR today.   Assessment  & Plan :    Principal Problem: Acute metabolic encephalopathy Suspect this is uremic with combination of her underlying bipolar disorder and schizophrenia.  She is also on multiple medications including Cogentin, Wellbutrin, Depakote, Prolixin, Ativan and Lyrica.  I will resume all of them except for Ativan and Lyrica. No signs of infection.  B12 was normal.  Check TSH.  MRI of the brain shows generalized atrophy. Psych meds have been resumed. Status continues to improve and I suspect this is her baseline.  Active Problems: Bipolar disorder/schizophrenia Resident of group home.  Not sure what her baseline mental status is.  Home meds have been resumed except for Ativan and Lyrica.   ESRD (end stage renal disease) ([place]) Thrombosed AV graft.  Tunneled dialysis catheter placed by IR today.  We will get her dialysis tomorrow and possibly can be discharged. Nephrology  following.   Diabetes mellitus type 2 CBG stable.  Continue Lantus with sliding scale coverage.     Hyperkalemia Resolved with dialysis.    Anemia in ESRD (end-stage renal disease) ([place]) Stable.   Essential hypertension Stable.  Continue home meds      Code Status : full code  Family Communication  : none at bedside  Disposition Plan  : Possibly SNF after dialysis tomorrow.  Barriers For  Discharge : Hemodialysis on new dialysis catheter tomorrow.  Consults  : Renal, surgery, IR  Procedures  : Right femoral catheter, MRI brain  DVT Prophylaxis  : Subcu heparin  Lab Results  Component Value Date   PLT 146 (L) 10/06/2017    Antibiotics  :    Anti-infectives (From admission, onward)   Start     Dose/Rate Route Frequency Ordered Stop   10/08/17 1400  ceFAZolin (ANCEF) IVPB 2g/100 mL premix  Status:  Discontinued     2 g 200 mL/hr over 30 Minutes Intravenous Every 8 hours 10/08/17 1224 10/08/17 1225   10/08/17 1230  ceFAZolin (ANCEF) IVPB 2g/100 mL premix     2 g 200 mL/hr over 30 Minutes Intravenous  Once 10/08/17 1225          Objective:   Vitals:   10/07/17 2137 10/08/17 0024 10/08/17 0556 10/08/17 0747  BP: (!) 168/81 (!) 178/75 (!) 183/82 133/74  Pulse: 69  80 74  Resp: '16  16 20  ' Temp: 98.7 F (37.1 C)  97.8 F (36.6 C) 97.9 F (36.6 C)  TempSrc: Oral  Oral Oral  SpO2: 100%  100% 99%  Weight:      Height:        Wt Readings from Last 3 Encounters:  10/06/17 64.7 kg (142 lb 10.2 oz)  10/24/16 71.5 kg (157 lb 10.1 oz)    No intake or output data in the 24 hours ending 10/08/17 1508  Physical exam Not in distress HEENT: Moist mucosa, supple neck Chest: Clear bilaterally CVS: Normal S1 and S2, no murmurs GI: Soft, nondistended, nontender Musculoskeletal: Warm, no edema, new HD catheter in place CNS: Alert and oriented x2     Data Review:    CBC Recent Labs  Lab 10/04/17 1819 10/05/17 0428 10/06/17 0422  WBC 5.2 5.6 9.6  HGB  10.8* 11.6* 13.1  HCT 33.9* 35.6* 40.9  PLT 205 166 146*  MCV 92.4 92.0 93.4  MCH 29.4 30.0 29.9  MCHC 31.9 32.6 32.0  RDW 15.3 15.4 15.6*  LYMPHSABS 1.6  --   --   MONOABS 0.5  --   --   EOSABS 0.2  --   --   BASOSABS 0.0  --   --     Chemistries  Recent Labs  Lab 10/04/17 1819 10/04/17 2320 10/05/17 0428 10/06/17 0422 10/07/17 0555  NA 132* 134* 138 137 138  K 6.1* 5.9* 5.7* 6.5* 4.4  CL 96* 97* 103 103 100*  CO2 '26 26 24 23 27  ' GLUCOSE 328* 298* 228* 198* 121*  BUN 65* 65* 69* 52* 45*  CREATININE 9.36* 9.36* 9.39* 7.85* 8.26*  CALCIUM 9.6 10.0 9.8 9.7 9.3  AST  --   --  16  --   --   ALT  --   --  14  --   --   ALKPHOS  --   --  96  --   --   BILITOT  --   --  0.5  --   --    ------------------------------------------------------------------------------------------------------------------ No results for input(s): CHOL, HDL, LDLCALC, TRIG, CHOLHDL, LDLDIRECT in the last 72 hours.  Lab Results  Component Value Date   HGBA1C 6.5 (H) 10/21/2016   ------------------------------------------------------------------------------------------------------------------ No results for input(s): TSH, T4TOTAL, T3FREE, THYROIDAB in the last 72 hours.  Invalid input(s): FREET3 ------------------------------------------------------------------------------------------------------------------ Recent Labs    10/07/17 0759  VITAMINB12 985*    Coagulation profile Recent Labs  Lab 10/05/17 1009  INR 1.08  No results for input(s): DDIMER in the last 72 hours.  Cardiac Enzymes No results for input(s): CKMB, TROPONINI, MYOGLOBIN in the last 168 hours.  Invalid input(s): CK ------------------------------------------------------------------------------------------------------------------ No results found for: BNP  Inpatient Medications  Scheduled Meds: . aspirin EC  81 mg Oral Daily  . benztropine  1 mg Oral Daily  . buPROPion  300 mg Oral Daily  . Chlorhexidine  Gluconate Cloth  6 each Topical Q0600  . divalproex  500 mg Oral QHS  . fluPHENAZine  5 mg Oral Daily  . insulin aspart  0-9 Units Subcutaneous TID WC  . insulin glargine  5 Units Subcutaneous QHS  . latanoprost  1 drop Both Eyes QHS  . multivitamin with minerals  1 tablet Oral Daily  . simvastatin  40 mg Oral Daily  . sodium bicarbonate  50 mEq Intravenous Once   Continuous Infusions: . sodium chloride    . sodium chloride    . sodium chloride    . sodium chloride    .  ceFAZolin (ANCEF) IV     PRN Meds:.sodium chloride, sodium chloride, sodium chloride, sodium chloride, acetaminophen **OR** acetaminophen, heparin, hydrALAZINE, lidocaine (PF), lidocaine-prilocaine, ondansetron **OR** ondansetron (ZOFRAN) IV, pentafluoroprop-tetrafluoroeth  Micro Results Recent Results (from the past 240 hour(s))  MRSA PCR Screening     Status: None   Collection Time: 10/05/17 12:44 AM  Result Value Ref Range Status   MRSA by PCR NEGATIVE NEGATIVE Final    Comment:        The GeneXpert MRSA Assay (FDA approved for NASAL specimens only), is one component of a comprehensive MRSA colonization surveillance program. It is not intended to diagnose MRSA infection nor to guide or monitor treatment for MRSA infections. Performed at [facility], [address], [place], [place] [address]     Radiology Reports Dg Chest 1 View  Result Date: 10/04/2017 CLINICAL DATA:  Patient fell today. Right leg pain. Sleeping more than usual. EXAM: CHEST  1 VIEW COMPARISON:  10/21/2016 FINDINGS: Right central venous catheter with tip over the right atrium. Additional catheter fragment demonstrated in the right axillary region. Shallow inspiration with elevation of the right hemidiaphragm. Cardiac enlargement with mild vascular congestion. No edema or consolidation. Linear atelectasis in the lung bases. No blunting of costophrenic angles. No pneumothorax. Degenerative changes in the shoulders. IMPRESSION: Shallow  inspiration with linear atelectasis in the lung bases. Cardiac enlargement with mild vascular congestion. No edema or consolidation. Electronically Signed   By: Lucienne Capers M.D.   On: 10/04/2017 21:05   Dg Pelvis 1-2 Views  Result Date: 10/04/2017 CLINICAL DATA:  Patient fell today. Right leg pain. Sleeping more than usual. EXAM: PELVIS - 1-2 VIEW COMPARISON:  None. FINDINGS: Mild degenerative changes in the hips. There is no evidence of pelvic fracture or diastasis. No pelvic bone lesions are seen. Vascular calcifications. Calcified phleboliths in the pelvis. IMPRESSION: No acute bony abnormalities identified. Electronically Signed   By: Lucienne Capers M.D.   On: 10/04/2017 21:04   Ct Head Wo Contrast  Result Date: 10/04/2017 CLINICAL DATA:  Altered mental status. EXAM: CT HEAD WITHOUT CONTRAST CT CERVICAL SPINE WITHOUT CONTRAST TECHNIQUE: Multidetector CT imaging of the head and cervical spine was performed following the standard protocol without intravenous contrast. Multiplanar CT image reconstructions of the cervical spine were also generated. COMPARISON:  Head CT dated 10/21/2016. FINDINGS: CT HEAD FINDINGS Brain: Again noted is generalized parenchymal atrophy with commensurate dilatation of the ventricles and sulci. Ventricles are stable in size. There  is no mass, hemorrhage, edema or other evidence of acute parenchymal abnormality. No extra-axial hemorrhage. Vascular: There are chronic calcified atherosclerotic changes of the large vessels at the skull base. No unexpected hyperdense vessel. Skull: Normal. Negative for fracture or focal lesion. Sinuses/Orbits: No acute finding. Other: None. CT CERVICAL SPINE FINDINGS Alignment: Mild scoliosis at the cervicothoracic junction. No evidence of acute vertebral body subluxation. Skull base and vertebrae: No fracture line or displaced fracture fragment seen. No acute or suspicious osseous lesion. Soft tissues and spinal canal: No prevertebral fluid or  swelling. No visible canal hematoma. Disc levels: Mild disc desiccation at C5-6. No significant central canal stenosis at any level. Upper chest: No acute or significant findings. Other: Bilateral carotid atherosclerosis. IMPRESSION: 1. No acute intracranial abnormality. No intracranial mass, hemorrhage or edema. 2. No acute or significant findings within the cervical spine. Mild degenerative change at the C5-6 level. 3. Carotid atherosclerosis. Electronically Signed   By: Franki Cabot M.D.   On: 10/04/2017 20:57   Ct Cervical Spine Wo Contrast  Result Date: 10/04/2017 CLINICAL DATA:  Altered mental status. EXAM: CT HEAD WITHOUT CONTRAST CT CERVICAL SPINE WITHOUT CONTRAST TECHNIQUE: Multidetector CT imaging of the head and cervical spine was performed following the standard protocol without intravenous contrast. Multiplanar CT image reconstructions of the cervical spine were also generated. COMPARISON:  Head CT dated 10/21/2016. FINDINGS: CT HEAD FINDINGS Brain: Again noted is generalized parenchymal atrophy with commensurate dilatation of the ventricles and sulci. Ventricles are stable in size. There is no mass, hemorrhage, edema or other evidence of acute parenchymal abnormality. No extra-axial hemorrhage. Vascular: There are chronic calcified atherosclerotic changes of the large vessels at the skull base. No unexpected hyperdense vessel. Skull: Normal. Negative for fracture or focal lesion. Sinuses/Orbits: No acute finding. Other: None. CT CERVICAL SPINE FINDINGS Alignment: Mild scoliosis at the cervicothoracic junction. No evidence of acute vertebral body subluxation. Skull base and vertebrae: No fracture line or displaced fracture fragment seen. No acute or suspicious osseous lesion. Soft tissues and spinal canal: No prevertebral fluid or swelling. No visible canal hematoma. Disc levels: Mild disc desiccation at C5-6. No significant central canal stenosis at any level. Upper chest: No acute or significant  findings. Other: Bilateral carotid atherosclerosis. IMPRESSION: 1. No acute intracranial abnormality. No intracranial mass, hemorrhage or edema. 2. No acute or significant findings within the cervical spine. Mild degenerative change at the C5-6 level. 3. Carotid atherosclerosis. Electronically Signed   By: Franki Cabot M.D.   On: 10/04/2017 20:57   Mr Brain Wo Contrast  Result Date: 10/06/2017 CLINICAL DATA:  Altered level of consciousness for 2 days EXAM: MRI HEAD WITHOUT CONTRAST TECHNIQUE: Multiplanar, multiecho pulse sequences of the brain and surrounding structures were obtained without intravenous contrast. COMPARISON:  Head CT from 2 days ago FINDINGS: Very motion degraded sagittal T1 and diffusion imaging was acquired. No detected infarct. There is atrophy with ventriculomegaly. No detected midline shift. The scan was terminated due to patient request. IMPRESSION: 1. Only severely motion degraded T1 and diffusion imaging could be acquired. 2. No detected infarct or other acute finding. 3. Generalized atrophy. Electronically Signed   By: Monte Fantasia M.D.   On: 10/06/2017 11:08   Ir Fluoro Guide Cv Line Left  Result Date: 10/08/2017 INDICATION: History of end-stage renal disease now with clotted dialysis graft. [facility] made for placement tunneled hemodialysis catheter. EXAM: TUNNELED CENTRAL VENOUS HEMODIALYSIS CATHETER PLACEMENT WITH ULTRASOUND AND FLUOROSCOPIC GUIDANCE MEDICATIONS: Ancef 2 gm IV . The antibiotic was given  in an appropriate time interval prior to skin puncture. ANESTHESIA/SEDATION: Versed 1 mg IV; Fentanyl 50 mcg IV; Moderate Sedation Time: 13 minutes The patient was continuously monitored during the procedure by the interventional radiology nurse under my direct supervision. FLUOROSCOPY TIME:  Fluoroscopy Time: 18 seconds (3 mGy). COMPLICATIONS: None immediate. PROCEDURE: Informed written consent was obtained from the patient's family after a discussion of the risks, benefits, and  alternatives to treatment. Questions regarding the procedure were encouraged and answered. The left neck and chest were prepped with chlorhexidine in a sterile fashion, and a sterile drape was applied covering the operative field. Maximum barrier sterile technique with sterile gowns and gloves were used for the procedure. A timeout was performed prior to the initiation of the procedure. After creating a small venotomy incision, a micropuncture kit was utilized to access the internal jugular vein. Real-time ultrasound guidance was utilized for vascular access including the acquisition of a permanent ultrasound image documenting patency of the accessed vessel. The microwire was utilized to measure appropriate catheter length. A stiff Glidewire was advanced to the level of the IVC and the micropuncture sheath was exchanged for a peel-away sheath. A palindrome tunneled hemodialysis catheter measuring 19 cm from tip to cuff was tunneled in a retrograde fashion from the anterior chest wall to the venotomy incision. The catheter was then placed through the peel-away sheath with tips ultimately positioned within the superior aspect of the right atrium. Final catheter positioning was confirmed and documented with a spot radiographic image. Note is made of an abandoned right chest wall hero graft. The catheter aspirates and flushes normally. The catheter was flushed with appropriate volume heparin dwells. The catheter exit site was secured with a 0-Prolene retention suture. The venotomy incision was closed with an interrupted 4-0 Vicryl, Dermabond and Steri-strips. Dressings were applied. The patient tolerated the procedure well without immediate post procedural complication. IMPRESSION: Successful placement of 19 cm tip to cuff tunneled hemodialysis catheter via the left internal jugular vein with tips terminating within the superior aspect of the right atrium. The catheter is ready for immediate use. Electronically Signed    By: Sandi Mariscal M.D.   On: 10/08/2017 13:11   Ir US Guide Vasc Access Left  Result Date: 10/08/2017 INDICATION: History of end-stage renal disease now with clotted dialysis graft. [facility] made for placement tunneled hemodialysis catheter. EXAM: TUNNELED CENTRAL VENOUS HEMODIALYSIS CATHETER PLACEMENT WITH ULTRASOUND AND FLUOROSCOPIC GUIDANCE MEDICATIONS: Ancef 2 gm IV . The antibiotic was given in an appropriate time interval prior to skin puncture. ANESTHESIA/SEDATION: Versed 1 mg IV; Fentanyl 50 mcg IV; Moderate Sedation Time: 13 minutes The patient was continuously monitored during the procedure by the interventional radiology nurse under my direct supervision. FLUOROSCOPY TIME:  Fluoroscopy Time: 18 seconds (3 mGy). COMPLICATIONS: None immediate. PROCEDURE: Informed written consent was obtained from the patient's family after a discussion of the risks, benefits, and alternatives to treatment. Questions regarding the procedure were encouraged and answered. The left neck and chest were prepped with chlorhexidine in a sterile fashion, and a sterile drape was applied covering the operative field. Maximum barrier sterile technique with sterile gowns and gloves were used for the procedure. A timeout was performed prior to the initiation of the procedure. After creating a small venotomy incision, a micropuncture kit was utilized to access the internal jugular vein. Real-time ultrasound guidance was utilized for vascular access including the acquisition of a permanent ultrasound image documenting patency of the accessed vessel. The microwire was utilized to measure  appropriate catheter length. A stiff Glidewire was advanced to the level of the IVC and the micropuncture sheath was exchanged for a peel-away sheath. A palindrome tunneled hemodialysis catheter measuring 19 cm from tip to cuff was tunneled in a retrograde fashion from the anterior chest wall to the venotomy incision. The catheter was then placed through the  peel-away sheath with tips ultimately positioned within the superior aspect of the right atrium. Final catheter positioning was confirmed and documented with a spot radiographic image. Note is made of an abandoned right chest wall hero graft. The catheter aspirates and flushes normally. The catheter was flushed with appropriate volume heparin dwells. The catheter exit site was secured with a 0-Prolene retention suture. The venotomy incision was closed with an interrupted 4-0 Vicryl, Dermabond and Steri-strips. Dressings were applied. The patient tolerated the procedure well without immediate post procedural complication. IMPRESSION: Successful placement of 19 cm tip to cuff tunneled hemodialysis catheter via the left internal jugular vein with tips terminating within the superior aspect of the right atrium. The catheter is ready for immediate use. Electronically Signed   By: Sandi Mariscal M.D.   On: 10/08/2017 13:11   Dg Femur Portable Min 2 Views Right  Result Date: 10/04/2017 CLINICAL DATA:  Per ED notes: [facility] reports that pt fell today, pt from [facility] and that pt had some right leg pain, pt denies pain at present. Reported that has been sleeping more than usual. EXAM: RIGHT FEMUR PORTABLE 2 VIEW COMPARISON:  None. FINDINGS: No fracture RIGHT femur. No dislocation. Atherosclerotic calcification noted. IMPRESSION: No fracture or dislocation. Electronically Signed   By: Suzy Bouchard M.D.   On: 10/04/2017 21:02    Time Spent in minutes  25   Deshon Hsiao M.D on 10/08/2017 at 3:08 PM  Between 7am to 7pm - Pager - [phone]  After 7pm go to [email or web address] - password [facility]  [facility] -  Office  [phone]

## 2017-10-08 NOTE — Sedation Documentation (Signed)
Arrived via [facility] from [facility]

## 2017-10-08 NOTE — Progress Notes (Addendum)
[name]  MRN: [id number]  DOB/AGE: [birth year] 57 y.o.  Primary Care Physician:Patient, No Pcp Per  Admit date: 10/04/2017  Chief Complaint:  Chief Complaint  Patient presents with  . Fall    Padilla-Pt presented on  10/04/2017 with  Chief Complaint  Patient presents with  . Fall  .    Pt offers no new complaints   Meds . aspirin EC  81 mg Oral Daily  . benztropine  1 mg Oral Daily  . buPROPion  300 mg Oral Daily  . Chlorhexidine Gluconate Cloth  6 each Topical Q0600  . divalproex  500 mg Oral QHS  . fluPHENAZine  5 mg Oral Daily  . insulin aspart  0-9 Units Subcutaneous TID WC  . insulin glargine  5 Units Subcutaneous QHS  . latanoprost  1 drop Both Eyes QHS  . multivitamin with minerals  1 tablet Oral Daily  . simvastatin  40 mg Oral Daily  . sodium bicarbonate  50 mEq Intravenous Once      Physical Exam: Vital signs in last 24 hours: Temp:  [97.8 F (36.6 C)-98.7 F (37.1 C)] 97.9 F (36.6 C) (06/20 0747) Pulse Rate:  [69-80] 74 (06/20 1330) Resp:  [14-20] 16 (06/20 1330) BP: (112-183)/(70-91) 116/74 (06/20 1330) SpO2:  [97 %-100 %] 98 % (06/20 1330) Weight change:  Last BM Date: 10/05/17  Intake/Output from previous day: No intake/output data recorded. No intake/output data recorded.   Physical Exam: General- pt is awake,follows commands Resp- No acute REsp distress, CTA B/L NO Rhonchi CVS- S1S2 regular in rate and rhythm GIT- BS+, soft, NT, ND EXT- NO LE Edema, Cyanosis Access- Tunneled  Cath left chest                  AVF clotted                 Temporary cath in situ  Lab Results: CBC Recent Labs    10/06/17 0422  WBC 9.6  HGB 13.1  HCT 40.9  PLT 146*    BMET Recent Labs    10/06/17 0422 10/07/17 0555  NA 137 138  K 6.5* 4.4  CL 103 100*  CO2 23 27  GLUCOSE 198* 121*  BUN 52* 45*  CREATININE 7.85* 8.26*  CALCIUM 9.7 9.3    MICRO Recent Results (from the past 240 hour(Padilla))  MRSA PCR Screening     Status: None   Collection Time: 10/05/17 12:44 AM  Result Value Ref Range Status   MRSA by PCR NEGATIVE NEGATIVE Final    Comment:        The GeneXpert MRSA Assay (FDA approved for NASAL specimens only), is one component of a comprehensive MRSA colonization surveillance program. It is not intended to diagnose MRSA infection nor to guide or monitor treatment for MRSA infections. Performed at [facility], [address], [place], [place] [address]       Lab Results  Component Value Date   CALCIUM 9.3 10/07/2017   PHOS 5.2 (H) 10/07/2017               Impression: 1)Renal ESRD on HD               Pt is on TTS schedule               Pt will be dialyzed today  2)HTN BP at goal   3)Anemia HGb at goal (9--11) NO need of EPO  4)CKD Mineral-Bone Disorder  Phosphorus at goal  for ESRD Calcium is at goal.  5)CNS-admitted with AMS Primary MD following  6)Electrolytes Hyperkalemic   Now better  NOrmonatremic   7)Acid base Co2 at goal  8) Access-Pt currently has temporary cath      AVF clotted      tunneled cath to be placed today   Plan:  Interventional radiology to place tunneled cath today  Will dialyze today after the cath placement.  Addendum Pt seen after Tunneled cath placement Chest PC in situ Temporary cath in situ  Plan Will dialyze now Will remove temporary cath   [name] 10/08/2017, 1:45 PM

## 2017-10-08 NOTE — Procedures (Signed)
Pre-procedure Diagnosis: ESRD Post-procedure Diagnosis: Same  Successful placement of tunneled HD catheter with tips terminating within the superior aspect of the right atrium.    Complications: None Immediate  EBL: Minimal   The catheter is ready for immediate use.   Jay Dyann Goodspeed, MD Pager #: [phone]   

## 2017-10-08 NOTE — Sedation Documentation (Signed)
[facility] arrived for transport 

## 2017-10-08 NOTE — Sedation Documentation (Signed)
Transported patient to nurses station while waiting for [facility] transport

## 2017-10-08 NOTE — Clinical Social Work Note (Signed)
LCSW following. Working on placement for pt. [name], the administrator for [facility], who just took over this home two months ago, continues to state that he does not feel that it is appropriate for pt to return to the care home. He feels she needs a higher level of care. He states that the dialysis staff have also stated that they believe pt needs a higher level of care. Have referred pt to [facility] and they will take her if she is not on the [place] injection because the cost is close to $3000 per month. Pt only has Medicaid coverage and this one medication is cost prohibitive.   Have referred to other SNF's at this point. Also calling to Assisted Living Facilities. Attempting to reach the psychiatrist (Dr. Bernita Raisin) prescribing the medication as well to ask if it can be changed to the oral version which is less expensive.   Discussed with CSW Supervisor, Nathaniel Man, as well.   Per MD, pt may be stable for dc tomorrow.

## 2017-10-09 LAB — GLUCOSE, CAPILLARY
GLUCOSE-CAPILLARY: 125 mg/dL — AB (ref 65–99)
GLUCOSE-CAPILLARY: 196 mg/dL — AB (ref 65–99)
GLUCOSE-CAPILLARY: 202 mg/dL — AB (ref 65–99)
Glucose-Capillary: 150 mg/dL — ABNORMAL HIGH (ref 65–99)
Glucose-Capillary: 195 mg/dL — ABNORMAL HIGH (ref 65–99)
Glucose-Capillary: 77 mg/dL (ref 65–99)

## 2017-10-09 LAB — T3, FREE: T3, Free: 1.6 pg/mL — ABNORMAL LOW (ref 2.0–4.4)

## 2017-10-09 NOTE — NC FL2 (Signed)
[place] LEVEL OF CARE SCREENING TOOL     IDENTIFICATION  Patient Name: [name] Birthdate: 01/21/1961 Sex: female Admission Date (Current Location): 10/04/2017  [place] and [place] Number:  [place] [facility] [id number] [place] and Address:  [facility] [address], [place]      Provider Number: [id number]  Attending Physician Name and Address:  Louellen Molder, MD  Relative Name and Phone Number:  Maleeya Peterkin (brother) [phone]    Current Level of Care: Hospital Recommended Level of Care: [place] Prior Approval Number:    Date Approved/Denied:   PASRR Number: pending  Discharge Plan: Other (Comment)(ALF)    Current Diagnoses: Patient Active Problem List   Diagnosis Date Noted  . 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 Schizophrenia ([place]) 10/04/2017  . Hyperkalemia 10/04/2017  . Anemia in ESRD (end-stage renal disease) ([place]) 10/04/2017  . Dysphasia   . Altered mental status 10/22/2016  . ESRD (end stage renal disease) ([place]) 10/22/2016  . Bipolar 1 disorder ([place]) 10/22/2016  . Diabetes mellitus ([place]) 10/22/2016  . Essential hypertension 10/22/2016  . Acute encephalopathy 10/22/2016    Orientation RESPIRATION BLADDER Height & Weight     Place  Normal Continent Weight: 149 lb 7.6 oz (67.8 kg) Height:  5' (152.4 cm)  BEHAVIORAL SYMPTOMS/MOOD NEUROLOGICAL BOWEL NUTRITION STATUS      Continent Diet(see dc summary)  AMBULATORY STATUS COMMUNICATION OF NEEDS Skin   Independent(with walker) Verbally Normal                       Personal Care Assistance Level of Assistance  Bathing, Feeding, Dressing Bathing Assistance: Limited assistance Feeding assistance: Independent Dressing Assistance: Limited assistance     Functional Limitations Info  Sight, Hearing, Speech Sight Info: Adequate Hearing Info: Adequate Speech Info: Adequate    SPECIAL CARE FACTORS FREQUENCY                        Contractures Contractures Info: Not present    Additional Factors Info  Code Status, Allergies, Psychotropic Code Status Info: full Allergies Info: No Known Allergies Psychotropic Info: Wellbutrin, Depakote, Ativan, Paliperidone         Current Medications (10/09/2017):  This is the current hospital active medication list Current Facility-Administered Medications  Medication Dose Route Frequency Provider Last Rate Last Dose  . 0.9 %  sodium chloride infusion  100 mL Intravenous PRN Befekadu, Eli Phillips, MD      . 0.9 %  sodium chloride infusion  100 mL Intravenous PRN Befekadu, Belayenh, MD      . 0.9 %  sodium chloride infusion  100 mL Intravenous PRN Befekadu, Belayenh, MD      . 0.9 %  sodium chloride infusion  100 mL Intravenous PRN Fran Lowes, MD      . acetaminophen (TYLENOL) tablet 650 mg  650 mg Oral Q6H PRN Reubin Milan, MD       Or  . acetaminophen (TYLENOL) suppository 650 mg  650 mg Rectal Q6H PRN Reubin Milan, MD      . aspirin EC tablet 81 mg  81 mg Oral Daily Dhungel, Nishant, MD   81 mg at 10/08/17 1531  . benztropine (COGENTIN) tablet 1 mg  1 mg Oral Daily Dhungel, Nishant, MD   1 mg at 10/08/17 1531  . buPROPion (WELLBUTRIN XL) 24 hr tablet 300 mg  300 mg Oral Daily Dhungel, Nishant, MD  300 mg at 10/08/17 1531  . ceFAZolin (ANCEF) IVPB 2g/100 mL premix  2 g Intravenous Once Sandi Mariscal, MD      . Chlorhexidine Gluconate Cloth 2 % PADS 6 each  6 each Topical Q0600 Fran Lowes, MD   6 each at 10/08/17 0552  . divalproex (DEPAKOTE) DR tablet 500 mg  500 mg Oral QHS Dhungel, Nishant, MD   500 mg at 10/08/17 2253  . fluPHENAZine (PROLIXIN) tablet 5 mg  5 mg Oral Daily Dhungel, Nishant, MD   5 mg at 10/08/17 1531  . heparin injection 1,000 Units  1,000 Units Dialysis PRN Fran Lowes, MD   1,000 Units at 10/06/17 1637  . hydrALAZINE (APRESOLINE) injection 10 mg  10 mg Intravenous Q4H PRN Reubin Milan, MD   10 mg  at 10/08/17 2800  . insulin aspart (novoLOG) injection 0-9 Units  0-9 Units Subcutaneous TID WC Reubin Milan, MD   1 Units at 10/09/17 0804  . insulin glargine (LANTUS) injection 5 Units  5 Units Subcutaneous QHS Reubin Milan, MD   5 Units at 10/08/17 2103  . latanoprost (XALATAN) 0.005 % ophthalmic solution 1 drop  1 drop Both Eyes QHS Reubin Milan, MD   1 drop at 10/05/17 3491  . lidocaine (PF) (XYLOCAINE) 1 % injection 5 mL  5 mL Intradermal PRN Fran Lowes, MD      . lidocaine-prilocaine (EMLA) cream 1 application  1 application Topical PRN Fran Lowes, MD      . multivitamin with minerals tablet 1 tablet  1 tablet Oral Daily Dhungel, Nishant, MD   1 tablet at 10/08/17 1531  . ondansetron (ZOFRAN) tablet 4 mg  4 mg Oral Q6H PRN Reubin Milan, MD   4 mg at 10/06/17 2118   Or  . ondansetron [facility]) injection 4 mg  4 mg Intravenous Q6H PRN Reubin Milan, MD      . pentafluoroprop-tetrafluoroeth [facility]) aerosol 1 application  1 application Topical PRN Fran Lowes, MD      . simvastatin (ZOCOR) tablet 40 mg  40 mg Oral Daily Dhungel, Nishant, MD   40 mg at 10/08/17 1531  . sodium bicarbonate injection 50 mEq  50 mEq Intravenous Once Reubin Milan, MD         Discharge Medications: Please see discharge summary for a list of discharge medications.  Relevant Imaging Results:  Relevant Lab Results:   Additional Information SSN: [id number] [place]

## 2017-10-09 NOTE — Progress Notes (Signed)
PROGRESS NOTE                                                                                                                                                                                                             Patient Demographics:    [name], is a 57 y.o. female, DOB - 10-19-1960, BOE:[id number]  Admit date - 10/04/2017   Admitting Physician Reubin Milan, MD  Outpatient Primary MD for the patient is Patient, No Pcp Per  LOS - 4  Outpatient Specialists: Nephrology  Chief Complaint  Patient presents with  . 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  Admitted for further management.    Subjective:   No overnight issues.  Received hemodialysis after tunneled dialysis catheter yesterday.   Assessment  & Plan :    Principal Problem: Acute metabolic encephalopathy Suspect this is uremic with combination of her underlying bipolar disorder and schizophrenia.  She is also on multiple medications including Cogentin, Wellbutrin, Depakote, Prolixin, Ativan and Lyrica.  I will resume all of them except for Ativan and Lyrica. No signs of infection.  B12 was normal.  TSH with low normal free T4.  MRI of the brain shows generalized atrophy.  Mental status appears to be at baseline now.  Active Problems: Bipolar disorder/schizophrenia Resident of group home.  Not sure what her baseline mental status is.  Home meds have been resumed except for Ativan and Lyrica.   ESRD (end stage renal disease) ([place]) Thrombosed AV graft.  Tunneled dialysis catheter placed by IR on 6/20.  Received hemodialysis after that.   Nephrology following.   Diabetes mellitus type 2 CBG stable.  Continue Lantus with  sliding scale coverage.     Hyperkalemia Resolved with dialysis.    Anemia in ESRD (end-stage renal disease) ([place]) Stable.   Essential hypertension Stable.  Continue home meds      Code Status : full code  Family Communication  : none at bedside  Disposition Plan  : PT recommends assisted living.  Awaiting placement.  Barriers For Discharge : Awaiting assisted living  Consults  : Renal, surgery, IR  Procedures  : Right femoral catheter, MRI brain  DVT Prophylaxis  : Subcu heparin  Lab Results  Component Value Date   PLT 146 (L) 10/06/2017    Antibiotics  :    Anti-infectives (From admission, onward)   Start     Dose/Rate Route Frequency Ordered Stop   10/08/17 1400  ceFAZolin (ANCEF) IVPB 2g/100 mL premix  Status:  Discontinued     2 g 200 mL/hr over 30 Minutes Intravenous Every 8 hours 10/08/17 1224 10/08/17 1225   10/08/17 1230  ceFAZolin (ANCEF) IVPB 2g/100 mL premix     2 g 200 mL/hr over 30 Minutes Intravenous  Once 10/08/17 1225          Objective:   Vitals:   10/08/17 2205 10/09/17 0620 10/09/17 1311 10/09/17 1447  BP: 119/73 129/85 (!) 152/73 (!) 157/89  Pulse: 68 73 75 73  Resp: _0 Temp: 98.5 F (36.9 C) 98.8 F (37.1 C) 98.3 F (36.8 C)   TempSrc: Oral Oral Oral   SpO2: 100% 100% 100% 100%  Weight:      Height:        Wt Readings from Last 3 Encounters:  10/08/17 67.8 kg (149 lb 7.6 oz)  10/24/16 71.5 kg (157 lb 10.1 oz)     Intake/Output Summary (Last 24 hours) at 10/09/2017 1502 Last data filed at 10/09/2017 1300 Gross per 24 hour  Intake 960 ml  Output 493 ml  Net 467 ml   Physical exam  not in distress HEENT: Moist mucosa, supple neck Chest: Clear bilaterally, tunneled dialysis catheter CVS: Normal S1 and S2, no murmurs GI: Soft, nondistended, nontender Musculoskeletal: Warm, no edema CNS: Alert and oriented x2    Data Review:    CBC Recent Labs  Lab 10/04/17 1819 10/05/17 0428 10/06/17 0422  WBC  5.2 5.6 9.6  HGB 10.8* 11.6* 13.1  HCT 33.9* 35.6* 40.9  PLT 205 166 146*  MCV 92.4 92.0 93.4  MCH 29.4 30.0 29.9  MCHC 31.9 32.6 32.0  RDW 15.3 15.4 15.6*  LYMPHSABS 1.6  --   --   MONOABS 0.5  --   --   EOSABS 0.2  --   --   BASOSABS 0.0  --   --     Chemistries  Recent Labs  Lab 10/04/17 1819 10/04/17 2320 10/05/17 0428 10/06/17 0422 10/07/17 0555  NA 132* 134* 138 137 138  K 6.1* 5.9* 5.7* 6.5* 4.4  CL 96* 97* 103 103 100*  CO2 _1 GLUCOSE 328* 298* 228* 198* 121*  BUN 65* 65* 69* 52* 45*  CREATININE 9.36* 9.36* 9.39* 7.85* 8.26*  CALCIUM 9.6 10.0 9.8 9.7 9.3  AST  --   --  16  --   --   ALT  --   --  14  --   --   ALKPHOS  --   --  96  --   --   BILITOT  --   --  0.5  --   --    ------------------------------------------------------------------------------------------------------------------ No results for input(s): CHOL, HDL, LDLCALC, TRIG, CHOLHDL, LDLDIRECT in the last 72 hours.  Lab Results  Component Value Date   HGBA1C 6.5 (H) 10/21/2016   ------------------------------------------------------------------------------------------------------------------ Recent Labs    10/07/17 0906 10/08/17 1708  TSH 5.876*  --   T3FREE  --  1.6*   ------------------------------------------------------------------------------------------------------------------ Recent Labs    10/07/17 0759  VITAMINB12 985*    Coagulation profile Recent Labs  Lab 10/05/17 1009  INR 1.08    No results for input(s): DDIMER in the last 72 hours.  Cardiac Enzymes No results for input(s): CKMB, TROPONINI, MYOGLOBIN in the last 168 hours.  Invalid input(s): CK ------------------------------------------------------------------------------------------------------------------ No results found for: BNP  Inpatient Medications  Scheduled Meds: . aspirin EC  81 mg Oral Daily  . benztropine  1 mg Oral Daily  . buPROPion  300 mg Oral Daily  . Chlorhexidine Gluconate  Cloth  6 each Topical Q0600  . divalproex  500 mg Oral QHS  . fluPHENAZine  5 mg Oral Daily  . insulin aspart  0-9 Units Subcutaneous TID WC  . insulin glargine  5 Units Subcutaneous QHS  . latanoprost  1 drop Both Eyes QHS  . multivitamin with minerals  1 tablet Oral Daily  . simvastatin  40 mg Oral Daily  . sodium bicarbonate  50 mEq Intravenous Once   Continuous Infusions: . sodium chloride    . sodium chloride    . sodium chloride    . sodium chloride    .  ceFAZolin (ANCEF) IV     PRN Meds:.sodium chloride, sodium chloride, sodium chloride, sodium chloride, acetaminophen **OR** acetaminophen, heparin, hydrALAZINE, lidocaine (PF), lidocaine-prilocaine, ondansetron **OR** ondansetron (ZOFRAN) IV, pentafluoroprop-tetrafluoroeth  Micro Results Recent Results (from the past 240 hour(s))  MRSA PCR Screening     Status: None   Collection Time: 10/05/17 12:44 AM  Result Value Ref Range Status   MRSA by PCR NEGATIVE NEGATIVE Final    Comment:        The GeneXpert MRSA Assay (FDA approved for NASAL specimens only), is one component of a comprehensive MRSA colonization surveillance program. It is not intended to diagnose MRSA infection nor to guide or monitor treatment for MRSA infections. Performed at [facility], [address] [place], [place] [address]     Radiology Reports Dg Chest 1 View  Result Date: 10/04/2017 CLINICAL DATA:  Patient fell today. Right leg pain. Sleeping more than usual. EXAM: CHEST  1 VIEW COMPARISON:  10/21/2016 FINDINGS: Right central venous catheter with tip over the right atrium. Additional catheter fragment demonstrated in the right axillary region. Shallow inspiration with elevation of the right hemidiaphragm. Cardiac enlargement with mild vascular congestion. No edema or consolidation. Linear atelectasis in the lung bases. No blunting of costophrenic angles. No pneumothorax. Degenerative changes in the shoulders. IMPRESSION: Shallow inspiration  with linear atelectasis in the lung bases. Cardiac enlargement with mild vascular congestion. No edema or consolidation. Electronically Signed   By: Lucienne Capers M.D.   On: 10/04/2017 21:05   Dg Pelvis 1-2 Views  Result Date: 10/04/2017 CLINICAL DATA:  Patient fell today. Right leg pain. Sleeping more than usual. EXAM: PELVIS - 1-2 VIEW COMPARISON:  None. FINDINGS: Mild degenerative changes in the hips. There is no evidence of pelvic fracture or diastasis. No pelvic bone lesions are seen. Vascular calcifications. Calcified phleboliths in the pelvis. IMPRESSION: No acute bony abnormalities identified. Electronically Signed   By: Lucienne Capers M.D.   On: 10/04/2017 21:04   Ct Head Wo Contrast  Result Date: 10/04/2017 CLINICAL DATA:  Altered mental status. EXAM: CT HEAD WITHOUT CONTRAST CT CERVICAL SPINE WITHOUT CONTRAST TECHNIQUE: Multidetector CT imaging of the head and cervical spine was performed following the standard protocol without intravenous contrast. Multiplanar CT image reconstructions of the cervical spine were also generated. COMPARISON:  Head CT dated 10/21/2016. FINDINGS: CT HEAD FINDINGS Brain: Again noted is generalized parenchymal atrophy with commensurate dilatation of the  ventricles and sulci. Ventricles are stable in size. There is no mass, hemorrhage, edema or other evidence of acute parenchymal abnormality. No extra-axial hemorrhage. Vascular: There are chronic calcified atherosclerotic changes of the large vessels at the skull base. No unexpected hyperdense vessel. Skull: Normal. Negative for fracture or focal lesion. Sinuses/Orbits: No acute finding. Other: None. CT CERVICAL SPINE FINDINGS Alignment: Mild scoliosis at the cervicothoracic junction. No evidence of acute vertebral body subluxation. Skull base and vertebrae: No fracture line or displaced fracture fragment seen. No acute or suspicious osseous lesion. Soft tissues and spinal canal: No prevertebral fluid or swelling.  No visible canal hematoma. Disc levels: Mild disc desiccation at C5-6. No significant central canal stenosis at any level. Upper chest: No acute or significant findings. Other: Bilateral carotid atherosclerosis. IMPRESSION: 1. No acute intracranial abnormality. No intracranial mass, hemorrhage or edema. 2. No acute or significant findings within the cervical spine. Mild degenerative change at the C5-6 level. 3. Carotid atherosclerosis. Electronically Signed   By: Franki Cabot M.D.   On: 10/04/2017 20:57   Ct Cervical Spine Wo Contrast  Result Date: 10/04/2017 CLINICAL DATA:  Altered mental status. EXAM: CT HEAD WITHOUT CONTRAST CT CERVICAL SPINE WITHOUT CONTRAST TECHNIQUE: Multidetector CT imaging of the head and cervical spine was performed following the standard protocol without intravenous contrast. Multiplanar CT image reconstructions of the cervical spine were also generated. COMPARISON:  Head CT dated 10/21/2016. FINDINGS: CT HEAD FINDINGS Brain: Again noted is generalized parenchymal atrophy with commensurate dilatation of the ventricles and sulci. Ventricles are stable in size. There is no mass, hemorrhage, edema or other evidence of acute parenchymal abnormality. No extra-axial hemorrhage. Vascular: There are chronic calcified atherosclerotic changes of the large vessels at the skull base. No unexpected hyperdense vessel. Skull: Normal. Negative for fracture or focal lesion. Sinuses/Orbits: No acute finding. Other: None. CT CERVICAL SPINE FINDINGS Alignment: Mild scoliosis at the cervicothoracic junction. No evidence of acute vertebral body subluxation. Skull base and vertebrae: No fracture line or displaced fracture fragment seen. No acute or suspicious osseous lesion. Soft tissues and spinal canal: No prevertebral fluid or swelling. No visible canal hematoma. Disc levels: Mild disc desiccation at C5-6. No significant central canal stenosis at any level. Upper chest: No acute or significant findings.  Other: Bilateral carotid atherosclerosis. IMPRESSION: 1. No acute intracranial abnormality. No intracranial mass, hemorrhage or edema. 2. No acute or significant findings within the cervical spine. Mild degenerative change at the C5-6 level. 3. Carotid atherosclerosis. Electronically Signed   By: Franki Cabot M.D.   On: 10/04/2017 20:57   Mr Brain Wo Contrast  Result Date: 10/06/2017 CLINICAL DATA:  Altered level of consciousness for 2 days EXAM: MRI HEAD WITHOUT CONTRAST TECHNIQUE: Multiplanar, multiecho pulse sequences of the brain and surrounding structures were obtained without intravenous contrast. COMPARISON:  Head CT from 2 days ago FINDINGS: Very motion degraded sagittal T1 and diffusion imaging was acquired. No detected infarct. There is atrophy with ventriculomegaly. No detected midline shift. The scan was terminated due to patient request. IMPRESSION: 1. Only severely motion degraded T1 and diffusion imaging could be acquired. 2. No detected infarct or other acute finding. 3. Generalized atrophy. Electronically Signed   By: Monte Fantasia M.D.   On: 10/06/2017 11:08   Ir Fluoro Guide Cv Line Left  Result Date: 10/08/2017 INDICATION: History of end-stage renal disease now with clotted dialysis graft. [facility] made for placement tunneled hemodialysis catheter. EXAM: TUNNELED CENTRAL VENOUS HEMODIALYSIS CATHETER PLACEMENT WITH ULTRASOUND AND FLUOROSCOPIC GUIDANCE MEDICATIONS:  Ancef 2 gm IV . The antibiotic was given in an appropriate time interval prior to skin puncture. ANESTHESIA/SEDATION: Versed 1 mg IV; Fentanyl 50 mcg IV; Moderate Sedation Time: 13 minutes The patient was continuously monitored during the procedure by the interventional radiology nurse under my direct supervision. FLUOROSCOPY TIME:  Fluoroscopy Time: 18 seconds (3 mGy). COMPLICATIONS: None immediate. PROCEDURE: Informed written consent was obtained from the patient's family after a discussion of the risks, benefits, and  alternatives to treatment. Questions regarding the procedure were encouraged and answered. The left neck and chest were prepped with chlorhexidine in a sterile fashion, and a sterile drape was applied covering the operative field. Maximum barrier sterile technique with sterile gowns and gloves were used for the procedure. A timeout was performed prior to the initiation of the procedure. After creating a small venotomy incision, a micropuncture kit was utilized to access the internal jugular vein. Real-time ultrasound guidance was utilized for vascular access including the acquisition of a permanent ultrasound image documenting patency of the accessed vessel. The microwire was utilized to measure appropriate catheter length. A stiff Glidewire was advanced to the level of the IVC and the micropuncture sheath was exchanged for a peel-away sheath. A palindrome tunneled hemodialysis catheter measuring 19 cm from tip to cuff was tunneled in a retrograde fashion from the anterior chest wall to the venotomy incision. The catheter was then placed through the peel-away sheath with tips ultimately positioned within the superior aspect of the right atrium. Final catheter positioning was confirmed and documented with a spot radiographic image. Note is made of an abandoned right chest wall hero graft. The catheter aspirates and flushes normally. The catheter was flushed with appropriate volume heparin dwells. The catheter exit site was secured with a 0-Prolene retention suture. The venotomy incision was closed with an interrupted 4-0 Vicryl, Dermabond and Steri-strips. Dressings were applied. The patient tolerated the procedure well without immediate post procedural complication. IMPRESSION: Successful placement of 19 cm tip to cuff tunneled hemodialysis catheter via the left internal jugular vein with tips terminating within the superior aspect of the right atrium. The catheter is ready for immediate use. Electronically Signed    By: Sandi Mariscal M.D.   On: 10/08/2017 13:11   Ir US Guide Vasc Access Left  Result Date: 10/08/2017 INDICATION: History of end-stage renal disease now with clotted dialysis graft. [facility] made for placement tunneled hemodialysis catheter. EXAM: TUNNELED CENTRAL VENOUS HEMODIALYSIS CATHETER PLACEMENT WITH ULTRASOUND AND FLUOROSCOPIC GUIDANCE MEDICATIONS: Ancef 2 gm IV . The antibiotic was given in an appropriate time interval prior to skin puncture. ANESTHESIA/SEDATION: Versed 1 mg IV; Fentanyl 50 mcg IV; Moderate Sedation Time: 13 minutes The patient was continuously monitored during the procedure by the interventional radiology nurse under my direct supervision. FLUOROSCOPY TIME:  Fluoroscopy Time: 18 seconds (3 mGy). COMPLICATIONS: None immediate. PROCEDURE: Informed written consent was obtained from the patient's family after a discussion of the risks, benefits, and alternatives to treatment. Questions regarding the procedure were encouraged and answered. The left neck and chest were prepped with chlorhexidine in a sterile fashion, and a sterile drape was applied covering the operative field. Maximum barrier sterile technique with sterile gowns and gloves were used for the procedure. A timeout was performed prior to the initiation of the procedure. After creating a small venotomy incision, a micropuncture kit was utilized to access the internal jugular vein. Real-time ultrasound guidance was utilized for vascular access including the acquisition of a permanent ultrasound image documenting patency of  the accessed vessel. The microwire was utilized to measure appropriate catheter length. A stiff Glidewire was advanced to the level of the IVC and the micropuncture sheath was exchanged for a peel-away sheath. A palindrome tunneled hemodialysis catheter measuring 19 cm from tip to cuff was tunneled in a retrograde fashion from the anterior chest wall to the venotomy incision. The catheter was then placed through the  peel-away sheath with tips ultimately positioned within the superior aspect of the right atrium. Final catheter positioning was confirmed and documented with a spot radiographic image. Note is made of an abandoned right chest wall hero graft. The catheter aspirates and flushes normally. The catheter was flushed with appropriate volume heparin dwells. The catheter exit site was secured with a 0-Prolene retention suture. The venotomy incision was closed with an interrupted 4-0 Vicryl, Dermabond and Steri-strips. Dressings were applied. The patient tolerated the procedure well without immediate post procedural complication. IMPRESSION: Successful placement of 19 cm tip to cuff tunneled hemodialysis catheter via the left internal jugular vein with tips terminating within the superior aspect of the right atrium. The catheter is ready for immediate use. Electronically Signed   By: Sandi Mariscal M.D.   On: 10/08/2017 13:11   Dg Femur Portable Min 2 Views Right  Result Date: 10/04/2017 CLINICAL DATA:  Per ED notes: [facility] reports that pt fell today, pt from [facility] and that pt had some right leg pain, pt denies pain at present. Reported that has been sleeping more than usual. EXAM: RIGHT FEMUR PORTABLE 2 VIEW COMPARISON:  None. FINDINGS: No fracture RIGHT femur. No dislocation. Atherosclerotic calcification noted. IMPRESSION: No fracture or dislocation. Electronically Signed   By: Suzy Bouchard M.D.   On: 10/04/2017 21:02    Time Spent in minutes  25   Stiven Kaspar M.D on 10/09/2017 at 3:02 PM  Between 7am to 7pm - Pager - [phone]  After 7pm go to [email or web address] - password [facility]  [facility] -  Office  [phone]

## 2017-10-09 NOTE — Plan of Care (Signed)
  Problem: Acute Rehab PT Goals(only PT should resolve) Goal: Pt Will Go Supine/Side To Sit Outcome: Progressing Flowsheets (Taken 10/09/2017 1037) Pt will go Supine/Side to Sit: Independently Goal: Patient Will Transfer Sit To/From Stand Outcome: Progressing Flowsheets (Taken 10/09/2017 1037) Patient will transfer sit to/from stand: Independently Goal: Pt Will Transfer Bed To Chair/Chair To Bed Outcome: Progressing Flowsheets (Taken 10/09/2017 1037) Pt will Transfer Bed to Chair/Chair to Bed: with modified independence Goal: Pt Will Ambulate Outcome: Progressing Flowsheets (Taken 10/09/2017 1037) Pt will Ambulate: with supervision;100 feet;with rolling walker   10:38 AM, 10/09/17 Lonell Grandchild, MPT Physical Therapist with [facility] [phone] office [phone] mobile phone

## 2017-10-09 NOTE — Progress Notes (Signed)
Received verbal order from Dr. Lowanda Foster for trained nurse to removed temporary femoral dialysis catheter today, since we have Left chest dialysis catheter access now.

## 2017-10-09 NOTE — Evaluation (Signed)
Physical Therapy Evaluation Patient Details Name: [name] MRN: [id number] DOB: Sep 07, 1960 Today's Date: 10/09/2017   History of Present Illness  [name] is a 57 y.o. female with medical history significant of bipolar 1 disorder, schizophrenia, hypertension, type 2 diabetes who is brought to the emergency department from the [facility] family care home due to altered mental status.  She was seen recently, on Friday, at [facility] for AV fistula thrombosis.  She was reported to be acting normally Saturday and early Sunday morning.  She had a fall earlier today.  No further history is available at this time.     Clinical Impression  Patient functioning near baseline for functional mobility and gait, slightly unsteady with occasional drifting to left/right without loss of balance.  Patient tolerated sitting up in chair after therapy - RN notified.  Patient will benefit from continued physical therapy in hospital to increase strength, balance, endurance for safe ADLs and gait.     Follow Up Recommendations Supervision/Assistance - 24 hour    Equipment Recommendations  Rolling walker with 5" wheels    Recommendations for Other Services       Precautions / Restrictions Precautions Precautions: Fall Restrictions Weight Bearing Restrictions: No      Mobility  Bed Mobility Overal bed mobility: Modified Independent                Transfers Overall transfer level: Needs assistance Equipment used: Rolling walker (2 wheeled);None Transfers: Sit to/from [facility] Sit to Stand: Supervision Stand pivot transfers: Supervision       General transfer comment: with or without use of RW  Ambulation/Gait Ambulation/Gait assistance: Min guard Gait Distance (Feet): 75 Feet Assistive device: Rolling walker (2 wheeled) Gait Pattern/deviations: Decreased step length - right;Decreased step length - left;Decreased stride length Gait velocity: decreased    General Gait Details: slightly unsteady labored cadence with tendency to reach for siderails, nearby objects for support when not using RW, no loss of balance  Stairs            Wheelchair Mobility    Modified Rankin (Stroke Patients Only)       Balance Overall balance assessment: Mild deficits observed, not formally tested                                           Pertinent Vitals/Pain Pain Assessment: No/denies pain    Home Living Family/patient expects to be discharged to:: Group home Living Arrangements: Group Home                    Prior Function Level of Independence: Needs assistance   Gait / Transfers Assistance Needed: supervised household gait without using an AD  ADL's / Homemaking Assistance Needed: group home staff assist        Hand Dominance        Extremity/Trunk Assessment   Upper Extremity Assessment Upper Extremity Assessment: Generalized weakness    Lower Extremity Assessment Lower Extremity Assessment: Generalized weakness    Cervical / Trunk Assessment Cervical / Trunk Assessment: Normal  Communication   Communication: No difficulties  Cognition Arousal/Alertness: Awake/alert Behavior During Therapy: WFL for tasks assessed/performed Overall Cognitive Status: Within Functional Limits for tasks assessed  General Comments      Exercises     Assessment/Plan    PT Assessment Patient needs continued PT services  PT Problem List Decreased strength;Decreased activity tolerance;Decreased balance;Decreased mobility       PT Treatment Interventions Gait training;Stair training;Functional mobility training;Therapeutic activities;Therapeutic exercise;Patient/family education    PT Goals (Current goals can be found in the Care Plan section)  Acute Rehab PT Goals Patient Stated Goal: return to a group home setting PT Goal Formulation: With  patient Time For Goal Achievement: 10/16/17 Potential to Achieve Goals: Good    Frequency Min 3X/week   Barriers to discharge        Co-evaluation               AM-PAC PT "6 Clicks" Daily Activity  Outcome Measure Difficulty turning over in bed (including adjusting bedclothes, sheets and blankets)?: None Difficulty moving from lying on back to sitting on the side of the bed? : None Difficulty sitting down on and standing up from a chair with arms (e.g., wheelchair, bedside commode, etc,.)?: None Help needed moving to and from a bed to chair (including a wheelchair)?: None Help needed walking in hospital room?: A Little Help needed climbing 3-5 steps with a railing? : A Little 6 Click Score: 22    End of Session Equipment Utilized During Treatment: Gait belt Activity Tolerance: Patient tolerated treatment well;Patient limited by fatigue Patient left: in chair;with call bell/phone within reach;with chair alarm set Nurse Communication: Mobility status PT Visit Diagnosis: Unsteadiness on feet (R26.81);Other abnormalities of gait and mobility (R26.89);Muscle weakness (generalized) (M62.81)    Time: 2395-3202 PT Time Calculation (min) (ACUTE ONLY): 23 min   Charges:   PT Evaluation $PT Eval Moderate Complexity: 1 Mod PT Treatments $Therapeutic Activity: 23-37 mins   PT G Codes:        10:36 AM, 23-Oct-2017 Lonell Grandchild, MPT Physical Therapist with [facility] [phone] office [phone] mobile phone

## 2017-10-09 NOTE — Care Management (Signed)
Per PT pt needs RW. Order placed and referral given to [facility], [facility] rep, who will deliver RW to pt room prior to DC.

## 2017-10-09 NOTE — Progress Notes (Signed)
Subjective: Interval History: Patient feels better. Denies any nausea or vomiting. Denies any difficulty in breathing  Objective: Vital signs in last 24 hours: Temp:  [97.9 F (36.6 C)-98.8 F (37.1 C)] 98.8 F (37.1 C) (06/21 0620) Pulse Rate:  [68-83] 73 (06/21 0620) Resp:  [14-20] 16 (06/21 0620) BP: (81-151)/(40-91) 129/85 (06/21 0620) SpO2:  [97 %-100 %] 100 % (06/21 0620) Weight:  [67.8 kg (149 lb 7.6 oz)] 67.8 kg (149 lb 7.6 oz) (06/20 1725) Weight change:   Intake/Output from previous day: 06/20 0701 - 06/21 0700 In: 480 [P.O.:480] Out: 493  Intake/Output this shift: No intake/output data recorded.   generally : Patient is alert and sitting on a chair.  In n this is an 57 year old o apparent distress. Chest is clear to auscultation Heart exam revealed regular rate and rhythm no murmur no S3 Extremities no edema  Lab Results: No results for input(s): WBC, HGB, HCT, PLT in the last 72 hours. BMET:  Recent Labs    10/07/17 0555  NA 138  K 4.4  CL 100*  CO2 27  GLUCOSE 121*  BUN 45*  CREATININE 8.26*  CALCIUM 9.3   No results for input(s): PTH in the last 72 hours. Iron Studies: No results for input(s): IRON, TIBC, TRANSFERRIN, FERRITIN in the last 72 hours.  Studies/Results: Ir Fluoro Guide Cv Line Left  Result Date: 10/08/2017 INDICATION: History of end-stage renal disease now with clotted dialysis graft. [facility] made for placement tunneled hemodialysis catheter. EXAM: TUNNELED CENTRAL VENOUS HEMODIALYSIS CATHETER PLACEMENT WITH ULTRASOUND AND FLUOROSCOPIC GUIDANCE MEDICATIONS: Ancef 2 gm IV . The antibiotic was given in an appropriate time interval prior to skin puncture. ANESTHESIA/SEDATION: Versed 1 mg IV; Fentanyl 50 mcg IV; Moderate Sedation Time: 13 minutes The patient was continuously monitored during the procedure by the interventional radiology nurse under my direct supervision. FLUOROSCOPY TIME:  Fluoroscopy Time: 18 seconds (3 mGy). COMPLICATIONS: None  immediate. PROCEDURE: Informed written consent was obtained from the patient's family after a discussion of the risks, benefits, and alternatives to treatment. Questions regarding the procedure were encouraged and answered. The left neck and chest were prepped with chlorhexidine in a sterile fashion, and a sterile drape was applied covering the operative field. Maximum barrier sterile technique with sterile gowns and gloves were used for the procedure. A timeout was performed prior to the initiation of the procedure. After creating a small venotomy incision, a micropuncture kit was utilized to access the internal jugular vein. Real-time ultrasound guidance was utilized for vascular access including the acquisition of a permanent ultrasound image documenting patency of the accessed vessel. The microwire was utilized to measure appropriate catheter length. A stiff Glidewire was advanced to the level of the IVC and the micropuncture sheath was exchanged for a peel-away sheath. A palindrome tunneled hemodialysis catheter measuring 19 cm from tip to cuff was tunneled in a retrograde fashion from the anterior chest wall to the venotomy incision. The catheter was then placed through the peel-away sheath with tips ultimately positioned within the superior aspect of the right atrium. Final catheter positioning was confirmed and documented with a spot radiographic image. Note is made of an abandoned right chest wall hero graft. The catheter aspirates and flushes normally. The catheter was flushed with appropriate volume heparin dwells. The catheter exit site was secured with a 0-Prolene retention suture. The venotomy incision was closed with an interrupted 4-0 Vicryl, Dermabond and Steri-strips. Dressings were applied. The patient tolerated the procedure well without immediate post procedural complication.  IMPRESSION: Successful placement of 19 cm tip to cuff tunneled hemodialysis catheter via the left internal jugular vein  with tips terminating within the superior aspect of the right atrium. The catheter is ready for immediate use. Electronically Signed   By: Sandi Mariscal M.D.   On: 10/08/2017 13:11   Ir US Guide Vasc Access Left  Result Date: 10/08/2017 INDICATION: History of end-stage renal disease now with clotted dialysis graft. [facility] made for placement tunneled hemodialysis catheter. EXAM: TUNNELED CENTRAL VENOUS HEMODIALYSIS CATHETER PLACEMENT WITH ULTRASOUND AND FLUOROSCOPIC GUIDANCE MEDICATIONS: Ancef 2 gm IV . The antibiotic was given in an appropriate time interval prior to skin puncture. ANESTHESIA/SEDATION: Versed 1 mg IV; Fentanyl 50 mcg IV; Moderate Sedation Time: 13 minutes The patient was continuously monitored during the procedure by the interventional radiology nurse under my direct supervision. FLUOROSCOPY TIME:  Fluoroscopy Time: 18 seconds (3 mGy). COMPLICATIONS: None immediate. PROCEDURE: Informed written consent was obtained from the patient's family after a discussion of the risks, benefits, and alternatives to treatment. Questions regarding the procedure were encouraged and answered. The left neck and chest were prepped with chlorhexidine in a sterile fashion, and a sterile drape was applied covering the operative field. Maximum barrier sterile technique with sterile gowns and gloves were used for the procedure. A timeout was performed prior to the initiation of the procedure. After creating a small venotomy incision, a micropuncture kit was utilized to access the internal jugular vein. Real-time ultrasound guidance was utilized for vascular access including the acquisition of a permanent ultrasound image documenting patency of the accessed vessel. The microwire was utilized to measure appropriate catheter length. A stiff Glidewire was advanced to the level of the IVC and the micropuncture sheath was exchanged for a peel-away sheath. A palindrome tunneled hemodialysis catheter measuring 19 cm from tip to  cuff was tunneled in a retrograde fashion from the anterior chest wall to the venotomy incision. The catheter was then placed through the peel-away sheath with tips ultimately positioned within the superior aspect of the right atrium. Final catheter positioning was confirmed and documented with a spot radiographic image. Note is made of an abandoned right chest wall hero graft. The catheter aspirates and flushes normally. The catheter was flushed with appropriate volume heparin dwells. The catheter exit site was secured with a 0-Prolene retention suture. The venotomy incision was closed with an interrupted 4-0 Vicryl, Dermabond and Steri-strips. Dressings were applied. The patient tolerated the procedure well without immediate post procedural complication. IMPRESSION: Successful placement of 19 cm tip to cuff tunneled hemodialysis catheter via the left internal jugular vein with tips terminating within the superior aspect of the right atrium. The catheter is ready for immediate use. Electronically Signed   By: Sandi Mariscal M.D.   On: 10/08/2017 13:11    I have reviewed the patient's current medications.  Assessment/Plan: 1] altered mental status:  patient has significantly improved.  Presently she is alert and answering question. 2] end-stage renal disease: She is status post hemodialysis yesterday.  She denies any nausea or vomiting.  3] anemia: Her hemoglobin is above our target goal.  Epogen is on hold. 4] bone and mineral disorder: Her calcium and phosphorus is a range 5] history of hypertension: Her blood pressure is reasonably controlled  6] history of diabetes 7] fluid management: Does not have any sign of fluid overload.    Plan: 1]. Patient does not require dialysis today  We will dialyze patient tomorrow for 31/2 hours. 2] we will use 2K  bath 3] we will check her CBC and renal panel in the morning.   LOS: 4 days   Dameshia Seybold S 10/09/2017,8:12 AM

## 2017-10-10 LAB — RENAL FUNCTION PANEL
ALBUMIN: 3.1 g/dL — AB (ref 3.5–5.0)
Anion gap: 15 (ref 5–15)
BUN: 35 mg/dL — AB (ref 6–20)
CO2: 26 mmol/L (ref 22–32)
CREATININE: 6.82 mg/dL — AB (ref 0.44–1.00)
Calcium: 9.2 mg/dL (ref 8.9–10.3)
Chloride: 93 mmol/L — ABNORMAL LOW (ref 101–111)
GFR calc Af Amer: 7 mL/min — ABNORMAL LOW (ref >60)
GFR, EST NON AFRICAN AMERICAN: 6 mL/min — AB (ref >60)
Glucose, Bld: 185 mg/dL — ABNORMAL HIGH (ref 65–99)
PHOSPHORUS: 5.8 mg/dL — AB (ref 2.5–4.6)
Potassium: 3.5 mmol/L (ref 3.5–5.1)
SODIUM: 134 mmol/L — AB (ref 135–145)

## 2017-10-10 LAB — CBC
HCT: 33 % — ABNORMAL LOW (ref 36.0–46.0)
Hemoglobin: 10.8 g/dL — ABNORMAL LOW (ref 12.0–15.0)
MCH: 29.5 pg (ref 26.0–34.0)
MCHC: 32.7 g/dL (ref 30.0–36.0)
MCV: 90.2 fL (ref 78.0–100.0)
PLATELETS: 145 10*3/uL — AB (ref 150–400)
RBC: 3.66 MIL/uL — ABNORMAL LOW (ref 3.87–5.11)
RDW: 15.4 % (ref 11.5–15.5)
WBC: 5.8 10*3/uL (ref 4.0–10.5)

## 2017-10-10 LAB — GLUCOSE, CAPILLARY
GLUCOSE-CAPILLARY: 191 mg/dL — AB (ref 65–99)
Glucose-Capillary: 128 mg/dL — ABNORMAL HIGH (ref 65–99)
Glucose-Capillary: 96 mg/dL (ref 65–99)
Glucose-Capillary: 193 mg/dL — ABNORMAL HIGH (ref 65–99)

## 2017-10-10 MED ORDER — HEPARIN SODIUM (PORCINE) 1000 UNIT/ML IJ SOLN
INTRAMUSCULAR | Status: AC
  Administered 2017-10-10: 1400 [IU] via INTRAVENOUS_CENTRAL
  Filled 2017-10-10: qty 1

## 2017-10-10 MED ORDER — LEVOTHYROXINE SODIUM 25 MCG PO TABS
25.0000 ug | ORAL_TABLET | Freq: Every day | ORAL | Status: DC
  Administered 2017-10-10 – 2017-10-14 (×5): 25 ug via ORAL
  Filled 2017-10-10 (×5): qty 1

## 2017-10-10 MED ORDER — ALTEPLASE 2 MG IJ SOLR
2.0000 mg | Freq: Once | INTRAMUSCULAR | Status: DC | PRN
  Filled 2017-10-10: qty 2

## 2017-10-10 MED ORDER — HEPARIN SODIUM (PORCINE) 1000 UNIT/ML DIALYSIS
20.0000 [IU]/kg | INTRAMUSCULAR | Status: DC | PRN
  Administered 2017-10-10: 1400 [IU] via INTRAVENOUS_CENTRAL
  Filled 2017-10-10 (×2): qty 2

## 2017-10-10 NOTE — Plan of Care (Signed)
progressing 

## 2017-10-10 NOTE — Progress Notes (Signed)
Subjective: Interval History: She offers offers no complaint.. Denies any difficulty in breathing   Objective: Vital signs in last 24 hours: Temp:  [98.3 F (36.8 C)-98.6 F (37 C)] 98.6 F (37 C) (06/22 0525) Pulse Rate:  [69-84] 84 (06/22 0525) Resp:  [15-18] 16 (06/22 0525) BP: (140-174)/(68-89) 158/87 (06/22 0525) SpO2:  [100 %] 100 % (06/22 0525) Weight change:   Intake/Output from previous day: 06/21 0701 - 06/22 0700 In: 720 [P.O.:720] Out: -  Intake/Output this shift: No intake/output data recorded.   generally : Patient is alert and sitting on a chair.  In n this is an 57 year old o apparent distress. Chest is clear to auscultation Heart exam revealed regular rate and rhythm no murmur no S3 Extremities no edema  Lab Results: Recent Labs    10/10/17 0617  WBC 5.8  HGB 10.8*  HCT 33.0*  PLT 145*   BMET:  Recent Labs    10/10/17 0617  NA 134*  K 3.5  CL 93*  CO2 26  GLUCOSE 185*  BUN 35*  CREATININE 6.82*  CALCIUM 9.2   No results for input(s): PTH in the last 72 hours. Iron Studies: No results for input(s): IRON, TIBC, TRANSFERRIN, FERRITIN in the last 72 hours.  Studies/Results: Ir Fluoro Guide Cv Line Left  Result Date: 10/08/2017 INDICATION: History of end-stage renal disease now with clotted dialysis graft. [facility] made for placement tunneled hemodialysis catheter. EXAM: TUNNELED CENTRAL VENOUS HEMODIALYSIS CATHETER PLACEMENT WITH ULTRASOUND AND FLUOROSCOPIC GUIDANCE MEDICATIONS: Ancef 2 gm IV . The antibiotic was given in an appropriate time interval prior to skin puncture. ANESTHESIA/SEDATION: Versed 1 mg IV; Fentanyl 50 mcg IV; Moderate Sedation Time: 13 minutes The patient was continuously monitored during the procedure by the interventional radiology nurse under my direct supervision. FLUOROSCOPY TIME:  Fluoroscopy Time: 18 seconds (3 mGy). COMPLICATIONS: None immediate. PROCEDURE: Informed written consent was obtained from the patient's family  after a discussion of the risks, benefits, and alternatives to treatment. Questions regarding the procedure were encouraged and answered. The left neck and chest were prepped with chlorhexidine in a sterile fashion, and a sterile drape was applied covering the operative field. Maximum barrier sterile technique with sterile gowns and gloves were used for the procedure. A timeout was performed prior to the initiation of the procedure. After creating a small venotomy incision, a micropuncture kit was utilized to access the internal jugular vein. Real-time ultrasound guidance was utilized for vascular access including the acquisition of a permanent ultrasound image documenting patency of the accessed vessel. The microwire was utilized to measure appropriate catheter length. A stiff Glidewire was advanced to the level of the IVC and the micropuncture sheath was exchanged for a peel-away sheath. A palindrome tunneled hemodialysis catheter measuring 19 cm from tip to cuff was tunneled in a retrograde fashion from the anterior chest wall to the venotomy incision. The catheter was then placed through the peel-away sheath with tips ultimately positioned within the superior aspect of the right atrium. Final catheter positioning was confirmed and documented with a spot radiographic image. Note is made of an abandoned right chest wall hero graft. The catheter aspirates and flushes normally. The catheter was flushed with appropriate volume heparin dwells. The catheter exit site was secured with a 0-Prolene retention suture. The venotomy incision was closed with an interrupted 4-0 Vicryl, Dermabond and Steri-strips. Dressings were applied. The patient tolerated the procedure well without immediate post procedural complication. IMPRESSION: Successful placement of 19 cm tip to cuff tunneled hemodialysis  catheter via the left internal jugular vein with tips terminating within the superior aspect of the right atrium. The catheter is  ready for immediate use. Electronically Signed   By: Sandi Mariscal M.D.   On: 10/08/2017 13:11   Ir US Guide Vasc Access Left  Result Date: 10/08/2017 INDICATION: History of end-stage renal disease now with clotted dialysis graft. [facility] made for placement tunneled hemodialysis catheter. EXAM: TUNNELED CENTRAL VENOUS HEMODIALYSIS CATHETER PLACEMENT WITH ULTRASOUND AND FLUOROSCOPIC GUIDANCE MEDICATIONS: Ancef 2 gm IV . The antibiotic was given in an appropriate time interval prior to skin puncture. ANESTHESIA/SEDATION: Versed 1 mg IV; Fentanyl 50 mcg IV; Moderate Sedation Time: 13 minutes The patient was continuously monitored during the procedure by the interventional radiology nurse under my direct supervision. FLUOROSCOPY TIME:  Fluoroscopy Time: 18 seconds (3 mGy). COMPLICATIONS: None immediate. PROCEDURE: Informed written consent was obtained from the patient's family after a discussion of the risks, benefits, and alternatives to treatment. Questions regarding the procedure were encouraged and answered. The left neck and chest were prepped with chlorhexidine in a sterile fashion, and a sterile drape was applied covering the operative field. Maximum barrier sterile technique with sterile gowns and gloves were used for the procedure. A timeout was performed prior to the initiation of the procedure. After creating a small venotomy incision, a micropuncture kit was utilized to access the internal jugular vein. Real-time ultrasound guidance was utilized for vascular access including the acquisition of a permanent ultrasound image documenting patency of the accessed vessel. The microwire was utilized to measure appropriate catheter length. A stiff Glidewire was advanced to the level of the IVC and the micropuncture sheath was exchanged for a peel-away sheath. A palindrome tunneled hemodialysis catheter measuring 19 cm from tip to cuff was tunneled in a retrograde fashion from the anterior chest wall to the venotomy  incision. The catheter was then placed through the peel-away sheath with tips ultimately positioned within the superior aspect of the right atrium. Final catheter positioning was confirmed and documented with a spot radiographic image. Note is made of an abandoned right chest wall hero graft. The catheter aspirates and flushes normally. The catheter was flushed with appropriate volume heparin dwells. The catheter exit site was secured with a 0-Prolene retention suture. The venotomy incision was closed with an interrupted 4-0 Vicryl, Dermabond and Steri-strips. Dressings were applied. The patient tolerated the procedure well without immediate post procedural complication. IMPRESSION: Successful placement of 19 cm tip to cuff tunneled hemodialysis catheter via the left internal jugular vein with tips terminating within the superior aspect of the right atrium. The catheter is ready for immediate use. Electronically Signed   By: Sandi Mariscal M.D.   On: 10/08/2017 13:11    I have reviewed the patient's current medications.  Assessment/Plan: 1] altered mental status:  patient is alert in no apparent distress.  Her mental status seems to have improved.  At this moment no sure what her baseline is. 2] end-stage renal disease: She is status post hemodialysis on Thursday.  Patient does not have any nausea or vomiting.  Her potassium is 3.5 3] anemia: Her hemoglobin has come down to our target goal. 4] bone and mineral disorder: Her calcium and phosphorus is a range 5] history of hypertension: Her blood pressure is reasonably controlled  6] history of diabetes: Her blood sugar is slightly high but overall seems to be controlled. 7] fluid management: Does not have any sign of fluid overload.    Plan: 1].  We will dialyze patient today for 31/2 hours. 2] we will use 2K bath 3] we will check her CBC and renal panel in the morning.   LOS: 5 days   Donja Tipping S 10/10/2017,8:18 AM

## 2017-10-10 NOTE — Procedures (Signed)
     HEMODIALYSIS TREATMENT NOTE:    3.5 hour low-heparin dialysis completed via left IJ tunneled catheter. Exit site unremarkable. Goal NOT met: Unable to tolerate removal of 1.5L as ordered.  UF was interrupted for last 1.5H of treatment due to hypotension.  Net UF 500cc.  All blood was returned.  Report given to Threasa Alpha, RN.   Rockwell Alexandria, RN, CDN

## 2017-10-10 NOTE — Progress Notes (Signed)
PROGRESS NOTE                                                                                                                                                                                                             Patient Demographics:    [name], is a 57 y.o. female, DOB - Jul 11, 1960, XLK:[id number]  Admit date - 10/04/2017   Admitting Physician Reubin Milan, MD  Outpatient Primary MD for the patient is Patient, No Pcp Per  LOS - 5  Outpatient Specialists: Nephrology  Chief Complaint  Patient presents with  . 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  Admitted for further management.    Subjective:   No overnight events.   Assessment  & Plan :    Principal Problem: Acute metabolic encephalopathy Suspect this is uremic with combination of her underlying bipolar disorder and schizophrenia.  She is also on multiple medications including Cogentin, Wellbutrin, Depakote, Prolixin, Ativan and Lyrica.  I will resume all of them except for Ativan and Lyrica. No signs of infection.  B12 was normal.  Elevated TSH with low normal free T4 and low T3.  We will start her on Synthroid.  MRI of the brain shows generalized atrophy.  Mental status appears to be at baseline now.  Active Problems: Bipolar disorder/schizophrenia Resident of group home.  Not sure what her baseline mental status is.  Home meds have been resumed except for Ativan and Lyrica.   ESRD (end stage renal disease) ([place]) Thrombosed AV graft.  Tunneled dialysis catheter placed by IR on 6/20.  Received hemodialysis after that.   Nephrology following.   Diabetes mellitus type 2 CBG stable.  Continue Lantus with sliding scale  coverage.     Hyperkalemia Resolved with dialysis.    Anemia in ESRD (end-stage renal disease) ([place]) Stable.   Essential hypertension Stable.  Continue home meds      Code Status : full code  Family Communication  : none at bedside  Disposition Plan  : PT recommends assisted living.  Awaiting placement.  Barriers For Discharge : Awaiting assisted living  Consults  :  Renal, surgery, IR  Procedures  : Right femoral catheter, MRI brain  DVT Prophylaxis  : Subcu heparin  Lab Results  Component Value Date   PLT 145 (L) 10/10/2017    Antibiotics  :    Anti-infectives (From admission, onward)   Start     Dose/Rate Route Frequency Ordered Stop   10/08/17 1400  ceFAZolin (ANCEF) IVPB 2g/100 mL premix  Status:  Discontinued     2 g 200 mL/hr over 30 Minutes Intravenous Every 8 hours 10/08/17 1224 10/08/17 1225   10/08/17 1230  ceFAZolin (ANCEF) IVPB 2g/100 mL premix     2 g 200 mL/hr over 30 Minutes Intravenous  Once 10/08/17 1225          Objective:   Vitals:   10/09/17 2134 10/10/17 0043 10/10/17 0126 10/10/17 0525  BP:  (!) 174/85 (!) 150/68 (!) 158/87  Pulse:  69 78 84  Resp:  15  16  Temp:  98.6 F (37 C)  98.6 F (37 C)  TempSrc:  Oral  Oral  SpO2: 100% 100%  100%  Weight:      Height:        Wt Readings from Last 3 Encounters:  10/08/17 67.8 kg (149 lb 7.6 oz)  10/24/16 71.5 kg (157 lb 10.1 oz)     Intake/Output Summary (Last 24 hours) at 10/10/2017 1026 Last data filed at 10/09/2017 1700 Gross per 24 hour  Intake 480 ml  Output -  Net 480 ml   Physical exam Not in distress HEENT: Moist mucosa, supple neck Chest: Clear bilaterally, tunneled dialysis catheter in place CVs: Normal S1 and S2 GI: Soft, nondistended, nontender Musculoskeletal: Warm, no edema    Data Review:    CBC Recent Labs  Lab 10/04/17 1819 10/05/17 0428 10/06/17 0422 10/10/17 0617  WBC 5.2 5.6 9.6 5.8  HGB 10.8* 11.6* 13.1 10.8*  HCT 33.9* 35.6* 40.9 33.0*    PLT 205 166 146* 145*  MCV 92.4 92.0 93.4 90.2  MCH 29.4 30.0 29.9 29.5  MCHC 31.9 32.6 32.0 32.7  RDW 15.3 15.4 15.6* 15.4  LYMPHSABS 1.6  --   --   --   MONOABS 0.5  --   --   --   EOSABS 0.2  --   --   --   BASOSABS 0.0  --   --   --     Chemistries  Recent Labs  Lab 10/04/17 2320 10/05/17 0428 10/06/17 0422 10/07/17 0555 10/10/17 0617  NA 134* 138 137 138 134*  K 5.9* 5.7* 6.5* 4.4 3.5  CL 97* 103 103 100* 93*  CO2 _0 GLUCOSE 298* 228* 198* 121* 185*  BUN 65* 69* 52* 45* 35*  CREATININE 9.36* 9.39* 7.85* 8.26* 6.82*  CALCIUM 10.0 9.8 9.7 9.3 9.2  AST  --  16  --   --   --   ALT  --  14  --   --   --   ALKPHOS  --  96  --   --   --   BILITOT  --  0.5  --   --   --    ------------------------------------------------------------------------------------------------------------------ No results for input(s): CHOL, HDL, LDLCALC, TRIG, CHOLHDL, LDLDIRECT in the last 72 hours.  Lab Results  Component Value Date   HGBA1C 6.5 (H) 10/21/2016   ------------------------------------------------------------------------------------------------------------------ Recent Labs    10/08/17 1708  T3FREE 1.6*   ------------------------------------------------------------------------------------------------------------------ No results for input(s): VITAMINB12, FOLATE, FERRITIN, TIBC, IRON, RETICCTPCT  in the last 72 hours.  Coagulation profile Recent Labs  Lab 10/05/17 1009  INR 1.08    No results for input(s): DDIMER in the last 72 hours.  Cardiac Enzymes No results for input(s): CKMB, TROPONINI, MYOGLOBIN in the last 168 hours.  Invalid input(s): CK ------------------------------------------------------------------------------------------------------------------ No results found for: BNP  Inpatient Medications  Scheduled Meds: . aspirin EC  81 mg Oral Daily  . benztropine  1 mg Oral Daily  . buPROPion  300 mg Oral Daily  . Chlorhexidine Gluconate  Cloth  6 each Topical Q0600  . divalproex  500 mg Oral QHS  . fluPHENAZine  5 mg Oral Daily  . insulin aspart  0-9 Units Subcutaneous TID WC  . insulin glargine  5 Units Subcutaneous QHS  . latanoprost  1 drop Both Eyes QHS  . multivitamin with minerals  1 tablet Oral Daily  . simvastatin  40 mg Oral Daily  . sodium bicarbonate  50 mEq Intravenous Once   Continuous Infusions: . sodium chloride    . sodium chloride    . sodium chloride    . sodium chloride    .  ceFAZolin (ANCEF) IV     PRN Meds:.sodium chloride, sodium chloride, sodium chloride, sodium chloride, acetaminophen **OR** acetaminophen, heparin, hydrALAZINE, lidocaine (PF), lidocaine-prilocaine, ondansetron **OR** ondansetron (ZOFRAN) IV, pentafluoroprop-tetrafluoroeth  Micro Results Recent Results (from the past 240 hour(s))  MRSA PCR Screening     Status: None   Collection Time: 10/05/17 12:44 AM  Result Value Ref Range Status   MRSA by PCR NEGATIVE NEGATIVE Final    Comment:        The GeneXpert MRSA Assay (FDA approved for NASAL specimens only), is one component of a comprehensive MRSA colonization surveillance program. It is not intended to diagnose MRSA infection nor to guide or monitor treatment for MRSA infections. Performed at [facility], [address], [place], [place] [address]     Radiology Reports Dg Chest 1 View  Result Date: 10/04/2017 CLINICAL DATA:  Patient fell today. Right leg pain. Sleeping more than usual. EXAM: CHEST  1 VIEW COMPARISON:  10/21/2016 FINDINGS: Right central venous catheter with tip over the right atrium. Additional catheter fragment demonstrated in the right axillary region. Shallow inspiration with elevation of the right hemidiaphragm. Cardiac enlargement with mild vascular congestion. No edema or consolidation. Linear atelectasis in the lung bases. No blunting of costophrenic angles. No pneumothorax. Degenerative changes in the shoulders. IMPRESSION: Shallow inspiration  with linear atelectasis in the lung bases. Cardiac enlargement with mild vascular congestion. No edema or consolidation. Electronically Signed   By: Lucienne Capers M.D.   On: 10/04/2017 21:05   Dg Pelvis 1-2 Views  Result Date: 10/04/2017 CLINICAL DATA:  Patient fell today. Right leg pain. Sleeping more than usual. EXAM: PELVIS - 1-2 VIEW COMPARISON:  None. FINDINGS: Mild degenerative changes in the hips. There is no evidence of pelvic fracture or diastasis. No pelvic bone lesions are seen. Vascular calcifications. Calcified phleboliths in the pelvis. IMPRESSION: No acute bony abnormalities identified. Electronically Signed   By: Lucienne Capers M.D.   On: 10/04/2017 21:04   Ct Head Wo Contrast  Result Date: 10/04/2017 CLINICAL DATA:  Altered mental status. EXAM: CT HEAD WITHOUT CONTRAST CT CERVICAL SPINE WITHOUT CONTRAST TECHNIQUE: Multidetector CT imaging of the head and cervical spine was performed following the standard protocol without intravenous contrast. Multiplanar CT image reconstructions of the cervical spine were also generated. COMPARISON:  Head CT dated 10/21/2016. FINDINGS: CT HEAD FINDINGS Brain:  Again noted is generalized parenchymal atrophy with commensurate dilatation of the ventricles and sulci. Ventricles are stable in size. There is no mass, hemorrhage, edema or other evidence of acute parenchymal abnormality. No extra-axial hemorrhage. Vascular: There are chronic calcified atherosclerotic changes of the large vessels at the skull base. No unexpected hyperdense vessel. Skull: Normal. Negative for fracture or focal lesion. Sinuses/Orbits: No acute finding. Other: None. CT CERVICAL SPINE FINDINGS Alignment: Mild scoliosis at the cervicothoracic junction. No evidence of acute vertebral body subluxation. Skull base and vertebrae: No fracture line or displaced fracture fragment seen. No acute or suspicious osseous lesion. Soft tissues and spinal canal: No prevertebral fluid or swelling.  No visible canal hematoma. Disc levels: Mild disc desiccation at C5-6. No significant central canal stenosis at any level. Upper chest: No acute or significant findings. Other: Bilateral carotid atherosclerosis. IMPRESSION: 1. No acute intracranial abnormality. No intracranial mass, hemorrhage or edema. 2. No acute or significant findings within the cervical spine. Mild degenerative change at the C5-6 level. 3. Carotid atherosclerosis. Electronically Signed   By: Franki Cabot M.D.   On: 10/04/2017 20:57   Ct Cervical Spine Wo Contrast  Result Date: 10/04/2017 CLINICAL DATA:  Altered mental status. EXAM: CT HEAD WITHOUT CONTRAST CT CERVICAL SPINE WITHOUT CONTRAST TECHNIQUE: Multidetector CT imaging of the head and cervical spine was performed following the standard protocol without intravenous contrast. Multiplanar CT image reconstructions of the cervical spine were also generated. COMPARISON:  Head CT dated 10/21/2016. FINDINGS: CT HEAD FINDINGS Brain: Again noted is generalized parenchymal atrophy with commensurate dilatation of the ventricles and sulci. Ventricles are stable in size. There is no mass, hemorrhage, edema or other evidence of acute parenchymal abnormality. No extra-axial hemorrhage. Vascular: There are chronic calcified atherosclerotic changes of the large vessels at the skull base. No unexpected hyperdense vessel. Skull: Normal. Negative for fracture or focal lesion. Sinuses/Orbits: No acute finding. Other: None. CT CERVICAL SPINE FINDINGS Alignment: Mild scoliosis at the cervicothoracic junction. No evidence of acute vertebral body subluxation. Skull base and vertebrae: No fracture line or displaced fracture fragment seen. No acute or suspicious osseous lesion. Soft tissues and spinal canal: No prevertebral fluid or swelling. No visible canal hematoma. Disc levels: Mild disc desiccation at C5-6. No significant central canal stenosis at any level. Upper chest: No acute or significant findings.  Other: Bilateral carotid atherosclerosis. IMPRESSION: 1. No acute intracranial abnormality. No intracranial mass, hemorrhage or edema. 2. No acute or significant findings within the cervical spine. Mild degenerative change at the C5-6 level. 3. Carotid atherosclerosis. Electronically Signed   By: Franki Cabot M.D.   On: 10/04/2017 20:57   Mr Brain Wo Contrast  Result Date: 10/06/2017 CLINICAL DATA:  Altered level of consciousness for 2 days EXAM: MRI HEAD WITHOUT CONTRAST TECHNIQUE: Multiplanar, multiecho pulse sequences of the brain and surrounding structures were obtained without intravenous contrast. COMPARISON:  Head CT from 2 days ago FINDINGS: Very motion degraded sagittal T1 and diffusion imaging was acquired. No detected infarct. There is atrophy with ventriculomegaly. No detected midline shift. The scan was terminated due to patient request. IMPRESSION: 1. Only severely motion degraded T1 and diffusion imaging could be acquired. 2. No detected infarct or other acute finding. 3. Generalized atrophy. Electronically Signed   By: Monte Fantasia M.D.   On: 10/06/2017 11:08   Ir Fluoro Guide Cv Line Left  Result Date: 10/08/2017 INDICATION: History of end-stage renal disease now with clotted dialysis graft. [facility] made for placement tunneled hemodialysis catheter. EXAM: TUNNELED  CENTRAL VENOUS HEMODIALYSIS CATHETER PLACEMENT WITH ULTRASOUND AND FLUOROSCOPIC GUIDANCE MEDICATIONS: Ancef 2 gm IV . The antibiotic was given in an appropriate time interval prior to skin puncture. ANESTHESIA/SEDATION: Versed 1 mg IV; Fentanyl 50 mcg IV; Moderate Sedation Time: 13 minutes The patient was continuously monitored during the procedure by the interventional radiology nurse under my direct supervision. FLUOROSCOPY TIME:  Fluoroscopy Time: 18 seconds (3 mGy). COMPLICATIONS: None immediate. PROCEDURE: Informed written consent was obtained from the patient's family after a discussion of the risks, benefits, and  alternatives to treatment. Questions regarding the procedure were encouraged and answered. The left neck and chest were prepped with chlorhexidine in a sterile fashion, and a sterile drape was applied covering the operative field. Maximum barrier sterile technique with sterile gowns and gloves were used for the procedure. A timeout was performed prior to the initiation of the procedure. After creating a small venotomy incision, a micropuncture kit was utilized to access the internal jugular vein. Real-time ultrasound guidance was utilized for vascular access including the acquisition of a permanent ultrasound image documenting patency of the accessed vessel. The microwire was utilized to measure appropriate catheter length. A stiff Glidewire was advanced to the level of the IVC and the micropuncture sheath was exchanged for a peel-away sheath. A palindrome tunneled hemodialysis catheter measuring 19 cm from tip to cuff was tunneled in a retrograde fashion from the anterior chest wall to the venotomy incision. The catheter was then placed through the peel-away sheath with tips ultimately positioned within the superior aspect of the right atrium. Final catheter positioning was confirmed and documented with a spot radiographic image. Note is made of an abandoned right chest wall hero graft. The catheter aspirates and flushes normally. The catheter was flushed with appropriate volume heparin dwells. The catheter exit site was secured with a 0-Prolene retention suture. The venotomy incision was closed with an interrupted 4-0 Vicryl, Dermabond and Steri-strips. Dressings were applied. The patient tolerated the procedure well without immediate post procedural complication. IMPRESSION: Successful placement of 19 cm tip to cuff tunneled hemodialysis catheter via the left internal jugular vein with tips terminating within the superior aspect of the right atrium. The catheter is ready for immediate use. Electronically Signed    By: Sandi Mariscal M.D.   On: 10/08/2017 13:11   Ir US Guide Vasc Access Left  Result Date: 10/08/2017 INDICATION: History of end-stage renal disease now with clotted dialysis graft. [facility] made for placement tunneled hemodialysis catheter. EXAM: TUNNELED CENTRAL VENOUS HEMODIALYSIS CATHETER PLACEMENT WITH ULTRASOUND AND FLUOROSCOPIC GUIDANCE MEDICATIONS: Ancef 2 gm IV . The antibiotic was given in an appropriate time interval prior to skin puncture. ANESTHESIA/SEDATION: Versed 1 mg IV; Fentanyl 50 mcg IV; Moderate Sedation Time: 13 minutes The patient was continuously monitored during the procedure by the interventional radiology nurse under my direct supervision. FLUOROSCOPY TIME:  Fluoroscopy Time: 18 seconds (3 mGy). COMPLICATIONS: None immediate. PROCEDURE: Informed written consent was obtained from the patient's family after a discussion of the risks, benefits, and alternatives to treatment. Questions regarding the procedure were encouraged and answered. The left neck and chest were prepped with chlorhexidine in a sterile fashion, and a sterile drape was applied covering the operative field. Maximum barrier sterile technique with sterile gowns and gloves were used for the procedure. A timeout was performed prior to the initiation of the procedure. After creating a small venotomy incision, a micropuncture kit was utilized to access the internal jugular vein. Real-time ultrasound guidance was utilized for vascular access  including the acquisition of a permanent ultrasound image documenting patency of the accessed vessel. The microwire was utilized to measure appropriate catheter length. A stiff Glidewire was advanced to the level of the IVC and the micropuncture sheath was exchanged for a peel-away sheath. A palindrome tunneled hemodialysis catheter measuring 19 cm from tip to cuff was tunneled in a retrograde fashion from the anterior chest wall to the venotomy incision. The catheter was then placed through the  peel-away sheath with tips ultimately positioned within the superior aspect of the right atrium. Final catheter positioning was confirmed and documented with a spot radiographic image. Note is made of an abandoned right chest wall hero graft. The catheter aspirates and flushes normally. The catheter was flushed with appropriate volume heparin dwells. The catheter exit site was secured with a 0-Prolene retention suture. The venotomy incision was closed with an interrupted 4-0 Vicryl, Dermabond and Steri-strips. Dressings were applied. The patient tolerated the procedure well without immediate post procedural complication. IMPRESSION: Successful placement of 19 cm tip to cuff tunneled hemodialysis catheter via the left internal jugular vein with tips terminating within the superior aspect of the right atrium. The catheter is ready for immediate use. Electronically Signed   By: Sandi Mariscal M.D.   On: 10/08/2017 13:11   Dg Femur Portable Min 2 Views Right  Result Date: 10/04/2017 CLINICAL DATA:  Per ED notes: [facility] reports that pt fell today, pt from [facility] and that pt had some right leg pain, pt denies pain at present. Reported that has been sleeping more than usual. EXAM: RIGHT FEMUR PORTABLE 2 VIEW COMPARISON:  None. FINDINGS: No fracture RIGHT femur. No dislocation. Atherosclerotic calcification noted. IMPRESSION: No fracture or dislocation. Electronically Signed   By: Suzy Bouchard M.D.   On: 10/04/2017 21:02    Time Spent in minutes  20  Sheniah Supak M.D on 10/10/2017 at 10:26 AM  Between 7am to 7pm - Pager - [phone]  After 7pm go to [email or web address] - password [facility]  [facility] -  Office  [phone]

## 2017-10-11 LAB — RENAL FUNCTION PANEL
ALBUMIN: 3 g/dL — AB (ref 3.5–5.0)
ANION GAP: 13 (ref 5–15)
BUN: 19 mg/dL (ref 6–20)
CALCIUM: 9.1 mg/dL (ref 8.9–10.3)
CO2: 26 mmol/L (ref 22–32)
CREATININE: 5.06 mg/dL — AB (ref 0.44–1.00)
Chloride: 95 mmol/L — ABNORMAL LOW (ref 101–111)
GFR calc Af Amer: 10 mL/min — ABNORMAL LOW (ref >60)
GFR calc non Af Amer: 9 mL/min — ABNORMAL LOW (ref >60)
GLUCOSE: 137 mg/dL — AB (ref 65–99)
PHOSPHORUS: 5.1 mg/dL — AB (ref 2.5–4.6)
Potassium: 3.9 mmol/L (ref 3.5–5.1)
SODIUM: 134 mmol/L — AB (ref 135–145)

## 2017-10-11 LAB — GLUCOSE, CAPILLARY
GLUCOSE-CAPILLARY: 153 mg/dL — AB (ref 65–99)
Glucose-Capillary: 134 mg/dL — ABNORMAL HIGH (ref 65–99)
Glucose-Capillary: 104 mg/dL — ABNORMAL HIGH (ref 65–99)
Glucose-Capillary: 226 mg/dL — ABNORMAL HIGH (ref 65–99)

## 2017-10-11 NOTE — Progress Notes (Signed)
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  She is also on multiple medications including Cogentin, Wellbutrin, Depakote, Prolixin, Ativan and Lyrica.  I will resume all of them except for Ativan and Lyrica. No signs of infection.  B12 was normal.  Elevated TSH with low normal free T4 and low T3.  We will start her on Synthroid.  MRI of the brain shows generalized atrophy.  Mental status appears to be at baseline now.  Active Problems: Bipolar disorder/schizophrenia Resident of group home.  Not sure what her baseline mental status is.  Home meds have been resumed except for Ativan and Lyrica.   ESRD (end stage renal disease) ([place]) Thrombosed AV graft.  Tunneled dialysis catheter placed by IR on 6/20.  Received hemodialysis after that.   Nephrology following.   Diabetes mellitus type 2 CBG stable.  Continue Lantus with sliding scale coverage.     Hyperkalemia Resolved with dialysis.    Anemia in ESRD (end-stage renal disease) ([place]) Stable.   Essential hypertension Stable.  Continue home meds     Code Status: full  Family Communication: patient   Disposition Plan: awaiting for placement   Consultants:  nephrology  Procedures:  HD  Antibiotics:  none   Objective: BP 136/89 (BP Location: Right  Arm)   Pulse 62   Temp 98.9 F (37.2 C) (Oral)   Resp 18   Ht 5' (1.524 m)   Wt 64.1 kg (141 lb 5 oz)   SpO2 98%   BMI 27.60 kg/m   Intake/Output Summary (Last 24 hours) at 10/11/2017 1126 Last data filed at 10/11/2017 0900 Gross per 24 hour  Intake 480 ml  Output 491 ml  Net -11 ml   Filed Weights   10/10/17 1140 10/10/17 1520 10/11/17 0537  Weight: 67.5 kg (148 lb 13 oz) 66.7 kg (147 lb 0.8 oz) 64.1 kg (141 lb 5 oz)    Exam: Patient is examined daily including today on 10/11/2017, exams remain the same as of yesterday except that has changed    General:  Alert, slow in answering questions, following commands, oriented to self  Cardiovascular: RRR  Respiratory: CTABL  Abdomen: Soft/ND/NT, positive BS  Musculoskeletal: No Edema  Neuro: alert, oriented to self  Data Reviewed: Basic Metabolic Panel: Recent Labs  Lab 10/04/17 1819  10/05/17 0428 10/06/17 0422 10/07/17 0555 10/10/17 0617 10/11/17 0610  NA 132*   < > 138 137 138 134* 134*  K 6.1*   < > 5.7* 6.5* 4.4 3.5 3.9  CL 96*   < > 103 103 100* 93* 95*  CO2 26   < > 24 23 27 26 26   GLUCOSE 328*   < > 228* 198* 121* 185* 137*  BUN 65*   < > 69* 52* 45* 35* 19  CREATININE 9.36*   < > 9.39* 7.85* 8.26* 6.82* 5.06*  CALCIUM 9.6   < > 9.8 9.7 9.3 9.2 9.1  PHOS 3.6  --   --  3.1 5.2* 5.8* 5.1*   < > = values in this interval not displayed.   Liver Function Tests: Recent Labs  Lab 10/05/17 0428 10/06/17 0422 10/07/17 0555 10/10/17 0617 10/11/17 0610  AST 16  --   --   --   --   ALT 14  --   --   --   --   ALKPHOS 96  --   --   --   --   BILITOT 0.5  --   --   --   --   PROT 7.6  --   --   --   --   ALBUMIN 3.3* 3.5 3.0* 3.1* 3.0*   No results for input(s): LIPASE, AMYLASE in the last 168 hours. Recent Labs  Lab 10/05/17 0428  AMMONIA 30   CBC: Recent Labs  Lab 10/04/17 1819 10/05/17 0428 10/06/17 0422 10/10/17 0617  WBC 5.2 5.6 9.6 5.8  NEUTROABS 2.9  --   --   --   HGB 10.8* 11.6* 13.1  10.8*  HCT 33.9* 35.6* 40.9 33.0*  MCV 92.4 92.0 93.4 90.2  PLT 205 166 146* 145*   Cardiac Enzymes:   No results for input(s): CKTOTAL, CKMB, CKMBINDEX, TROPONINI in the last 168 hours. BNP (last 3 results) No results for input(s): BNP in the last 8760 hours.  ProBNP (last 3 results) No results for input(s): PROBNP in the last 8760 hours.  CBG: Recent Labs  Lab 10/10/17 1151 10/10/17 1613 10/10/17 2116 10/11/17 0715 10/11/17 1107  GLUCAP 128* 96 193* 134* 153*    Recent Results (from the past 240 hour(s))  MRSA PCR Screening     Status: None   Collection Time: 10/05/17 12:44 AM  Result Value Ref Range Status   MRSA by PCR NEGATIVE NEGATIVE Final    Comment:        The GeneXpert MRSA Assay (FDA approved for NASAL specimens only), is one component of a comprehensive MRSA colonization surveillance program. It is not intended to diagnose MRSA infection nor to guide or monitor treatment for MRSA infections. Performed at [facility], [address], [place], [place] [address]      Studies: No results found.  Scheduled Meds: . aspirin EC  81 mg Oral Daily  . benztropine  1 mg Oral Daily  . buPROPion  300 mg Oral Daily  . Chlorhexidine Gluconate Cloth  6 each Topical Q0600  . divalproex  500 mg Oral QHS  . fluPHENAZine  5 mg Oral Daily  . insulin aspart  0-9 Units Subcutaneous TID WC  . insulin glargine  5 Units Subcutaneous QHS  . latanoprost  1 drop Both Eyes QHS  . levothyroxine  25 mcg Oral QAC breakfast  . multivitamin with minerals  1 tablet Oral Daily  . simvastatin  40 mg Oral Daily  . sodium bicarbonate  50 mEq Intravenous Once    Continuous Infusions: . sodium chloride    . sodium chloride    . sodium chloride    . sodium chloride    .  ceFAZolin (ANCEF) IV       Time spent: 15 mins I have personally reviewed and interpreted on  10/11/2017 daily labs,  imagings as discussed above under date review session and assessment and plans.  I  reviewed all nursing notes, consultant notes,  vitals, pertinent old records  I have discussed plan  of care as described above with RN , patient  on 10/11/2017   Florencia Reasons MD, PhD  [facility] Pager [phone]. If 7PM-7AM, please contact night-coverage at [email or web address], password [facility] 10/11/2017, 11:26 AM  LOS: 6 days

## 2017-10-11 NOTE — Progress Notes (Signed)
Subjective: Interval History: She offers offers no complaint..    Objective: Vital signs in last 24 hours: Temp:  [98 F (36.7 C)-98.9 F (37.2 C)] 98.9 F (37.2 C) (06/23 0537) Pulse Rate:  [62-81] 62 (06/23 0537) Resp:  [18] 18 (06/23 0537) BP: (87-139)/(44-89) 136/89 (06/23 0537) SpO2:  [99 %-100 %] 100 % (06/23 0537) Weight:  [64.1 kg (141 lb 5 oz)-67.5 kg (148 lb 13 oz)] 64.1 kg (141 lb 5 oz) (06/23 0537) Weight change:   Intake/Output from previous day: 06/22 0701 - 06/23 0700 In: 480 [P.O.:480] Out: 491  Intake/Output this shift: No intake/output data recorded.   generally : Patient is lying in bed and in no apparent distress Chest is clear to auscultation Heart exam revealed regular rate and rhythm no murmur no S3 Extremities no edema  Lab Results: Recent Labs    10/10/17 0617  WBC 5.8  HGB 10.8*  HCT 33.0*  PLT 145*   BMET:  Recent Labs    10/10/17 0617 10/11/17 0610  NA 134* 134*  K 3.5 3.9  CL 93* 95*  CO2 26 26  GLUCOSE 185* 137*  BUN 35* 19  CREATININE 6.82* 5.06*  CALCIUM 9.2 9.1   No results for input(s): PTH in the last 72 hours. Iron Studies: No results for input(s): IRON, TIBC, TRANSFERRIN, FERRITIN in the last 72 hours.  Studies/Results: No results found.  I have reviewed the patient's current medications.  Assessment/Plan: 1] altered mental status: Has improved. 2] end-stage renal disease: She is status post hemodialysis yesterday.  No nausea or vomiting.  Potassium is 3.9. 3] anemia: Her hemoglobin has remained within our target goal.  She is on Epogen . 4] bone and mineral disorder: Her calcium and phosphorus is a range 5] hypertension: Her blood pressure is reasonably controlled  6] history of diabetes: Her blood sugar is reasonably controlled 7] fluid management: Does not have any sign of fluid overload.  We are able to remove all 500 cc of fluid.    Plan: 1].  Patient does not require dialysis. 2] her next dialysis will be  on Tuesday which is her regular schedule. 3] we will check her CBC and renal panel in the morning.   LOS: 6 days   Maximos Zayas S 10/11/2017,8:35 AM

## 2017-10-12 LAB — GLUCOSE, CAPILLARY
GLUCOSE-CAPILLARY: 106 mg/dL — AB (ref 65–99)
Glucose-Capillary: 151 mg/dL — ABNORMAL HIGH (ref 65–99)
Glucose-Capillary: 145 mg/dL — ABNORMAL HIGH (ref 65–99)
Glucose-Capillary: 135 mg/dL — ABNORMAL HIGH (ref 65–99)

## 2017-10-12 LAB — RENAL FUNCTION PANEL
Albumin: 3 g/dL — ABNORMAL LOW (ref 3.5–5.0)
Anion gap: 12 (ref 5–15)
BUN: 29 mg/dL — AB (ref 6–20)
CO2: 28 mmol/L (ref 22–32)
CREATININE: 6.95 mg/dL — AB (ref 0.44–1.00)
Calcium: 9.2 mg/dL (ref 8.9–10.3)
Chloride: 95 mmol/L — ABNORMAL LOW (ref 101–111)
GFR calc Af Amer: 7 mL/min — ABNORMAL LOW (ref >60)
GFR calc non Af Amer: 6 mL/min — ABNORMAL LOW (ref >60)
GLUCOSE: 99 mg/dL (ref 65–99)
PHOSPHORUS: 5.5 mg/dL — AB (ref 2.5–4.6)
Potassium: 3.9 mmol/L (ref 3.5–5.1)
Sodium: 135 mmol/L (ref 135–145)

## 2017-10-12 NOTE — Progress Notes (Signed)
PROGRESS NOTE                                                                                                                                                                                                             Patient Demographics:    [name], is a 57 y.o. female, DOB - 1960/11/18, RFF:[id number]  Admit date - 10/04/2017   Admitting Physician Reubin Milan, MD  Outpatient Primary MD for the patient is Patient, No Pcp Per  LOS - 7  Outpatient Specialists: renal/ psychiatry ( Dr Candee Furbish)  Chief Complaint  Patient presents with  . 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  Admitted for further management.     Subjective:   deneis any symptoms. No overnight events   Assessment  & Plan :    Principal Problem: Acute metabolic encephalopathy Suspect this is uremic with combination of her underlying bipolar disorder and schizophrenia.  She is also on multiple medications including Cogentin, Wellbutrin, Depakote, Prolixin, Ativan and Lyrica.  I will resume all of them except for Ativan and Lyrica. No signs of infection.  B12 was normal.  Elevated TSH with low normal free T4 and low T3.   started her on Synthroid.  MRI of the brain shows generalized atrophy.  Mental status appears to be at baseline now.  Active Problems: Bipolar disorder/schizophrenia Resident of group home.  Not sure what her baseline mental status is.  Home meds have been resumed except for Ativan and Lyrica.   ESRD (end stage renal disease) ([place]) Thrombosed AV graft.  Tunneled dialysis catheter placed by IR on 6/20.  Received hemodialysis after that.   Nephrology following.   Diabetes mellitus type 2 CBG  stable.  Continue Lantus with sliding scale coverage.     Hyperkalemia Resolved with dialysis.    Anemia in ESRD (end-stage renal disease) ([place]) Stable.   Essential hypertension Stable.  Continue home meds      Code Status : full code  Family Communication  : none at bedside  Disposition Plan  : PT recommends assisted living.  difficult placement. also on monthly invega for  which facilities declining due to cost. Have called her psychiatrist office Chrissie Noa hedden phone [phone]) and asked his staff to have himcall me back .  Barriers For Discharge : Awaiting placement and call back from psychiatrist to adjust her meds  Consults  : Renal, surgery, IR  Procedures  : Right femoral catheter, MRI brain    Lab Results  Component Value Date   PLT 145 (L) 10/10/2017    Antibiotics  :    Anti-infectives (From admission, onward)   Start     Dose/Rate Route Frequency Ordered Stop   10/08/17 1400  ceFAZolin (ANCEF) IVPB 2g/100 mL premix  Status:  Discontinued     2 g 200 mL/hr over 30 Minutes Intravenous Every 8 hours 10/08/17 1224 10/08/17 1225   10/08/17 1230  ceFAZolin (ANCEF) IVPB 2g/100 mL premix     2 g 200 mL/hr over 30 Minutes Intravenous  Once 10/08/17 1225          Objective:   Vitals:   10/12/17 1330 10/12/17 1345 10/12/17 1400 10/12/17 1405  BP: 109/64 125/62 122/60 133/77  Pulse: 65 63 62 69  Resp:    18  Temp:    97.9 F (36.6 C)  TempSrc:    Oral  SpO2:    100%  Weight:    66 kg (145 lb 8.1 oz)  Height:        Wt Readings from Last 3 Encounters:  10/12/17 66 kg (145 lb 8.1 oz)  10/24/16 71.5 kg (157 lb 10.1 oz)     Intake/Output Summary (Last 24 hours) at 10/12/2017 1537 Last data filed at 10/12/2017 1400 Gross per 24 hour  Intake 360 ml  Output 1000 ml  Net -640 ml    Physical ecxam NAD  chest: clear b/l, TDC CVS: N S1&S2 GI: soft, NT, ND  musculoskeletal: warm, no edema      Data Review:    CBC Recent  Labs  Lab 10/06/17 0422 10/10/17 0617  WBC 9.6 5.8  HGB 13.1 10.8*  HCT 40.9 33.0*  PLT 146* 145*  MCV 93.4 90.2  MCH 29.9 29.5  MCHC 32.0 32.7  RDW 15.6* 15.4    Chemistries  Recent Labs  Lab 10/06/17 0422 10/07/17 0555 10/10/17 0617 10/11/17 0610 10/12/17 0535  NA 137 138 134* 134* 135  K 6.5* 4.4 3.5 3.9 3.9  CL 103 100* 93* 95* 95*  CO2 _0 GLUCOSE 198* 121* 185* 137* 99  BUN 52* 45* 35* 19 29*  CREATININE 7.85* 8.26* 6.82* 5.06* 6.95*  CALCIUM 9.7 9.3 9.2 9.1 9.2   ------------------------------------------------------------------------------------------------------------------ No results for input(s): CHOL, HDL, LDLCALC, TRIG, CHOLHDL, LDLDIRECT in the last 72 hours.  Lab Results  Component Value Date   HGBA1C 6.5 (H) 10/21/2016   ------------------------------------------------------------------------------------------------------------------ No results for input(s): TSH, T4TOTAL, T3FREE, THYROIDAB in the last 72 hours.  Invalid input(s): FREET3 ------------------------------------------------------------------------------------------------------------------ No results for input(s): VITAMINB12, FOLATE, FERRITIN, TIBC, IRON, RETICCTPCT in the last 72 hours.  Coagulation profile No results for input(s): INR, PROTIME in the last 168 hours.  No results for input(s): DDIMER in the last 72 hours.  Cardiac Enzymes No results for input(s): CKMB, TROPONINI, MYOGLOBIN in the last 168 hours.  Invalid input(s): CK ------------------------------------------------------------------------------------------------------------------ No results found for: BNP  Inpatient Medications  Scheduled Meds: . aspirin EC  81 mg Oral Daily  . benztropine  1 mg Oral Daily  . buPROPion  300 mg Oral Daily  . Chlorhexidine Gluconate Cloth  6 each Topical Q0600  . divalproex  500 mg Oral QHS  . fluPHENAZine  5 mg Oral Daily  . insulin aspart  0-9 Units Subcutaneous  TID WC  . insulin glargine  5 Units Subcutaneous QHS  . latanoprost  1 drop Both Eyes QHS  . levothyroxine  25 mcg Oral QAC breakfast  . multivitamin with minerals  1 tablet Oral Daily  . simvastatin  40 mg Oral Daily  . sodium bicarbonate  50 mEq Intravenous Once   Continuous Infusions: . sodium chloride    . sodium chloride    . sodium chloride    . sodium chloride    .  ceFAZolin (ANCEF) IV     PRN Meds:.sodium chloride, sodium chloride, sodium chloride, sodium chloride, acetaminophen **OR** acetaminophen, alteplase, heparin, heparin, hydrALAZINE, lidocaine (PF), lidocaine-prilocaine, ondansetron **OR** ondansetron (ZOFRAN) IV, pentafluoroprop-tetrafluoroeth  Micro Results Recent Results (from the past 240 hour(s))  MRSA PCR Screening     Status: None   Collection Time: 10/05/17 12:44 AM  Result Value Ref Range Status   MRSA by PCR NEGATIVE NEGATIVE Final    Comment:        The GeneXpert MRSA Assay (FDA approved for NASAL specimens only), is one component of a comprehensive MRSA colonization surveillance program. It is not intended to diagnose MRSA infection nor to guide or monitor treatment for MRSA infections. Performed at [facility], [address], [place], [place] [address]     Radiology Reports Dg Chest 1 View  Result Date: 10/04/2017 CLINICAL DATA:  Patient fell today. Right leg pain. Sleeping more than usual. EXAM: CHEST  1 VIEW COMPARISON:  10/21/2016 FINDINGS: Right central venous catheter with tip over the right atrium. Additional catheter fragment demonstrated in the right axillary region. Shallow inspiration with elevation of the right hemidiaphragm. Cardiac enlargement with mild vascular congestion. No edema or consolidation. Linear atelectasis in the lung bases. No blunting of costophrenic angles. No pneumothorax. Degenerative changes in the shoulders. IMPRESSION: Shallow inspiration with linear atelectasis in the lung bases. Cardiac enlargement with mild  vascular congestion. No edema or consolidation. Electronically Signed   By: Lucienne Capers M.D.   On: 10/04/2017 21:05   Dg Pelvis 1-2 Views  Result Date: 10/04/2017 CLINICAL DATA:  Patient fell today. Right leg pain. Sleeping more than usual. EXAM: PELVIS - 1-2 VIEW COMPARISON:  None. FINDINGS: Mild degenerative changes in the hips. There is no evidence of pelvic fracture or diastasis. No pelvic bone lesions are seen. Vascular calcifications. Calcified phleboliths in the pelvis. IMPRESSION: No acute bony abnormalities identified. Electronically Signed   By: Lucienne Capers M.D.   On: 10/04/2017 21:04   Ct Head Wo Contrast  Result Date: 10/04/2017 CLINICAL DATA:  Altered mental status. EXAM: CT HEAD WITHOUT CONTRAST CT CERVICAL SPINE WITHOUT CONTRAST TECHNIQUE: Multidetector CT imaging of the head and cervical spine was performed following the standard protocol without intravenous contrast. Multiplanar CT image reconstructions of the cervical spine were also generated. COMPARISON:  Head CT dated 10/21/2016. FINDINGS: CT HEAD FINDINGS Brain: Again noted is generalized parenchymal atrophy with commensurate dilatation of the ventricles and sulci. Ventricles are stable in size. There is no mass, hemorrhage, edema or other evidence of acute parenchymal abnormality. No extra-axial hemorrhage. Vascular: There are chronic calcified atherosclerotic changes of the large vessels at the skull base. No unexpected hyperdense vessel. Skull: Normal. Negative for fracture or focal lesion. Sinuses/Orbits: No acute finding. Other: None. CT CERVICAL SPINE FINDINGS Alignment: Mild scoliosis at the cervicothoracic junction. No  evidence of acute vertebral body subluxation. Skull base and vertebrae: No fracture line or displaced fracture fragment seen. No acute or suspicious osseous lesion. Soft tissues and spinal canal: No prevertebral fluid or swelling. No visible canal hematoma. Disc levels: Mild disc desiccation at C5-6. No  significant central canal stenosis at any level. Upper chest: No acute or significant findings. Other: Bilateral carotid atherosclerosis. IMPRESSION: 1. No acute intracranial abnormality. No intracranial mass, hemorrhage or edema. 2. No acute or significant findings within the cervical spine. Mild degenerative change at the C5-6 level. 3. Carotid atherosclerosis. Electronically Signed   By: Franki Cabot M.D.   On: 10/04/2017 20:57   Ct Cervical Spine Wo Contrast  Result Date: 10/04/2017 CLINICAL DATA:  Altered mental status. EXAM: CT HEAD WITHOUT CONTRAST CT CERVICAL SPINE WITHOUT CONTRAST TECHNIQUE: Multidetector CT imaging of the head and cervical spine was performed following the standard protocol without intravenous contrast. Multiplanar CT image reconstructions of the cervical spine were also generated. COMPARISON:  Head CT dated 10/21/2016. FINDINGS: CT HEAD FINDINGS Brain: Again noted is generalized parenchymal atrophy with commensurate dilatation of the ventricles and sulci. Ventricles are stable in size. There is no mass, hemorrhage, edema or other evidence of acute parenchymal abnormality. No extra-axial hemorrhage. Vascular: There are chronic calcified atherosclerotic changes of the large vessels at the skull base. No unexpected hyperdense vessel. Skull: Normal. Negative for fracture or focal lesion. Sinuses/Orbits: No acute finding. Other: None. CT CERVICAL SPINE FINDINGS Alignment: Mild scoliosis at the cervicothoracic junction. No evidence of acute vertebral body subluxation. Skull base and vertebrae: No fracture line or displaced fracture fragment seen. No acute or suspicious osseous lesion. Soft tissues and spinal canal: No prevertebral fluid or swelling. No visible canal hematoma. Disc levels: Mild disc desiccation at C5-6. No significant central canal stenosis at any level. Upper chest: No acute or significant findings. Other: Bilateral carotid atherosclerosis. IMPRESSION: 1. No acute  intracranial abnormality. No intracranial mass, hemorrhage or edema. 2. No acute or significant findings within the cervical spine. Mild degenerative change at the C5-6 level. 3. Carotid atherosclerosis. Electronically Signed   By: Franki Cabot M.D.   On: 10/04/2017 20:57   Mr Brain Wo Contrast  Result Date: 10/06/2017 CLINICAL DATA:  Altered level of consciousness for 2 days EXAM: MRI HEAD WITHOUT CONTRAST TECHNIQUE: Multiplanar, multiecho pulse sequences of the brain and surrounding structures were obtained without intravenous contrast. COMPARISON:  Head CT from 2 days ago FINDINGS: Very motion degraded sagittal T1 and diffusion imaging was acquired. No detected infarct. There is atrophy with ventriculomegaly. No detected midline shift. The scan was terminated due to patient request. IMPRESSION: 1. Only severely motion degraded T1 and diffusion imaging could be acquired. 2. No detected infarct or other acute finding. 3. Generalized atrophy. Electronically Signed   By: Monte Fantasia M.D.   On: 10/06/2017 11:08   Ir Fluoro Guide Cv Line Left  Result Date: 10/08/2017 INDICATION: History of end-stage renal disease now with clotted dialysis graft. [facility] made for placement tunneled hemodialysis catheter. EXAM: TUNNELED CENTRAL VENOUS HEMODIALYSIS CATHETER PLACEMENT WITH ULTRASOUND AND FLUOROSCOPIC GUIDANCE MEDICATIONS: Ancef 2 gm IV . The antibiotic was given in an appropriate time interval prior to skin puncture. ANESTHESIA/SEDATION: Versed 1 mg IV; Fentanyl 50 mcg IV; Moderate Sedation Time: 13 minutes The patient was continuously monitored during the procedure by the interventional radiology nurse under my direct supervision. FLUOROSCOPY TIME:  Fluoroscopy Time: 18 seconds (3 mGy). COMPLICATIONS: None immediate. PROCEDURE: Informed written consent was obtained from the  patient's family after a discussion of the risks, benefits, and alternatives to treatment. Questions regarding the procedure were  encouraged and answered. The left neck and chest were prepped with chlorhexidine in a sterile fashion, and a sterile drape was applied covering the operative field. Maximum barrier sterile technique with sterile gowns and gloves were used for the procedure. A timeout was performed prior to the initiation of the procedure. After creating a small venotomy incision, a micropuncture kit was utilized to access the internal jugular vein. Real-time ultrasound guidance was utilized for vascular access including the acquisition of a permanent ultrasound image documenting patency of the accessed vessel. The microwire was utilized to measure appropriate catheter length. A stiff Glidewire was advanced to the level of the IVC and the micropuncture sheath was exchanged for a peel-away sheath. A palindrome tunneled hemodialysis catheter measuring 19 cm from tip to cuff was tunneled in a retrograde fashion from the anterior chest wall to the venotomy incision. The catheter was then placed through the peel-away sheath with tips ultimately positioned within the superior aspect of the right atrium. Final catheter positioning was confirmed and documented with a spot radiographic image. Note is made of an abandoned right chest wall hero graft. The catheter aspirates and flushes normally. The catheter was flushed with appropriate volume heparin dwells. The catheter exit site was secured with a 0-Prolene retention suture. The venotomy incision was closed with an interrupted 4-0 Vicryl, Dermabond and Steri-strips. Dressings were applied. The patient tolerated the procedure well without immediate post procedural complication. IMPRESSION: Successful placement of 19 cm tip to cuff tunneled hemodialysis catheter via the left internal jugular vein with tips terminating within the superior aspect of the right atrium. The catheter is ready for immediate use. Electronically Signed   By: Sandi Mariscal M.D.   On: 10/08/2017 13:11   Ir US Guide Vasc  Access Left  Result Date: 10/08/2017 INDICATION: History of end-stage renal disease now with clotted dialysis graft. [facility] made for placement tunneled hemodialysis catheter. EXAM: TUNNELED CENTRAL VENOUS HEMODIALYSIS CATHETER PLACEMENT WITH ULTRASOUND AND FLUOROSCOPIC GUIDANCE MEDICATIONS: Ancef 2 gm IV . The antibiotic was given in an appropriate time interval prior to skin puncture. ANESTHESIA/SEDATION: Versed 1 mg IV; Fentanyl 50 mcg IV; Moderate Sedation Time: 13 minutes The patient was continuously monitored during the procedure by the interventional radiology nurse under my direct supervision. FLUOROSCOPY TIME:  Fluoroscopy Time: 18 seconds (3 mGy). COMPLICATIONS: None immediate. PROCEDURE: Informed written consent was obtained from the patient's family after a discussion of the risks, benefits, and alternatives to treatment. Questions regarding the procedure were encouraged and answered. The left neck and chest were prepped with chlorhexidine in a sterile fashion, and a sterile drape was applied covering the operative field. Maximum barrier sterile technique with sterile gowns and gloves were used for the procedure. A timeout was performed prior to the initiation of the procedure. After creating a small venotomy incision, a micropuncture kit was utilized to access the internal jugular vein. Real-time ultrasound guidance was utilized for vascular access including the acquisition of a permanent ultrasound image documenting patency of the accessed vessel. The microwire was utilized to measure appropriate catheter length. A stiff Glidewire was advanced to the level of the IVC and the micropuncture sheath was exchanged for a peel-away sheath. A palindrome tunneled hemodialysis catheter measuring 19 cm from tip to cuff was tunneled in a retrograde fashion from the anterior chest wall to the venotomy incision. The catheter was then placed through the peel-away sheath  with tips ultimately positioned within the  superior aspect of the right atrium. Final catheter positioning was confirmed and documented with a spot radiographic image. Note is made of an abandoned right chest wall hero graft. The catheter aspirates and flushes normally. The catheter was flushed with appropriate volume heparin dwells. The catheter exit site was secured with a 0-Prolene retention suture. The venotomy incision was closed with an interrupted 4-0 Vicryl, Dermabond and Steri-strips. Dressings were applied. The patient tolerated the procedure well without immediate post procedural complication. IMPRESSION: Successful placement of 19 cm tip to cuff tunneled hemodialysis catheter via the left internal jugular vein with tips terminating within the superior aspect of the right atrium. The catheter is ready for immediate use. Electronically Signed   By: Sandi Mariscal M.D.   On: 10/08/2017 13:11   Dg Femur Portable Min 2 Views Right  Result Date: 10/04/2017 CLINICAL DATA:  Per ED notes: [facility] reports that pt fell today, pt from [facility] and that pt had some right leg pain, pt denies pain at present. Reported that has been sleeping more than usual. EXAM: RIGHT FEMUR PORTABLE 2 VIEW COMPARISON:  None. FINDINGS: No fracture RIGHT femur. No dislocation. Atherosclerotic calcification noted. IMPRESSION: No fracture or dislocation. Electronically Signed   By: Suzy Bouchard M.D.   On: 10/04/2017 21:02    Time Spent in minutes  20     M.D on 10/12/2017 at 3:37 PM  Between 7am to 7pm - Pager - [phone]  After 7pm go to [email or web address] - password [facility]  [facility] -  Office  [phone]

## 2017-10-12 NOTE — Procedures (Signed)
     HEMODIALYSIS TREATMENT NOTE:   3.5 hour heparin-free dialysis completed via left IJ tunneled PC.  Exit site unremarkable. Goal met: 1 L removed without interruption in ultrafiltration.  All blood was returned.  Report given to Weyman Pedro, RN.   Rockwell Alexandria, RN, CDN

## 2017-10-12 NOTE — Progress Notes (Signed)
Subjective: Interval History: Patient denies any nausea or vomiting.  She states that she is feeling good.  Her appetite is good.   Objective: Vital signs in last 24 hours: Temp:  [97.8 F (36.6 C)-99.3 F (37.4 C)] 98.4 F (36.9 C) (06/24 0528) Pulse Rate:  [62-65] 63 (06/24 0528) Resp:  [18] 18 (06/24 0528) BP: (118-159)/(69-101) 159/101 (06/24 0528) SpO2:  [98 %-100 %] 100 % (06/24 0528) Weight:  [96.4 kg (212 lb 8.4 oz)] 96.4 kg (212 lb 8.4 oz) (06/24 0700) Weight change: 28.9 kg (63 lb 11.4 oz)  Intake/Output from previous day: 06/23 0701 - 06/24 0700 In: 840 [P.O.:840] Out: -  Intake/Output this shift: No intake/output data recorded.   generally : Patient is awake in no apparent distress Chest is clear to auscultation Heart exam revealed regular rate and rhythm no murmur no S3 Extremities no edema  Lab Results: Recent Labs    10/10/17 0617  WBC 5.8  HGB 10.8*  HCT 33.0*  PLT 145*   BMET:  Recent Labs    10/11/17 0610 10/12/17 0535  NA 134* 135  K 3.9 3.9  CL 95* 95*  CO2 26 28  GLUCOSE 137* 99  BUN 19 29*  CREATININE 5.06* 6.95*  CALCIUM 9.1 9.2   No results for input(s): PTH in the last 72 hours. Iron Studies: No results for input(s): IRON, TIBC, TRANSFERRIN, FERRITIN in the last 72 hours.  Studies/Results: No results found.  I have reviewed the patient's current medications.  Assessment/Plan: 1] altered mental status: Patient is awake answering questions. 2] end-stage renal disease: She is status post hemodialysis Saturday.  Patient presently does not have any nausea or vomiting.  Her potassium is normal. 3] anemia: Her hemoglobin has remained within our target goal.  She is on Epogen . 4] bone and mineral disorder: Her calcium and phosphorus is a range 5] hypertension: Her blood pressure is reasonably controlled  6] history of diabetes: Her blood sugar is reasonably controlled 7] fluid management: Does not have any sign and symptom of fluid  overload. Plan: We will make arrangement for patient to get dialysis today.  Patient states that she is Monday Wednesday Friday. 2] we will remove about 1 and half liters if systolic blood pressure remains above 90. 3] patient will go back to her regular outpatient dialysis unit when she is discharged.    LOS: 7 days   Lachina Salsberry S 10/12/2017,8:02 AM

## 2017-10-12 NOTE — Clinical Social Work Note (Signed)
Late Entry: LCSW following. Spent a great deal of time on Friday, along with Case Management Assistant, trying to find placement for pt. At this point, looking for ALF level of care. Pt has been declined by multiple facilities for various reasons. Once placement is found, pt's dialysis location will likely have to be changed as she currently goes to [facility] in [place].   Will continue working on this pt's disposition.

## 2017-10-13 LAB — GLUCOSE, CAPILLARY
Glucose-Capillary: 109 mg/dL — ABNORMAL HIGH (ref 70–99)
Glucose-Capillary: 250 mg/dL — ABNORMAL HIGH (ref 70–99)
Glucose-Capillary: 198 mg/dL — ABNORMAL HIGH (ref 70–99)
Glucose-Capillary: 228 mg/dL — ABNORMAL HIGH (ref 70–99)

## 2017-10-13 MED ORDER — HEPARIN SODIUM (PORCINE) 1000 UNIT/ML DIALYSIS: 1000.0000 [IU] | INTRAMUSCULAR | Status: DC | PRN

## 2017-10-13 MED ORDER — RISPERIDONE 1 MG PO TABS
3.0000 mg | ORAL_TABLET | Freq: Every day | ORAL | Status: DC
  Administered 2017-10-13: 3 mg via ORAL
  Filled 2017-10-13: qty 3

## 2017-10-13 MED ORDER — CHLORHEXIDINE GLUCONATE CLOTH 2 % EX PADS
6.0000 | MEDICATED_PAD | Freq: Every day | CUTANEOUS | Status: DC
  Administered 2017-10-13 – 2017-10-14 (×2): 6 via TOPICAL

## 2017-10-13 MED ORDER — ALTEPLASE 2 MG IJ SOLR: 2.0000 mg | Freq: Once | INTRAMUSCULAR | Status: DC | PRN

## 2017-10-13 MED ORDER — POLYETHYLENE GLYCOL 3350 17 G PO PACK
17.0000 g | PACK | Freq: Every day | ORAL | Status: DC
  Administered 2017-10-13 – 2017-10-14 (×2): 17 g via ORAL
  Filled 2017-10-13 (×2): qty 1

## 2017-10-13 NOTE — Clinical Social Work Note (Signed)
LCSW following. Per MD, pt's psychiatrist has okayed the transition from the injection to a daily Remeron. Updated [facility]. They would like pt to have her new medication here today, have dialysis tomorrow, and then they will accept her. Updated MD. Will follow up tomorrow.

## 2017-10-13 NOTE — Progress Notes (Signed)
Subjective: Interval History: Patient is sleepy but arousable.  Denies any difficulty breathing.  Denies also any nausea or vomiting.   Objective: Vital signs in last 24 hours: Temp:  [97.9 F (36.6 C)-98.4 F (36.9 C)] 98.3 F (36.8 C) (06/25 0547) Pulse Rate:  [53-77] 53 (06/25 0547) Resp:  [18] 18 (06/24 1600) BP: (95-149)/(50-77) 147/65 (06/25 0547) SpO2:  [92 %-100 %] 92 % (06/25 0547) Weight:  [65.7 kg (144 lb 13.5 oz)-67 kg (147 lb 11.3 oz)] 65.7 kg (144 lb 13.5 oz) (06/25 0547) Weight change: -29.4 kg (-64 lb 13.1 oz)  Intake/Output from previous day: 06/24 0701 - 06/25 0700 In: 360 [P.O.:360] Out: 1000  Intake/Output this shift: No intake/output data recorded.   generally : Patient is awake in no apparent distress Chest is clear to auscultation Heart exam revealed regular rate and rhythm no murmur no S3 Extremities no edema  Lab Results: No results for input(s): WBC, HGB, HCT, PLT in the last 72 hours. BMET:  Recent Labs    10/11/17 0610 10/12/17 0535  NA 134* 135  K 3.9 3.9  CL 95* 95*  CO2 26 28  GLUCOSE 137* 99  BUN 19 29*  CREATININE 5.06* 6.95*  CALCIUM 9.1 9.2   No results for input(s): PTH in the last 72 hours. Iron Studies: No results for input(s): IRON, TIBC, TRANSFERRIN, FERRITIN in the last 72 hours.  Studies/Results: No results found.  I have reviewed the patient's current medications.  Assessment/Plan: 1] altered mental status: Patient presently sleepy but arousable.Patient answer questions . Her mental status possibly his her base line 2] end-stage renal disease: She is status post hemodialysis yesterday  3] anemia: Her hemoglobin has remained within our target goal.  She is on Epogen . 4] bone and mineral disorder: Her calcium s a range but her phosphorus is slightly high. Patient on bidner 5] hypertension: Her blood pressure is reasonably controlled  6] history of diabetes: Her blood sugar is reasonably controlled 7] fluid  management: Does not have any sign and symptom of fluid overload.  We are able to remove about 1 L yesterday during dialysis. Plan:1] Patient does not require dialysis today 2]Her dialysis would be tomorrow which is her regular schedule. .3] patient will go back to her regular outpatient dialysis unit when she is discharged.    LOS: 8 days   Jaze Rodino S 10/13/2017,7:27 AM

## 2017-10-13 NOTE — Progress Notes (Signed)
PROGRESS NOTE                                                                                                                                                                                                             Patient Demographics:    [name], is a 57 y.o. female, DOB - 1961/02/13, KKX:[id number]  Admit date - 10/04/2017   Admitting Physician Reubin Milan, MD  Outpatient Primary MD for the patient is Patient, No Pcp Per  LOS - 8  Outpatient Specialists: renal/ psychiatry ( Dr Candee Furbish)  Chief Complaint  Patient presents with  . 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  Admitted for further management. Hospital course prolonged due to placement.     Subjective:    No overnight events   Assessment  & Plan :    Principal Problem: Acute metabolic encephalopathy Suspect this is uremic with combination of her underlying bipolar disorder and schizophrenia.  She is also on multiple medications including Cogentin, Wellbutrin, Depakote, Prolixin, Ativan and Lyrica.  I will resume all of them except for Ativan and Lyrica. No signs of infection.  B12 was normal.  Elevated TSH with low normal free T4 and low T3.   started her on Synthroid.  MRI of the brain shows generalized atrophy.  Mental status appears to be at baseline now.  Active Problems: Bipolar disorder/schizophrenia Resident of group home.  Not sure what her baseline mental status is.  Home meds have been resumed except for Ativan and Lyrica. Patient on monthly invega injections and  has been refused by several faciliitesdue to its cost. I finally was able to reach her psychiatrist on the phone on 6/24 who recommended switching  it to risperidal 3 mg daily.    ESRD (end stage renal disease) ([place]) Thrombosed AV graft.  Tunneled dialysis catheter placed by IR on 6/20.  Received hemodialysis after that.   Nephrology following.   Diabetes mellitus type 2 CBG stable.  Continue Lantus with sliding scale coverage.     Hyperkalemia Resolved with dialysis.    Anemia in ESRD (end-stage renal disease) ([place]) Stable.  Essential hypertension Stable.  Continue home meds      Code Status : full code  Family Communication  : none at bedside  Disposition Plan  : PT recommends assisted living.  difficult placement due to cost of invega. Finally reached her psychiatrist Chrissie Noa hedden phone [phone]) on 6/24 who recommended switching to risperidal. Pt will be accepted at [facility] for 6/26 after HD.     Consults  : Renal, surgery, IR  Procedures  : Right femoral catheter, MRI brain    Lab Results  Component Value Date   PLT 145 (L) 10/10/2017    Antibiotics  :    Anti-infectives (From admission, onward)   Start     Dose/Rate Route Frequency Ordered Stop   10/08/17 1400  ceFAZolin (ANCEF) IVPB 2g/100 mL premix  Status:  Discontinued     2 g 200 mL/hr over 30 Minutes Intravenous Every 8 hours 10/08/17 1224 10/08/17 1225   10/08/17 1230  ceFAZolin (ANCEF) IVPB 2g/100 mL premix     2 g 200 mL/hr over 30 Minutes Intravenous  Once 10/08/17 1225          Objective:   Vitals:   10/12/17 1405 10/12/17 1600 10/12/17 2116 10/13/17 0547  BP: 133/77 111/65 126/72 (!) 147/65  Pulse: 69 71 76 (!) 53  Resp: 18 18    Temp: 97.9 F (36.6 C) 98.4 F (36.9 C) 98.2 F (36.8 C) 98.3 F (36.8 C)  TempSrc: Oral Oral Oral Oral  SpO2: 100% 100% 99% 92%  Weight: 66 kg (145 lb 8.1 oz)   65.7 kg (144 lb 13.5 oz)  Height:        Wt Readings from Last 3 Encounters:  10/13/17 65.7 kg (144 lb 13.5 oz)  10/24/16 71.5 kg (157 lb 10.1 oz)     Intake/Output Summary (Last 24 hours) at  10/13/2017 1317 Last data filed at 10/12/2017 2236 Gross per 24 hour  Intake 240 ml  Output 1000 ml  Net -760 ml    Physical ecxam NAD  chest: clear b/l, TDC CVS: N S1&S2 GI: soft, NT, ND  musculoskeletal: warm, no edema      Data Review:    CBC Recent Labs  Lab 10/10/17 0617  WBC 5.8  HGB 10.8*  HCT 33.0*  PLT 145*  MCV 90.2  MCH 29.5  MCHC 32.7  RDW 15.4    Chemistries  Recent Labs  Lab 10/07/17 0555 10/10/17 0617 10/11/17 0610 10/12/17 0535  NA 138 134* 134* 135  K 4.4 3.5 3.9 3.9  CL 100* 93* 95* 95*  CO2 '27 26 26 28  ' GLUCOSE 121* 185* 137* 99  BUN 45* 35* 19 29*  CREATININE 8.26* 6.82* 5.06* 6.95*  CALCIUM 9.3 9.2 9.1 9.2   ------------------------------------------------------------------------------------------------------------------ No results for input(s): CHOL, HDL, LDLCALC, TRIG, CHOLHDL, LDLDIRECT in the last 72 hours.  Lab Results  Component Value Date   HGBA1C 6.5 (H) 10/21/2016   ------------------------------------------------------------------------------------------------------------------ No results for input(s): TSH, T4TOTAL, T3FREE, THYROIDAB in the last 72 hours.  Invalid input(s): FREET3 ------------------------------------------------------------------------------------------------------------------ No results for input(s): VITAMINB12, FOLATE, FERRITIN, TIBC, IRON, RETICCTPCT in the last 72 hours.  Coagulation profile No results for input(s): INR, PROTIME in the last 168 hours.  No results for input(s): DDIMER in the last 72 hours.  Cardiac Enzymes No results for input(s): CKMB, TROPONINI, MYOGLOBIN in the last 168 hours.  Invalid input(s): CK ------------------------------------------------------------------------------------------------------------------ No results found for: BNP  Inpatient Medications  Scheduled Meds: . aspirin EC  81  mg Oral Daily  . benztropine  1 mg Oral Daily  . buPROPion  300 mg Oral  Daily  . Chlorhexidine Gluconate Cloth  6 each Topical Q0600  . divalproex  500 mg Oral QHS  . fluPHENAZine  5 mg Oral Daily  . insulin aspart  0-9 Units Subcutaneous TID WC  . insulin glargine  5 Units Subcutaneous QHS  . latanoprost  1 drop Both Eyes QHS  . levothyroxine  25 mcg Oral QAC breakfast  . multivitamin with minerals  1 tablet Oral Daily  . polyethylene glycol  17 g Oral Daily  . risperiDONE  3 mg Oral QHS  . simvastatin  40 mg Oral Daily  . sodium bicarbonate  50 mEq Intravenous Once   Continuous Infusions: . sodium chloride    . sodium chloride    . sodium chloride    . sodium chloride    .  ceFAZolin (ANCEF) IV     PRN Meds:.sodium chloride, sodium chloride, sodium chloride, sodium chloride, acetaminophen **OR** acetaminophen, alteplase, alteplase, heparin, heparin, heparin, hydrALAZINE, lidocaine (PF), lidocaine-prilocaine, ondansetron **OR** ondansetron (ZOFRAN) IV, pentafluoroprop-tetrafluoroeth  Micro Results Recent Results (from the past 240 hour(s))  MRSA PCR Screening     Status: None   Collection Time: 10/05/17 12:44 AM  Result Value Ref Range Status   MRSA by PCR NEGATIVE NEGATIVE Final    Comment:        The GeneXpert MRSA Assay (FDA approved for NASAL specimens only), is one component of a comprehensive MRSA colonization surveillance program. It is not intended to diagnose MRSA infection nor to guide or monitor treatment for MRSA infections. Performed at [facility], [address], [place], [place] [address]     Radiology Reports Dg Chest 1 View  Result Date: 10/04/2017 CLINICAL DATA:  Patient fell today. Right leg pain. Sleeping more than usual. EXAM: CHEST  1 VIEW COMPARISON:  10/21/2016 FINDINGS: Right central venous catheter with tip over the right atrium. Additional catheter fragment demonstrated in the right axillary region. Shallow inspiration with elevation of the right hemidiaphragm. Cardiac enlargement with mild vascular congestion.  No edema or consolidation. Linear atelectasis in the lung bases. No blunting of costophrenic angles. No pneumothorax. Degenerative changes in the shoulders. IMPRESSION: Shallow inspiration with linear atelectasis in the lung bases. Cardiac enlargement with mild vascular congestion. No edema or consolidation. Electronically Signed   By: Lucienne Capers M.D.   On: 10/04/2017 21:05   Dg Pelvis 1-2 Views  Result Date: 10/04/2017 CLINICAL DATA:  Patient fell today. Right leg pain. Sleeping more than usual. EXAM: PELVIS - 1-2 VIEW COMPARISON:  None. FINDINGS: Mild degenerative changes in the hips. There is no evidence of pelvic fracture or diastasis. No pelvic bone lesions are seen. Vascular calcifications. Calcified phleboliths in the pelvis. IMPRESSION: No acute bony abnormalities identified. Electronically Signed   By: Lucienne Capers M.D.   On: 10/04/2017 21:04   Ct Head Wo Contrast  Result Date: 10/04/2017 CLINICAL DATA:  Altered mental status. EXAM: CT HEAD WITHOUT CONTRAST CT CERVICAL SPINE WITHOUT CONTRAST TECHNIQUE: Multidetector CT imaging of the head and cervical spine was performed following the standard protocol without intravenous contrast. Multiplanar CT image reconstructions of the cervical spine were also generated. COMPARISON:  Head CT dated 10/21/2016. FINDINGS: CT HEAD FINDINGS Brain: Again noted is generalized parenchymal atrophy with commensurate dilatation of the ventricles and sulci. Ventricles are stable in size. There is no mass, hemorrhage, edema or other evidence of acute parenchymal abnormality. No extra-axial hemorrhage. Vascular:  There are chronic calcified atherosclerotic changes of the large vessels at the skull base. No unexpected hyperdense vessel. Skull: Normal. Negative for fracture or focal lesion. Sinuses/Orbits: No acute finding. Other: None. CT CERVICAL SPINE FINDINGS Alignment: Mild scoliosis at the cervicothoracic junction. No evidence of acute vertebral body  subluxation. Skull base and vertebrae: No fracture line or displaced fracture fragment seen. No acute or suspicious osseous lesion. Soft tissues and spinal canal: No prevertebral fluid or swelling. No visible canal hematoma. Disc levels: Mild disc desiccation at C5-6. No significant central canal stenosis at any level. Upper chest: No acute or significant findings. Other: Bilateral carotid atherosclerosis. IMPRESSION: 1. No acute intracranial abnormality. No intracranial mass, hemorrhage or edema. 2. No acute or significant findings within the cervical spine. Mild degenerative change at the C5-6 level. 3. Carotid atherosclerosis. Electronically Signed   By: Franki Cabot M.D.   On: 10/04/2017 20:57   Ct Cervical Spine Wo Contrast  Result Date: 10/04/2017 CLINICAL DATA:  Altered mental status. EXAM: CT HEAD WITHOUT CONTRAST CT CERVICAL SPINE WITHOUT CONTRAST TECHNIQUE: Multidetector CT imaging of the head and cervical spine was performed following the standard protocol without intravenous contrast. Multiplanar CT image reconstructions of the cervical spine were also generated. COMPARISON:  Head CT dated 10/21/2016. FINDINGS: CT HEAD FINDINGS Brain: Again noted is generalized parenchymal atrophy with commensurate dilatation of the ventricles and sulci. Ventricles are stable in size. There is no mass, hemorrhage, edema or other evidence of acute parenchymal abnormality. No extra-axial hemorrhage. Vascular: There are chronic calcified atherosclerotic changes of the large vessels at the skull base. No unexpected hyperdense vessel. Skull: Normal. Negative for fracture or focal lesion. Sinuses/Orbits: No acute finding. Other: None. CT CERVICAL SPINE FINDINGS Alignment: Mild scoliosis at the cervicothoracic junction. No evidence of acute vertebral body subluxation. Skull base and vertebrae: No fracture line or displaced fracture fragment seen. No acute or suspicious osseous lesion. Soft tissues and spinal canal: No  prevertebral fluid or swelling. No visible canal hematoma. Disc levels: Mild disc desiccation at C5-6. No significant central canal stenosis at any level. Upper chest: No acute or significant findings. Other: Bilateral carotid atherosclerosis. IMPRESSION: 1. No acute intracranial abnormality. No intracranial mass, hemorrhage or edema. 2. No acute or significant findings within the cervical spine. Mild degenerative change at the C5-6 level. 3. Carotid atherosclerosis. Electronically Signed   By: Franki Cabot M.D.   On: 10/04/2017 20:57   Mr Brain Wo Contrast  Result Date: 10/06/2017 CLINICAL DATA:  Altered level of consciousness for 2 days EXAM: MRI HEAD WITHOUT CONTRAST TECHNIQUE: Multiplanar, multiecho pulse sequences of the brain and surrounding structures were obtained without intravenous contrast. COMPARISON:  Head CT from 2 days ago FINDINGS: Very motion degraded sagittal T1 and diffusion imaging was acquired. No detected infarct. There is atrophy with ventriculomegaly. No detected midline shift. The scan was terminated due to patient request. IMPRESSION: 1. Only severely motion degraded T1 and diffusion imaging could be acquired. 2. No detected infarct or other acute finding. 3. Generalized atrophy. Electronically Signed   By: Monte Fantasia M.D.   On: 10/06/2017 11:08   Ir Fluoro Guide Cv Line Left  Result Date: 10/08/2017 INDICATION: History of end-stage renal disease now with clotted dialysis graft. [facility] made for placement tunneled hemodialysis catheter. EXAM: TUNNELED CENTRAL VENOUS HEMODIALYSIS CATHETER PLACEMENT WITH ULTRASOUND AND FLUOROSCOPIC GUIDANCE MEDICATIONS: Ancef 2 gm IV . The antibiotic was given in an appropriate time interval prior to skin puncture. ANESTHESIA/SEDATION: Versed 1 mg IV; Fentanyl 50  mcg IV; Moderate Sedation Time: 13 minutes The patient was continuously monitored during the procedure by the interventional radiology nurse under my direct supervision. FLUOROSCOPY TIME:   Fluoroscopy Time: 18 seconds (3 mGy). COMPLICATIONS: None immediate. PROCEDURE: Informed written consent was obtained from the patient's family after a discussion of the risks, benefits, and alternatives to treatment. Questions regarding the procedure were encouraged and answered. The left neck and chest were prepped with chlorhexidine in a sterile fashion, and a sterile drape was applied covering the operative field. Maximum barrier sterile technique with sterile gowns and gloves were used for the procedure. A timeout was performed prior to the initiation of the procedure. After creating a small venotomy incision, a micropuncture kit was utilized to access the internal jugular vein. Real-time ultrasound guidance was utilized for vascular access including the acquisition of a permanent ultrasound image documenting patency of the accessed vessel. The microwire was utilized to measure appropriate catheter length. A stiff Glidewire was advanced to the level of the IVC and the micropuncture sheath was exchanged for a peel-away sheath. A palindrome tunneled hemodialysis catheter measuring 19 cm from tip to cuff was tunneled in a retrograde fashion from the anterior chest wall to the venotomy incision. The catheter was then placed through the peel-away sheath with tips ultimately positioned within the superior aspect of the right atrium. Final catheter positioning was confirmed and documented with a spot radiographic image. Note is made of an abandoned right chest wall hero graft. The catheter aspirates and flushes normally. The catheter was flushed with appropriate volume heparin dwells. The catheter exit site was secured with a 0-Prolene retention suture. The venotomy incision was closed with an interrupted 4-0 Vicryl, Dermabond and Steri-strips. Dressings were applied. The patient tolerated the procedure well without immediate post procedural complication. IMPRESSION: Successful placement of 19 cm tip to cuff tunneled  hemodialysis catheter via the left internal jugular vein with tips terminating within the superior aspect of the right atrium. The catheter is ready for immediate use. Electronically Signed   By: Sandi Mariscal M.D.   On: 10/08/2017 13:11   Ir US Guide Vasc Access Left  Result Date: 10/08/2017 INDICATION: History of end-stage renal disease now with clotted dialysis graft. [facility] made for placement tunneled hemodialysis catheter. EXAM: TUNNELED CENTRAL VENOUS HEMODIALYSIS CATHETER PLACEMENT WITH ULTRASOUND AND FLUOROSCOPIC GUIDANCE MEDICATIONS: Ancef 2 gm IV . The antibiotic was given in an appropriate time interval prior to skin puncture. ANESTHESIA/SEDATION: Versed 1 mg IV; Fentanyl 50 mcg IV; Moderate Sedation Time: 13 minutes The patient was continuously monitored during the procedure by the interventional radiology nurse under my direct supervision. FLUOROSCOPY TIME:  Fluoroscopy Time: 18 seconds (3 mGy). COMPLICATIONS: None immediate. PROCEDURE: Informed written consent was obtained from the patient's family after a discussion of the risks, benefits, and alternatives to treatment. Questions regarding the procedure were encouraged and answered. The left neck and chest were prepped with chlorhexidine in a sterile fashion, and a sterile drape was applied covering the operative field. Maximum barrier sterile technique with sterile gowns and gloves were used for the procedure. A timeout was performed prior to the initiation of the procedure. After creating a small venotomy incision, a micropuncture kit was utilized to access the internal jugular vein. Real-time ultrasound guidance was utilized for vascular access including the acquisition of a permanent ultrasound image documenting patency of the accessed vessel. The microwire was utilized to measure appropriate catheter length. A stiff Glidewire was advanced to the level of the IVC and the  micropuncture sheath was exchanged for a peel-away sheath. A palindrome  tunneled hemodialysis catheter measuring 19 cm from tip to cuff was tunneled in a retrograde fashion from the anterior chest wall to the venotomy incision. The catheter was then placed through the peel-away sheath with tips ultimately positioned within the superior aspect of the right atrium. Final catheter positioning was confirmed and documented with a spot radiographic image. Note is made of an abandoned right chest wall hero graft. The catheter aspirates and flushes normally. The catheter was flushed with appropriate volume heparin dwells. The catheter exit site was secured with a 0-Prolene retention suture. The venotomy incision was closed with an interrupted 4-0 Vicryl, Dermabond and Steri-strips. Dressings were applied. The patient tolerated the procedure well without immediate post procedural complication. IMPRESSION: Successful placement of 19 cm tip to cuff tunneled hemodialysis catheter via the left internal jugular vein with tips terminating within the superior aspect of the right atrium. The catheter is ready for immediate use. Electronically Signed   By: Sandi Mariscal M.D.   On: 10/08/2017 13:11   Dg Femur Portable Min 2 Views Right  Result Date: 10/04/2017 CLINICAL DATA:  Per ED notes: [facility] reports that pt fell today, pt from [facility] and that pt had some right leg pain, pt denies pain at present. Reported that has been sleeping more than usual. EXAM: RIGHT FEMUR PORTABLE 2 VIEW COMPARISON:  None. FINDINGS: No fracture RIGHT femur. No dislocation. Atherosclerotic calcification noted. IMPRESSION: No fracture or dislocation. Electronically Signed   By: Suzy Bouchard M.D.   On: 10/04/2017 21:02    Time Spent in minutes  20   Nakea Gouger M.D on 10/13/2017 at 1:17 PM  Between 7am to 7pm - Pager - [phone]  After 7pm go to [email or web address] - password [facility]  [facility] -  Office  [phone]

## 2017-10-13 NOTE — Care Management Note (Signed)
Case Management Note  Patient Details  Name: [name] MRN: [id number] Date of Birth: Mar 28, 1961   If discussed at [place] Length of Stay Meetings, dates discussed:   10/13/2017 Additional Comments:  Erick Murin, Chauncey Reading, RN 10/13/2017, 12:14 PM

## 2017-10-13 NOTE — Progress Notes (Signed)
PT Cancellation Note  Patient Details Name: ANORA SCHWENKE MRN: [id number] DOB: 11-Jun-1960   Cancelled Treatment:    Reason Eval/Treat Not Completed: Patient declined, no reason specified Attempted PT session, pt stated she doesn't wish to complete therapy today, wishes to eat her meal.  Will attempt later today if available.  [address] LPTA; [place]   Aldona Lento 10/13/2017, 2:24 PM

## 2017-10-14 DIAGNOSIS — T82868D Thrombosis of vascular prosthetic devices, implants and grafts, subsequent encounter: Secondary | ICD-10-CM

## 2017-10-14 DIAGNOSIS — R4182 Altered mental status, unspecified: Secondary | ICD-10-CM

## 2017-10-14 DIAGNOSIS — D631 Anemia in chronic kidney disease: Secondary | ICD-10-CM

## 2017-10-14 DIAGNOSIS — E875 Hyperkalemia: Secondary | ICD-10-CM

## 2017-10-14 DIAGNOSIS — I10 Essential (primary) hypertension: Secondary | ICD-10-CM

## 2017-10-14 DIAGNOSIS — Z992 Dependence on renal dialysis: Secondary | ICD-10-CM

## 2017-10-14 DIAGNOSIS — F319 Bipolar disorder, unspecified: Secondary | ICD-10-CM

## 2017-10-14 DIAGNOSIS — Z794 Long term (current) use of insulin: Secondary | ICD-10-CM

## 2017-10-14 DIAGNOSIS — E1122 Type 2 diabetes mellitus with diabetic chronic kidney disease: Secondary | ICD-10-CM

## 2017-10-14 LAB — GLUCOSE, CAPILLARY
GLUCOSE-CAPILLARY: 294 mg/dL — AB (ref 70–99)
Glucose-Capillary: 133 mg/dL — ABNORMAL HIGH (ref 70–99)

## 2017-10-14 MED ORDER — RISPERIDONE 3 MG PO TABS: 3.0000 mg | ORAL_TABLET | Freq: Every day | ORAL | Status: DC

## 2017-10-14 MED ORDER — LEVOTHYROXINE SODIUM 25 MCG PO TABS: 25.0000 ug | ORAL_TABLET | Freq: Every day | ORAL | Status: DC

## 2017-10-14 MED ORDER — HEPARIN SODIUM (PORCINE) 1000 UNIT/ML IJ SOLN
INTRAMUSCULAR | Status: AC
  Filled 2017-10-14: qty 1

## 2017-10-14 NOTE — NC FL2 (Signed)
[place] LEVEL OF CARE SCREENING TOOL     IDENTIFICATION  Patient Name: [name] Birthdate: [birth year] Sex: female Admission Date (Current Location): 10/04/2017  [place] and [place] Number:  [facility] [id number] [place] [address] and Address:  [facility] [address] [place]      Provider Number: [id number]  Attending Physician Name and Address:  Barton Dubois, MD  Relative Name and Phone Number:  Evalisse Prajapati (brother) [phone]    Current Level of Care: Hospital Recommended Level of Care: [place] Prior Approval Number:    Date Approved/Denied:   PASRR Number: [id number] (expires 01/05/2018)  Discharge Plan: SNF    Current Diagnoses: Patient Active Problem List   Diagnosis Date Noted  . 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 Schizophrenia ([place]) 10/04/2017  . Hyperkalemia 10/04/2017  . Anemia in ESRD (end-stage renal disease) ([place]) 10/04/2017  . Dysphasia   . Altered mental status 10/22/2016  . ESRD (end stage renal disease) ([place]) 10/22/2016  . Bipolar 1 disorder ([place]) 10/22/2016  . Diabetes mellitus ([place]) 10/22/2016  . Essential hypertension 10/22/2016  . Acute encephalopathy 10/22/2016    Orientation RESPIRATION BLADDER Height & Weight     Place  Normal Continent Weight: 144 lb 10 oz (65.6 kg) Height:  5' (152.4 cm)  BEHAVIORAL SYMPTOMS/MOOD NEUROLOGICAL BOWEL NUTRITION STATUS      Continent Diet(see dc summary)  AMBULATORY STATUS COMMUNICATION OF NEEDS Skin   Limited Assist Verbally Normal                       Personal Care Assistance Level of Assistance  Bathing, Feeding, Dressing Bathing Assistance: Limited assistance Feeding assistance: Independent Dressing Assistance: Limited assistance     Functional Limitations Info  Sight, Hearing, Speech Sight Info: Adequate Hearing Info: Adequate Speech Info: Adequate    SPECIAL CARE FACTORS FREQUENCY                        Contractures Contractures Info: Not present    Additional Factors Info  Psychotropic Code Status Info: full Allergies Info: No Known Allergies Psychotropic Info: Wellbutrin, Depakote, Ativan, Risperidone         Current Medications (10/14/2017):  This is the current hospital active medication list Current Facility-Administered Medications  Medication Dose Route Frequency Provider Last Rate Last Dose  . 0.9 %  sodium chloride infusion  100 mL Intravenous PRN Befekadu, Eli Phillips, MD      . 0.9 %  sodium chloride infusion  100 mL Intravenous PRN Befekadu, Belayenh, MD      . 0.9 %  sodium chloride infusion  100 mL Intravenous PRN Befekadu, Belayenh, MD      . 0.9 %  sodium chloride infusion  100 mL Intravenous PRN Fran Lowes, MD      . acetaminophen (TYLENOL) tablet 650 mg  650 mg Oral Q6H PRN Reubin Milan, MD       Or  . acetaminophen (TYLENOL) suppository 650 mg  650 mg Rectal Q6H PRN Reubin Milan, MD      . alteplase (CATHFLO ACTIVASE) injection 2 mg  2 mg Intracatheter Once PRN Fran Lowes, MD      . alteplase (CATHFLO ACTIVASE) injection 2 mg  2 mg Intracatheter Once PRN Fran Lowes, MD      . aspirin EC tablet 81 mg  81 mg Oral Daily Dhungel, Nishant, MD   81 mg at 10/14/17 1010  .  benztropine (COGENTIN) tablet 1 mg  1 mg Oral Daily Dhungel, Nishant, MD   1 mg at 10/14/17 1010  . buPROPion (WELLBUTRIN XL) 24 hr tablet 300 mg  300 mg Oral Daily Dhungel, Nishant, MD   300 mg at 10/14/17 1010  . ceFAZolin (ANCEF) IVPB 2g/100 mL premix  2 g Intravenous Once Sandi Mariscal, MD      . Chlorhexidine Gluconate Cloth 2 % PADS 6 each  6 each Topical Q0600 Fran Lowes, MD   6 each at 10/14/17 0612  . divalproex (DEPAKOTE) DR tablet 500 mg  500 mg Oral QHS Dhungel, Nishant, MD   500 mg at 10/13/17 2222  . fluPHENAZine (PROLIXIN) tablet 5 mg  5 mg Oral Daily Dhungel, Nishant, MD   5 mg at 10/14/17 1010  . heparin injection 1,000 Units  1,000  Units Dialysis PRN Fran Lowes, MD   3,200 Units at 10/10/17 1524  . heparin injection 1,000 Units  1,000 Units Dialysis PRN Fran Lowes, MD      . heparin injection 1,400 Units  20 Units/kg Dialysis PRN Fran Lowes, MD   1,400 Units at 10/10/17 1140  . hydrALAZINE (APRESOLINE) injection 10 mg  10 mg Intravenous Q4H PRN Reubin Milan, MD   10 mg at 10/10/17 0053  . insulin aspart (novoLOG) injection 0-9 Units  0-9 Units Subcutaneous TID WC Reubin Milan, MD   1 Units at 10/14/17 [phone]  . insulin glargine (LANTUS) injection 5 Units  5 Units Subcutaneous QHS Reubin Milan, MD   5 Units at 10/13/17 2223  . latanoprost (XALATAN) 0.005 % ophthalmic solution 1 drop  1 drop Both Eyes QHS Reubin Milan, MD   1 drop at 10/13/17 2223  . levothyroxine (SYNTHROID, LEVOTHROID) tablet 25 mcg  25 mcg Oral QAC breakfast Dhungel, Nishant, MD   25 mcg at 10/14/17 1009  . lidocaine (PF) (XYLOCAINE) 1 % injection 5 mL  5 mL Intradermal PRN Fran Lowes, MD      . lidocaine-prilocaine (EMLA) cream 1 application  1 application Topical PRN Fran Lowes, MD      . multivitamin with minerals tablet 1 tablet  1 tablet Oral Daily Dhungel, Nishant, MD   1 tablet at 10/14/17 1010  . ondansetron (ZOFRAN) tablet 4 mg  4 mg Oral Q6H PRN Reubin Milan, MD   4 mg at 10/06/17 2118   Or  . ondansetron [facility]) injection 4 mg  4 mg Intravenous Q6H PRN Reubin Milan, MD   4 mg at 10/10/17 0523  . pentafluoroprop-tetrafluoroeth (GEBAUERS) aerosol 1 application  1 application Topical PRN Befekadu, Eli Phillips, MD      . polyethylene glycol (MIRALAX / GLYCOLAX) packet 17 g  17 g Oral Daily Dhungel, Nishant, MD   17 g at 10/14/17 1010  . risperiDONE (RISPERDAL) tablet 3 mg  3 mg Oral QHS Dhungel, Nishant, MD   3 mg at 10/13/17 2222  . simvastatin (ZOCOR) tablet 40 mg  40 mg Oral Daily Dhungel, Nishant, MD   40 mg at 10/14/17 1010  . sodium bicarbonate injection 50 mEq  50 mEq  Intravenous Once Reubin Milan, MD         Discharge Medications: Please see discharge summary for a list of discharge medications.  Relevant Imaging Results:  Relevant Lab Results:   Additional Information SSN: [id number] [place]

## 2017-10-14 NOTE — Procedures (Signed)
     HEMODIALYSIS TREATMENT NOTE:   3.5 hour heparin-free dialysis completed via left IJ tunneled catheter. Exit site unremarkable. Goal met: 1.5L removed without interruption in ultrafiltration.  All blood was returned.  Report given to Threasa Alpha, RN.   Rockwell Alexandria, RN, CDN

## 2017-10-14 NOTE — Discharge Summary (Signed)
Physician Discharge Summary  [name] IEP:[id number] DOB: 09/17/1960 DOA: 10/04/2017  PCP: Patient, No Pcp Per  Admit date: 10/04/2017 Discharge date: 10/14/2017  Time spent: 35 minutes  Recommendations for Outpatient Follow-up:  1. Repeat basic metabolic panel to follow renal function and electrolytes 2. Repeat CBC to follow hemoglobin trend 3. In 6 weeks repeat thyroid panel and further adjust Synthroid dose as needed.    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 Discharge to [facility] for further care and rehabilitation.  Diet recommendation: modified carb diet and renal diet.  Filed Weights   10/13/17 0547 10/14/17 0725 10/14/17 1125  Weight: 65.7 kg (144 lb 13.5 oz) 65.6 kg (144 lb 10 oz) 65.8 kg (145 lb 1 oz)    History of present illness:  As per H&P written by Dr. Olevia Bowens on 10/04/2017. 57 y.o. female with medical history significant of bipolar 1 disorder, schizophrenia, hypertension, type 2 diabetes who is brought to the emergency department from the [facility] family care home due to altered mental status.  She was seen recently, on Friday, at [facility] for AV fistula thrombosis.  She was reported to be acting normally Saturday and early Sunday morning.  She had a fall earlier today.  No further history is available at this time.    ED Course: Initial vital signs temperature 98 F, pulse 54, respirations 12, blood pressure 195/80 mmHg and O2 sat 100%.  She received 50 mEq of sodium bicarbonate and 1 g of calcium gluconate.  Hospital Course:  1-acute metabolic encephalopathy: In the setting of uremia with  underlying bipolar disorder/schizophrenia and multiple psychotropic medications. -Patient is now at baseline -Patient to continue hemodialysis as previously scheduled Tuesday, Thursday and Saturday -We will resume all her psych medications with exception of Ativan and Lyrica; she also had her Invega changed to risperdal daily instead. -outpatient follow up with psychiatrist   2-ESRD -resume T-T-S at discharge -electrolytes stable  -patient with thrombosed AV graft anf now receiving treatment through tunneled HD catheter on her left subclavian.  3-type 2 diabetes with ESRD -resume home insulin therapy -close outpatient follow up to CBG's and A1C; further adjustment to hypoglycemic regimen as needed.  4-Hyperkalemia -resolved with HD -electrolytes stable at discharge   5-anemia os ESRD -IV iron and epogen therapy as per renal service discretion -no signs of acute bleeding  -Hgb stable  6-essential HTN -stable and well controlled -continue low sodium diet (renal diet) -continue home antihypertensive regimen   7-bipolar disorder/schizophrenia  -as mentioned above will resume homeme psych meds, except for ativan and Lyrica -patient's Invega substitute by risperdal -no SI or hallucinations  -continue outpatient follow up with psychiatry   8-hypothyroidism -continue synthroid -assess Thyroid profile in 6 weeks.   Procedures:  See below for x-ray reports   Inpatient HD  Consultations:  Renal service   General surgery   IR  Discharge Exam: Vitals:   10/14/17 1200 10/14/17 1230  BP: (!) 146/73 100/64  Pulse: 70 83  Resp:    Temp:    SpO2:      General: Afebrile, no chest pain, no nausea, no vomiting.  Mentation back  to baseline. Cardiovascular: No murmurs, no gallops, no rubs; no JVD. Respiratory: Good air movement bilateral, no wheezing, no crackles. Abdomen: Soft, nontender, nondistended, positive BS. Skin: tunneled cath left subclavian in place; no  surrounded erythema appreciated; patient reported mild soreness.    Discharge Instructions   Discharge Instructions    Diet - low sodium heart healthy   Complete by:  As directed    Discharge instructions   Complete by:  As directed    Provide medications as prescribed. Maintain adequate hydration. Rehabilitation and further care as per skilled facility protocol. Follow-up with primary care in 2 weeks.     Allergies as of 10/14/2017   No Known Allergies     Medication List    STOP taking these medications   INVEGA SUSTENNA 234 MG/1.5ML Susp injection Generic drug:  paliperidone   LORazepam 0.5 MG tablet Commonly known as:  ATIVAN   LYRICA 50 MG capsule Generic drug:  pregabalin   oxyCODONE-acetaminophen 5-325 MG tablet Commonly known as:  PERCOCET/ROXICET     TAKE these medications   acetaminophen 500 MG tablet Commonly known as:  TYLENOL Take 500 mg by mouth every 6 (six) hours as needed.   aspirin EC 81 MG tablet Take 81 mg by mouth daily.   benztropine 1 MG tablet Commonly known as:  COGENTIN Take 1 mg by mouth.   buPROPion 300 MG 24 hr tablet Commonly known as:  WELLBUTRIN XL Take 300 mg by mouth daily.   divalproex 500 MG DR tablet Commonly known as:  DEPAKOTE Take 500 mg by mouth at bedtime.   docusate sodium 100 MG capsule Commonly known as:  COLACE Take 100 mg by mouth.   fluPHENAZine 5 MG tablet Commonly known as:  PROLIXIN Take 5 mg by mouth.   insulin glargine 100 UNIT/ML injection Commonly known as:  LANTUS Inject 0.05 mLs (5 Units total) into the skin at bedtime.   insulin regular 100 units/mL injection Commonly known as:  NOVOLIN R,HUMULIN R Inject into the skin 3 (three) times daily before meals. >200-249 = 2 250-299=4 300-349=6 350-399=8 400-449=10 250 402 6096=12 >500 ems   latanoprost 0.005 % ophthalmic solution Commonly known as:  XALATAN Place 1 drop into both eyes at bedtime.   levothyroxine 25 MCG tablet Commonly known as:   SYNTHROID, LEVOTHROID Take 1 tablet (25 mcg total) by mouth daily before breakfast. Start taking on:  10/15/2017   multivitamin with minerals Tabs tablet Take 1 tablet by mouth daily.   risperiDONE 3 MG tablet Commonly known as:  RISPERDAL Take 1 tablet (3 mg total) by mouth at bedtime.   sevelamer carbonate 800 MG tablet Commonly known as:  RENVELA Take 800 mg by mouth 3 (three) times daily with meals.   simvastatin 40 MG tablet Commonly known as:  ZOCOR Take 40 mg by mouth daily.            Durable Medical Equipment  (From admission, onward)        Start     Ordered   10/09/17 0827  For home use only DME Walker rolling  Once    Question:  Patient needs a walker to treat with the following condition  Answer:  Weakness   10/09/17 0827     No Known Allergies   The results of significant diagnostics from this hospitalization (including imaging, microbiology, ancillary and laboratory) are listed below for reference.    Significant Diagnostic Studies: Dg Chest 1 View  Result Date: 10/04/2017 CLINICAL DATA:  Patient fell  today. Right leg pain. Sleeping more than usual. EXAM: CHEST  1 VIEW COMPARISON:  10/21/2016 FINDINGS: Right central venous catheter with tip over the right atrium. Additional catheter fragment demonstrated in the right axillary region. Shallow inspiration with elevation of the right hemidiaphragm. Cardiac enlargement with mild vascular congestion. No edema or consolidation. Linear atelectasis in the lung bases. No blunting of costophrenic angles. No pneumothorax. Degenerative changes in the shoulders. IMPRESSION: Shallow inspiration with linear atelectasis in the lung bases. Cardiac enlargement with mild vascular congestion. No edema or consolidation. Electronically Signed   By: Lucienne Capers M.D.   On: 10/04/2017 21:05   Dg Pelvis 1-2 Views  Result Date: 10/04/2017 CLINICAL DATA:  Patient fell today. Right leg pain. Sleeping more than usual. EXAM:  PELVIS - 1-2 VIEW COMPARISON:  None. FINDINGS: Mild degenerative changes in the hips. There is no evidence of pelvic fracture or diastasis. No pelvic bone lesions are seen. Vascular calcifications. Calcified phleboliths in the pelvis. IMPRESSION: No acute bony abnormalities identified. Electronically Signed   By: Lucienne Capers M.D.   On: 10/04/2017 21:04   Ct Head Wo Contrast  Result Date: 10/04/2017 CLINICAL DATA:  Altered mental status. EXAM: CT HEAD WITHOUT CONTRAST CT CERVICAL SPINE WITHOUT CONTRAST TECHNIQUE: Multidetector CT imaging of the head and cervical spine was performed following the standard protocol without intravenous contrast. Multiplanar CT image reconstructions of the cervical spine were also generated. COMPARISON:  Head CT dated 10/21/2016. FINDINGS: CT HEAD FINDINGS Brain: Again noted is generalized parenchymal atrophy with commensurate dilatation of the ventricles and sulci. Ventricles are stable in size. There is no mass, hemorrhage, edema or other evidence of acute parenchymal abnormality. No extra-axial hemorrhage. Vascular: There are chronic calcified atherosclerotic changes of the large vessels at the skull base. No unexpected hyperdense vessel. Skull: Normal. Negative for fracture or focal lesion. Sinuses/Orbits: No acute finding. Other: None. CT CERVICAL SPINE FINDINGS Alignment: Mild scoliosis at the cervicothoracic junction. No evidence of acute vertebral body subluxation. Skull base and vertebrae: No fracture line or displaced fracture fragment seen. No acute or suspicious osseous lesion. Soft tissues and spinal canal: No prevertebral fluid or swelling. No visible canal hematoma. Disc levels: Mild disc desiccation at C5-6. No significant central canal stenosis at any level. Upper chest: No acute or significant findings. Other: Bilateral carotid atherosclerosis. IMPRESSION: 1. No acute intracranial abnormality. No intracranial mass, hemorrhage or edema. 2. No acute or  significant findings within the cervical spine. Mild degenerative change at the C5-6 level. 3. Carotid atherosclerosis. Electronically Signed   By: Franki Cabot M.D.   On: 10/04/2017 20:57   Ct Cervical Spine Wo Contrast  Result Date: 10/04/2017 CLINICAL DATA:  Altered mental status. EXAM: CT HEAD WITHOUT CONTRAST CT CERVICAL SPINE WITHOUT CONTRAST TECHNIQUE: Multidetector CT imaging of the head and cervical spine was performed following the standard protocol without intravenous contrast. Multiplanar CT image reconstructions of the cervical spine were also generated. COMPARISON:  Head CT dated 10/21/2016. FINDINGS: CT HEAD FINDINGS Brain: Again noted is generalized parenchymal atrophy with commensurate dilatation of the ventricles and sulci. Ventricles are stable in size. There is no mass, hemorrhage, edema or other evidence of acute parenchymal abnormality. No extra-axial hemorrhage. Vascular: There are chronic calcified atherosclerotic changes of the large vessels at the skull base. No unexpected hyperdense vessel. Skull: Normal. Negative for fracture or focal lesion. Sinuses/Orbits: No acute finding. Other: None. CT CERVICAL SPINE FINDINGS Alignment: Mild scoliosis at the cervicothoracic junction. No evidence of acute vertebral body subluxation. Skull  base and vertebrae: No fracture line or displaced fracture fragment seen. No acute or suspicious osseous lesion. Soft tissues and spinal canal: No prevertebral fluid or swelling. No visible canal hematoma. Disc levels: Mild disc desiccation at C5-6. No significant central canal stenosis at any level. Upper chest: No acute or significant findings. Other: Bilateral carotid atherosclerosis. IMPRESSION: 1. No acute intracranial abnormality. No intracranial mass, hemorrhage or edema. 2. No acute or significant findings within the cervical spine. Mild degenerative change at the C5-6 level. 3. Carotid atherosclerosis. Electronically Signed   By: Franki Cabot M.D.    On: 10/04/2017 20:57   Mr Brain Wo Contrast  Result Date: 10/06/2017 CLINICAL DATA:  Altered level of consciousness for 2 days EXAM: MRI HEAD WITHOUT CONTRAST TECHNIQUE: Multiplanar, multiecho pulse sequences of the brain and surrounding structures were obtained without intravenous contrast. COMPARISON:  Head CT from 2 days ago FINDINGS: Very motion degraded sagittal T1 and diffusion imaging was acquired. No detected infarct. There is atrophy with ventriculomegaly. No detected midline shift. The scan was terminated due to patient request. IMPRESSION: 1. Only severely motion degraded T1 and diffusion imaging could be acquired. 2. No detected infarct or other acute finding. 3. Generalized atrophy. Electronically Signed   By: Monte Fantasia M.D.   On: 10/06/2017 11:08   Ir Fluoro Guide Cv Line Left  Result Date: 10/08/2017 INDICATION: History of end-stage renal disease now with clotted dialysis graft. [facility] made for placement tunneled hemodialysis catheter. EXAM: TUNNELED CENTRAL VENOUS HEMODIALYSIS CATHETER PLACEMENT WITH ULTRASOUND AND FLUOROSCOPIC GUIDANCE MEDICATIONS: Ancef 2 gm IV . The antibiotic was given in an appropriate time interval prior to skin puncture. ANESTHESIA/SEDATION: Versed 1 mg IV; Fentanyl 50 mcg IV; Moderate Sedation Time: 13 minutes The patient was continuously monitored during the procedure by the interventional radiology nurse under my direct supervision. FLUOROSCOPY TIME:  Fluoroscopy Time: 18 seconds (3 mGy). COMPLICATIONS: None immediate. PROCEDURE: Informed written consent was obtained from the patient's family after a discussion of the risks, benefits, and alternatives to treatment. Questions regarding the procedure were encouraged and answered. The left neck and chest were prepped with chlorhexidine in a sterile fashion, and a sterile drape was applied covering the operative field. Maximum barrier sterile technique with sterile gowns and gloves were used for the procedure. A  timeout was performed prior to the initiation of the procedure. After creating a small venotomy incision, a micropuncture kit was utilized to access the internal jugular vein. Real-time ultrasound guidance was utilized for vascular access including the acquisition of a permanent ultrasound image documenting patency of the accessed vessel. The microwire was utilized to measure appropriate catheter length. A stiff Glidewire was advanced to the level of the IVC and the micropuncture sheath was exchanged for a peel-away sheath. A palindrome tunneled hemodialysis catheter measuring 19 cm from tip to cuff was tunneled in a retrograde fashion from the anterior chest wall to the venotomy incision. The catheter was then placed through the peel-away sheath with tips ultimately positioned within the superior aspect of the right atrium. Final catheter positioning was confirmed and documented with a spot radiographic image. Note is made of an abandoned right chest wall hero graft. The catheter aspirates and flushes normally. The catheter was flushed with appropriate volume heparin dwells. The catheter exit site was secured with a 0-Prolene retention suture. The venotomy incision was closed with an interrupted 4-0 Vicryl, Dermabond and Steri-strips. Dressings were applied. The patient tolerated the procedure well without immediate post procedural complication. IMPRESSION: Successful placement  of 19 cm tip to cuff tunneled hemodialysis catheter via the left internal jugular vein with tips terminating within the superior aspect of the right atrium. The catheter is ready for immediate use. Electronically Signed   By: Sandi Mariscal M.D.   On: 10/08/2017 13:11   Ir US Guide Vasc Access Left  Result Date: 10/08/2017 INDICATION: History of end-stage renal disease now with clotted dialysis graft. [facility] made for placement tunneled hemodialysis catheter. EXAM: TUNNELED CENTRAL VENOUS HEMODIALYSIS CATHETER PLACEMENT WITH ULTRASOUND AND  FLUOROSCOPIC GUIDANCE MEDICATIONS: Ancef 2 gm IV . The antibiotic was given in an appropriate time interval prior to skin puncture. ANESTHESIA/SEDATION: Versed 1 mg IV; Fentanyl 50 mcg IV; Moderate Sedation Time: 13 minutes The patient was continuously monitored during the procedure by the interventional radiology nurse under my direct supervision. FLUOROSCOPY TIME:  Fluoroscopy Time: 18 seconds (3 mGy). COMPLICATIONS: None immediate. PROCEDURE: Informed written consent was obtained from the patient's family after a discussion of the risks, benefits, and alternatives to treatment. Questions regarding the procedure were encouraged and answered. The left neck and chest were prepped with chlorhexidine in a sterile fashion, and a sterile drape was applied covering the operative field. Maximum barrier sterile technique with sterile gowns and gloves were used for the procedure. A timeout was performed prior to the initiation of the procedure. After creating a small venotomy incision, a micropuncture kit was utilized to access the internal jugular vein. Real-time ultrasound guidance was utilized for vascular access including the acquisition of a permanent ultrasound image documenting patency of the accessed vessel. The microwire was utilized to measure appropriate catheter length. A stiff Glidewire was advanced to the level of the IVC and the micropuncture sheath was exchanged for a peel-away sheath. A palindrome tunneled hemodialysis catheter measuring 19 cm from tip to cuff was tunneled in a retrograde fashion from the anterior chest wall to the venotomy incision. The catheter was then placed through the peel-away sheath with tips ultimately positioned within the superior aspect of the right atrium. Final catheter positioning was confirmed and documented with a spot radiographic image. Note is made of an abandoned right chest wall hero graft. The catheter aspirates and flushes normally. The catheter was flushed with  appropriate volume heparin dwells. The catheter exit site was secured with a 0-Prolene retention suture. The venotomy incision was closed with an interrupted 4-0 Vicryl, Dermabond and Steri-strips. Dressings were applied. The patient tolerated the procedure well without immediate post procedural complication. IMPRESSION: Successful placement of 19 cm tip to cuff tunneled hemodialysis catheter via the left internal jugular vein with tips terminating within the superior aspect of the right atrium. The catheter is ready for immediate use. Electronically Signed   By: Sandi Mariscal M.D.   On: 10/08/2017 13:11   Dg Femur Portable Min 2 Views Right  Result Date: 10/04/2017 CLINICAL DATA:  Per ED notes: [facility] reports that pt fell today, pt from [facility] and that pt had some right leg pain, pt denies pain at present. Reported that has been sleeping more than usual. EXAM: RIGHT FEMUR PORTABLE 2 VIEW COMPARISON:  None. FINDINGS: No fracture RIGHT femur. No dislocation. Atherosclerotic calcification noted. IMPRESSION: No fracture or dislocation. Electronically Signed   By: Suzy Bouchard M.D.   On: 10/04/2017 21:02   Microbiology: Recent Results (from the past 240 hour(s))  MRSA PCR Screening     Status: None   Collection Time: 10/05/17 12:44 AM  Result Value Ref Range Status   MRSA by PCR  NEGATIVE NEGATIVE Final    Comment:        The GeneXpert MRSA Assay (FDA approved for NASAL specimens only), is one component of a comprehensive MRSA colonization surveillance program. It is not intended to diagnose MRSA infection nor to guide or monitor treatment for MRSA infections. Performed at [facility], [address] [place], [place] [address]      Labs: Basic Metabolic Panel: Recent Labs  Lab 10/10/17 0617 10/11/17 0610 10/12/17 0535  NA 134* 134* 135  K 3.5 3.9 3.9  CL 93* 95* 95*  CO2 '26 26 28  ' GLUCOSE 185* 137* 99  BUN 35* 19 29*  CREATININE 6.82* 5.06* 6.95*  CALCIUM 9.2 9.1  9.2  PHOS 5.8* 5.1* 5.5*   Liver Function Tests: Recent Labs  Lab 10/10/17 0617 10/11/17 0610 10/12/17 0535  ALBUMIN 3.1* 3.0* 3.0*   CBC: Recent Labs  Lab 10/10/17 0617  WBC 5.8  HGB 10.8*  HCT 33.0*  MCV 90.2  PLT 145*    CBG: Recent Labs  Lab 10/13/17 1216 10/13/17 1625 10/13/17 2033 10/14/17 0737 10/14/17 1107  GLUCAP 250* 198* 228* 133* 294*    Signed:  Barton Dubois MD.  [facility] 10/14/2017, 12:43 PM

## 2017-10-14 NOTE — Clinical Social Work Placement (Signed)
   CLINICAL SOCIAL WORK PLACEMENT  NOTE  Date:  10/14/2017  Patient Details  Name: [name] MRN: [id number] Date of Birth: [birth year]  Clinical Social Work is seeking post-discharge placement for this patient at the [place] level of care (*CSW will initial, date and re-position this form in  chart as items are completed):  Yes   Patient/family provided with [place] Work Department's list of facilities offering this level of care within the geographic area requested by the patient (or if unable, by the patient's family).  Yes   Patient/family informed of their freedom to choose among providers that offer the needed level of care, that participate in Medicare, Medicaid or managed care program needed by the patient, have an available bed and are willing to accept the patient.  Yes   Patient/family informed of [facility]'s ownership interest in [facility], as well as of the fact that they are under no obligation to receive care at these facilities.  PASRR submitted to EDS on 10/05/17     PASRR number received on 10/07/17     Existing PASRR number confirmed on       FL2 transmitted to all facilities in geographic area requested by pt/family on 10/05/17     FL2 transmitted to all facilities within larger geographic area on       Patient informed that his/her managed care company has contracts with or will negotiate with certain facilities, including the following:        Yes   Patient/family informed of bed offers received.  Patient chooses bed at [facility]     Physician recommends and patient chooses bed at      Patient to be transferred to [facility] on 10/14/17.  Patient to be transferred to facility by [facility]     Patient family notified on 10/14/17 of transfer.  Name of family member notified:  Denyse Amass (brother)      PHYSICIAN       Additional Comment: Pt stable for dc to [facility] per MD. Chrys Racer at [facility] updated. DC clinical sent through the hub. Updated pt's brother Denyse Amass by phone. Updated RN who will call report and arrange transport. No other CSW needs for dc.   _______________________________________________ Shade Flood, LCSW 10/14/2017, 1:20 PM

## 2017-10-14 NOTE — Progress Notes (Signed)
EMS picked up patient to take to [facility] [place]. Patient left with personal belongings and IV removed with site intact.

## 2017-10-14 NOTE — Progress Notes (Signed)
Called report to [facility] nurse.

## 2017-10-14 NOTE — Progress Notes (Addendum)
[name]  MRN: [id number]  DOB/AGE: 08-18-1960 57 y.o.  Primary Care Physician:Patient, No Pcp Per  Admit date: 10/04/2017  Chief Complaint:  Chief Complaint  Patient presents with  . Fall    Padilla-Pt presented on  10/04/2017 with  Chief Complaint  Patient presents with  . Fall  .    Pt offers no new complaints   Meds . aspirin EC  81 mg Oral Daily  . benztropine  1 mg Oral Daily  . buPROPion  300 mg Oral Daily  . Chlorhexidine Gluconate Cloth  6 each Topical Q0600  . divalproex  500 mg Oral QHS  . fluPHENAZine  5 mg Oral Daily  . insulin aspart  0-9 Units Subcutaneous TID WC  . insulin glargine  5 Units Subcutaneous QHS  . latanoprost  1 drop Both Eyes QHS  . levothyroxine  25 mcg Oral QAC breakfast  . multivitamin with minerals  1 tablet Oral Daily  . polyethylene glycol  17 g Oral Daily  . risperiDONE  3 mg Oral QHS  . simvastatin  40 mg Oral Daily  . sodium bicarbonate  50 mEq Intravenous Once      Physical Exam: Vital signs in last 24 hours: Temp:  [97.7 F (36.5 C)-98.5 F (36.9 C)] 98.5 F (36.9 C) (06/25 2036) Pulse Rate:  [59-71] 59 (06/25 2036) Resp:  [18-20] 20 (06/25 2036) BP: (136-164)/(68-91) 164/68 (06/25 2036) SpO2:  [100 %] 100 % (06/25 2036) Weight change:  Last BM Date: 10/05/17  Intake/Output from previous day: 06/25 0701 - 06/26 0700 In: 360 [P.O.:360] Out: 800 [Urine:800] Total I/O In: 120 [P.O.:120] Out: -    Physical Exam: General- pt is awake,follows commands Resp- No acute REsp distress, CTA B/L NO Rhonchi CVS- S1S2 regular in rate and rhythm GIT- BS+, soft, NT, ND EXT- NO LE Edema, Cyanosis Access- Tunneled  Cath left chest   Lab Results: CBC No results for input(Padilla): WBC, HGB, HCT, PLT in the last 72 hours.  BMET Recent Labs    10/12/17 0535  NA 135  K 3.9  CL 95*  CO2 28  GLUCOSE 99  BUN 29*  CREATININE 6.95*  CALCIUM 9.2    MICRO Recent Results (from the past 240 hour(Padilla))  MRSA PCR Screening      Status: None   Collection Time: 10/05/17 12:44 AM  Result Value Ref Range Status   MRSA by PCR NEGATIVE NEGATIVE Final    Comment:        The GeneXpert MRSA Assay (FDA approved for NASAL specimens only), is one component of a comprehensive MRSA colonization surveillance program. It is not intended to diagnose MRSA infection nor to guide or monitor treatment for MRSA infections. 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  5)CNS-admitted with AMS Primary MD following  6)Electrolytes Hyperkalemic   Now  better  NOrmonatremic   7)Acid base Co2 at goal  8) Access-Pt currently had temporary cath                   AVF clotted                    tunneled cath to be placed 6.20.19  Plan:   Will dialyze today  Addendum Pt seen on HD Pt tolerated tx. Pt had some emesis after the hd . Will follow    [name] 10/14/2017, 6:29 AM

## 2017-10-14 NOTE — Progress Notes (Signed)
Physical Therapy Treatment Patient Details Name: [name] MRN: [id number] DOB: [birth year] Today's Date: 10/14/2017    History of Present Illness [name] is a 57 y.o. female with medical history significant of bipolar 1 disorder, schizophrenia, hypertension, type 2 diabetes who is brought to the emergency department from the [facility] family care home due to altered mental status.  She was seen recently, on Friday, at [facility] for AV fistula thrombosis.  She was reported to be acting normally Saturday and early Sunday morning.  She had a fall earlier today.  No further history is available at this time.     PT Comments    Pt sitting on EOB and willing to participate with therapy today.  Pt demonstrates vast improvements with improved activity tolerance and able to ambulate 110 ft x 2 with short seated rest break inbetween.  Pt slightly unsteady with gait with tendency to drift Rt and Lt, no LOB during session.  EOS pt left in chair with chair alarm set and call bell within reach.  RN aware of pt. status.    Follow Up Recommendations  Supervision/Assistance - 24 hour     Equipment Recommendations  Rolling walker with 5" wheels    Recommendations for Other Services       Precautions / Restrictions Precautions Precautions: Fall Restrictions Weight Bearing Restrictions: No    Mobility  Bed Mobility               General bed mobility comments: Sitting on EOB at entrance  Transfers Overall transfer level: Modified independent Equipment used: Rolling walker (2 wheeled) Transfers: Sit to/from Stand Sit to Stand: Supervision         General transfer comment: with or without use of RW  Ambulation/Gait Ambulation/Gait assistance: Min guard Gait Distance (Feet): 220 Feet(161ft x 2 with short seated rest break ) Assistive device: Rolling walker (2 wheeled) Gait Pattern/deviations: Decreased step length - right;Decreased step length - left;Decreased stride  length Gait velocity: decreased   General Gait Details: slightly unsteady drifting Rt to Lt, no LOB   Stairs             Wheelchair Mobility    Modified Rankin (Stroke Patients Only)       Balance                                            Cognition Arousal/Alertness: Awake/alert Behavior During Therapy: WFL for tasks assessed/performed Overall Cognitive Status: Within Functional Limits for tasks assessed                                        Exercises      General Comments        Pertinent Vitals/Pain Pain Assessment: No/denies pain    Home Living                      Prior Function            PT Goals (current goals can now be found in the care plan section)      Frequency    Min 3X/week      PT Plan      Co-evaluation              AM-PAC  PT "6 Clicks" Daily Activity  Outcome Measure  Difficulty turning over in bed (including adjusting bedclothes, sheets and blankets)?: None Difficulty moving from lying on back to sitting on the side of the bed? : None Difficulty sitting down on and standing up from a chair with arms (e.g., wheelchair, bedside commode, etc,.)?: None Help needed moving to and from a bed to chair (including a wheelchair)?: None Help needed walking in hospital room?: A Little Help needed climbing 3-5 steps with a railing? : A Little 6 Click Score: 22    End of Session Equipment Utilized During Treatment: Gait belt Activity Tolerance: Patient tolerated treatment well;Patient limited by fatigue Patient left: in chair;with call bell/phone within reach;with chair alarm set Nurse Communication: Mobility status PT Visit Diagnosis: Unsteadiness on feet (R26.81);Other abnormalities of gait and mobility (R26.89);Muscle weakness (generalized) (M62.81)     Time: 3887-1959 PT Time Calculation (min) (ACUTE ONLY): 17 min  Charges:  $Therapeutic Activity: 8-22 mins                     G Codes:       Ihor Austin, LPTA; CBIS [phone]   Aldona Lento 10/14/2017, 11:02 AM

## 2018-09-15 ENCOUNTER — Other Ambulatory Visit: Payer: Self-pay

## 2018-09-15 ENCOUNTER — Inpatient Hospital Stay (HOSPITAL_COMMUNITY): Payer: Medicaid Other

## 2018-09-15 ENCOUNTER — Encounter (HOSPITAL_COMMUNITY): Payer: Self-pay | Admitting: Emergency Medicine

## 2018-09-15 ENCOUNTER — Inpatient Hospital Stay (HOSPITAL_COMMUNITY)
Admission: EM | Admit: 2018-09-15 | Discharge: 2018-09-21 | DRG: 640 | Disposition: A | Discharge status: Hospice | Payer: Medicaid Other | Source: Skilled Nursing Facility | Attending: Internal Medicine | Admitting: Internal Medicine

## 2018-09-15 DIAGNOSIS — Z794 Long term (current) use of insulin: Secondary | ICD-10-CM

## 2018-09-15 DIAGNOSIS — F319 Bipolar disorder, unspecified: Secondary | ICD-10-CM | POA: Diagnosis not present

## 2018-09-15 DIAGNOSIS — E8770 Fluid overload, unspecified: Secondary | ICD-10-CM | POA: Diagnosis present

## 2018-09-15 DIAGNOSIS — Z79899 Other long term (current) drug therapy: Secondary | ICD-10-CM

## 2018-09-15 DIAGNOSIS — L03314 Cellulitis of groin: Secondary | ICD-10-CM | POA: Diagnosis not present

## 2018-09-15 DIAGNOSIS — Z87891 Personal history of nicotine dependence: Secondary | ICD-10-CM

## 2018-09-15 DIAGNOSIS — G9340 Encephalopathy, unspecified: Secondary | ICD-10-CM

## 2018-09-15 DIAGNOSIS — R4182 Altered mental status, unspecified: Secondary | ICD-10-CM | POA: Diagnosis not present

## 2018-09-15 DIAGNOSIS — Z1159 Encounter for screening for other viral diseases: Secondary | ICD-10-CM

## 2018-09-15 DIAGNOSIS — E875 Hyperkalemia: Principal | ICD-10-CM

## 2018-09-15 DIAGNOSIS — E872 Acidosis: Secondary | ICD-10-CM | POA: Diagnosis present

## 2018-09-15 DIAGNOSIS — N186 End stage renal disease: Secondary | ICD-10-CM | POA: Diagnosis present

## 2018-09-15 DIAGNOSIS — G9341 Metabolic encephalopathy: Secondary | ICD-10-CM | POA: Diagnosis not present

## 2018-09-15 DIAGNOSIS — Z9115 Patient's noncompliance with renal dialysis: Secondary | ICD-10-CM | POA: Diagnosis not present

## 2018-09-15 DIAGNOSIS — D631 Anemia in chronic kidney disease: Secondary | ICD-10-CM | POA: Diagnosis present

## 2018-09-15 DIAGNOSIS — Z833 Family history of diabetes mellitus: Secondary | ICD-10-CM

## 2018-09-15 DIAGNOSIS — M898X9 Other specified disorders of bone, unspecified site: Secondary | ICD-10-CM | POA: Diagnosis present

## 2018-09-15 DIAGNOSIS — E1122 Type 2 diabetes mellitus with diabetic chronic kidney disease: Secondary | ICD-10-CM

## 2018-09-15 DIAGNOSIS — Z515 Encounter for palliative care: Secondary | ICD-10-CM | POA: Diagnosis not present

## 2018-09-15 DIAGNOSIS — R45851 Suicidal ideations: Secondary | ICD-10-CM | POA: Diagnosis present

## 2018-09-15 DIAGNOSIS — F259 Schizoaffective disorder, unspecified: Secondary | ICD-10-CM | POA: Diagnosis present

## 2018-09-15 DIAGNOSIS — Z7189 Other specified counseling: Secondary | ICD-10-CM | POA: Diagnosis not present

## 2018-09-15 DIAGNOSIS — Z9119 Patient's noncompliance with other medical treatment and regimen: Secondary | ICD-10-CM | POA: Diagnosis not present

## 2018-09-15 DIAGNOSIS — F209 Schizophrenia, unspecified: Secondary | ICD-10-CM | POA: Diagnosis present

## 2018-09-15 DIAGNOSIS — R531 Weakness: Secondary | ICD-10-CM

## 2018-09-15 DIAGNOSIS — N179 Acute kidney failure, unspecified: Secondary | ICD-10-CM

## 2018-09-15 DIAGNOSIS — Z992 Dependence on renal dialysis: Secondary | ICD-10-CM | POA: Diagnosis not present

## 2018-09-15 DIAGNOSIS — E119 Type 2 diabetes mellitus without complications: Secondary | ICD-10-CM

## 2018-09-15 DIAGNOSIS — E871 Hypo-osmolality and hyponatremia: Secondary | ICD-10-CM

## 2018-09-15 DIAGNOSIS — E785 Hyperlipidemia, unspecified: Secondary | ICD-10-CM | POA: Diagnosis not present

## 2018-09-15 DIAGNOSIS — E039 Hypothyroidism, unspecified: Secondary | ICD-10-CM | POA: Diagnosis not present

## 2018-09-15 DIAGNOSIS — Z66 Do not resuscitate: Secondary | ICD-10-CM | POA: Diagnosis present

## 2018-09-15 DIAGNOSIS — Z7982 Long term (current) use of aspirin: Secondary | ICD-10-CM | POA: Diagnosis not present

## 2018-09-15 DIAGNOSIS — I120 Hypertensive chronic kidney disease with stage 5 chronic kidney disease or end stage renal disease: Secondary | ICD-10-CM | POA: Diagnosis present

## 2018-09-15 DIAGNOSIS — Z79890 Hormone replacement therapy (postmenopausal): Secondary | ICD-10-CM

## 2018-09-15 LAB — CBC WITH DIFFERENTIAL/PLATELET
Abs Immature Granulocytes: 0.02 10*3/uL (ref 0.00–0.07)
Basophils Absolute: 0 10*3/uL (ref 0.0–0.1)
Basophils Relative: 0 %
Eosinophils Absolute: 0.1 10*3/uL (ref 0.0–0.5)
Eosinophils Relative: 1 %
HCT: 29.5 % — ABNORMAL LOW (ref 36.0–46.0)
Hemoglobin: 9.5 g/dL — ABNORMAL LOW (ref 12.0–15.0)
Immature Granulocytes: 0 %
Lymphocytes Relative: 11 %
Lymphs Abs: 0.9 10*3/uL (ref 0.7–4.0)
MCH: 28.9 pg (ref 26.0–34.0)
MCHC: 32.2 g/dL (ref 30.0–36.0)
MCV: 89.7 fL (ref 80.0–100.0)
Monocytes Absolute: 0.7 10*3/uL (ref 0.1–1.0)
Monocytes Relative: 8 %
Neutro Abs: 6.1 10*3/uL (ref 1.7–7.7)
Neutrophils Relative %: 80 %
Platelets: 269 10*3/uL (ref 150–400)
RBC: 3.29 MIL/uL — ABNORMAL LOW (ref 3.87–5.11)
RDW: 15.6 % — ABNORMAL HIGH (ref 11.5–15.5)
WBC: 7.7 10*3/uL (ref 4.0–10.5)
nRBC: 0 % (ref 0.0–0.2)

## 2018-09-15 LAB — BASIC METABOLIC PANEL
Anion gap: 14 (ref 5–15)
Anion gap: 16 — ABNORMAL HIGH (ref 5–15)
BUN: 116 mg/dL — ABNORMAL HIGH (ref 6–20)
BUN: 116 mg/dL — ABNORMAL HIGH (ref 6–20)
CO2: 16 mmol/L — ABNORMAL LOW (ref 22–32)
CO2: 16 mmol/L — ABNORMAL LOW (ref 22–32)
Calcium: 9.5 mg/dL (ref 8.9–10.3)
Calcium: 8.8 mg/dL — ABNORMAL LOW (ref 8.9–10.3)
Chloride: 94 mmol/L — ABNORMAL LOW (ref 98–111)
Chloride: 95 mmol/L — ABNORMAL LOW (ref 98–111)
Creatinine, Ser: 12.6 mg/dL — ABNORMAL HIGH (ref 0.44–1.00)
Creatinine, Ser: 12.63 mg/dL — ABNORMAL HIGH (ref 0.44–1.00)
GFR calc Af Amer: 3 mL/min — ABNORMAL LOW (ref >60)
GFR calc Af Amer: 3 mL/min — ABNORMAL LOW (ref >60)
GFR calc non Af Amer: 3 mL/min — ABNORMAL LOW (ref >60)
GFR calc non Af Amer: 3 mL/min — ABNORMAL LOW (ref >60)
Glucose, Bld: 190 mg/dL — ABNORMAL HIGH (ref 70–99)
Glucose, Bld: 46 mg/dL — ABNORMAL LOW (ref 70–99)
Potassium: 7.2 mmol/L (ref 3.5–5.1)
Potassium: 6.4 mmol/L (ref 3.5–5.1)
Sodium: 124 mmol/L — ABNORMAL LOW (ref 135–145)
Sodium: 127 mmol/L — ABNORMAL LOW (ref 135–145)

## 2018-09-15 LAB — CBG MONITORING, ED: Glucose-Capillary: 180 mg/dL — ABNORMAL HIGH (ref 70–99)

## 2018-09-15 LAB — SARS CORONAVIRUS 2 BY RT PCR (HOSPITAL ORDER, PERFORMED IN ~~LOC~~ HOSPITAL LAB): SARS Coronavirus 2: NEGATIVE

## 2018-09-15 LAB — MAGNESIUM: Magnesium: 2.6 mg/dL — ABNORMAL HIGH (ref 1.7–2.4)

## 2018-09-15 LAB — PHOSPHORUS: Phosphorus: 7.1 mg/dL — ABNORMAL HIGH (ref 2.5–4.6)

## 2018-09-15 MED ORDER — DEXTROSE 50 % IV SOLN
1.0000 | Freq: Once | INTRAVENOUS | Status: AC
  Administered 2018-09-15: 50 mL via INTRAVENOUS

## 2018-09-15 MED ORDER — SODIUM BICARBONATE 8.4 % IV SOLN
50.0000 meq | Freq: Once | INTRAVENOUS | Status: AC
  Administered 2018-09-15: 50 meq via INTRAVENOUS
  Filled 2018-09-15: qty 50

## 2018-09-15 MED ORDER — ACETAMINOPHEN 650 MG RE SUPP: 650.0000 mg | Freq: Four times a day (QID) | RECTAL | Status: DC | PRN

## 2018-09-15 MED ORDER — SODIUM BICARBONATE 8.4 % IV SOLN
50.0000 meq | Freq: Once | INTRAVENOUS | Status: AC
  Administered 2018-09-15: 16:00:00 50 meq via INTRAVENOUS
  Filled 2018-09-15: qty 50

## 2018-09-15 MED ORDER — LEVOTHYROXINE SODIUM 25 MCG PO TABS
25.0000 ug | ORAL_TABLET | Freq: Every day | ORAL | Status: DC
  Administered 2018-09-16 – 2018-09-21 (×6): 25 ug via ORAL
  Filled 2018-09-15 (×6): qty 1

## 2018-09-15 MED ORDER — SODIUM CHLORIDE 0.9% FLUSH
3.0000 mL | Freq: Two times a day (BID) | INTRAVENOUS | Status: DC
  Administered 2018-09-16 – 2018-09-17 (×4): 3 mL via INTRAVENOUS

## 2018-09-15 MED ORDER — RISPERIDONE 1 MG PO TABS
3.0000 mg | ORAL_TABLET | Freq: Every day | ORAL | Status: DC
  Administered 2018-09-16 – 2018-09-21 (×6): 3 mg via ORAL
  Filled 2018-09-15 (×8): qty 3

## 2018-09-15 MED ORDER — HEPARIN SODIUM (PORCINE) 5000 UNIT/ML IJ SOLN
5000.0000 [IU] | Freq: Three times a day (TID) | INTRAMUSCULAR | Status: DC
  Administered 2018-09-16 – 2018-09-20 (×9): 5000 [IU] via SUBCUTANEOUS
  Filled 2018-09-15 (×11): qty 1

## 2018-09-15 MED ORDER — CHLORHEXIDINE GLUCONATE CLOTH 2 % EX PADS
6.0000 | MEDICATED_PAD | Freq: Every day | CUTANEOUS | Status: DC
  Administered 2018-09-17 – 2018-09-20 (×3): 6 via TOPICAL

## 2018-09-15 MED ORDER — DEXTROSE 50 % IV SOLN
INTRAVENOUS | Status: AC
  Filled 2018-09-15: qty 50

## 2018-09-15 MED ORDER — RENA-VITE PO TABS
1.0000 | ORAL_TABLET | Freq: Every day | ORAL | Status: DC
  Administered 2018-09-16 – 2018-09-20 (×5): 1 via ORAL
  Filled 2018-09-15 (×5): qty 1

## 2018-09-15 MED ORDER — BUPROPION HCL ER (XL) 150 MG PO TB24
300.0000 mg | ORAL_TABLET | Freq: Every day | ORAL | Status: DC
  Administered 2018-09-16 – 2018-09-21 (×6): 300 mg via ORAL
  Filled 2018-09-15 (×6): qty 2

## 2018-09-15 MED ORDER — BENZTROPINE MESYLATE 1 MG PO TABS
1.0000 mg | ORAL_TABLET | Freq: Every day | ORAL | Status: DC
  Administered 2018-09-16 – 2018-09-21 (×6): 1 mg via ORAL
  Filled 2018-09-15 (×6): qty 1

## 2018-09-15 MED ORDER — LATANOPROST 0.005 % OP SOLN
1.0000 [drp] | Freq: Every day | OPHTHALMIC | Status: DC
  Administered 2018-09-17 – 2018-09-20 (×4): 1 [drp] via OPHTHALMIC
  Filled 2018-09-15: qty 2.5

## 2018-09-15 MED ORDER — ACETAMINOPHEN 325 MG PO TABS
650.0000 mg | ORAL_TABLET | Freq: Four times a day (QID) | ORAL | Status: DC | PRN
  Administered 2018-09-17 – 2018-09-21 (×5): 650 mg via ORAL
  Filled 2018-09-15 (×5): qty 2

## 2018-09-15 MED ORDER — SIMVASTATIN 20 MG PO TABS
40.0000 mg | ORAL_TABLET | Freq: Every day | ORAL | Status: DC
  Administered 2018-09-16 – 2018-09-21 (×6): 40 mg via ORAL
  Filled 2018-09-15 (×6): qty 2

## 2018-09-15 MED ORDER — SODIUM CHLORIDE 0.9% FLUSH: 3.0000 mL | INTRAVENOUS | Status: DC | PRN

## 2018-09-15 MED ORDER — SODIUM ZIRCONIUM CYCLOSILICATE 5 G PO PACK
10.0000 g | PACK | Freq: Once | ORAL | Status: AC
  Administered 2018-09-15: 10 g via ORAL
  Filled 2018-09-15: qty 2

## 2018-09-15 MED ORDER — CALCIUM GLUCONATE 10 % IV SOLN
1.0000 g | Freq: Once | INTRAVENOUS | Status: AC
  Administered 2018-09-15: 1 g via INTRAVENOUS
  Filled 2018-09-15: qty 10

## 2018-09-15 MED ORDER — SEVELAMER CARBONATE 800 MG PO TABS
2400.0000 mg | ORAL_TABLET | Freq: Three times a day (TID) | ORAL | Status: DC
  Administered 2018-09-16 – 2018-09-21 (×13): 2400 mg via ORAL
  Filled 2018-09-15 (×16): qty 3

## 2018-09-15 MED ORDER — FLUPHENAZINE HCL 2.5 MG PO TABS
5.0000 mg | ORAL_TABLET | Freq: Every day | ORAL | Status: DC
  Administered 2018-09-18 – 2018-09-21 (×4): 5 mg via ORAL
  Filled 2018-09-15: qty 2
  Filled 2018-09-15 (×3): qty 1
  Filled 2018-09-15 (×3): qty 2

## 2018-09-15 MED ORDER — SODIUM POLYSTYRENE SULFONATE 15 GM/60ML PO SUSP
15.0000 g | Freq: Once | ORAL | Status: AC
  Administered 2018-09-15: 20:00:00 15 g via ORAL
  Filled 2018-09-15: qty 60

## 2018-09-15 MED ORDER — DIVALPROEX SODIUM 500 MG PO DR TAB
500.0000 mg | DELAYED_RELEASE_TABLET | Freq: Every day | ORAL | Status: DC
  Administered 2018-09-16 – 2018-09-21 (×6): 500 mg via ORAL
  Filled 2018-09-15 (×6): qty 1

## 2018-09-15 MED ORDER — SEVELAMER CARBONATE 800 MG PO TABS: 800.0000 mg | ORAL_TABLET | Freq: Three times a day (TID) | ORAL | Status: DC

## 2018-09-15 MED ORDER — SODIUM CHLORIDE 0.9 % IV SOLN: 250.0000 mL | INTRAVENOUS | Status: DC | PRN

## 2018-09-15 MED ORDER — ASPIRIN EC 81 MG PO TBEC
81.0000 mg | DELAYED_RELEASE_TABLET | Freq: Every day | ORAL | Status: DC
  Administered 2018-09-16 – 2018-09-21 (×6): 81 mg via ORAL
  Filled 2018-09-15 (×6): qty 1

## 2018-09-15 MED ORDER — INSULIN ASPART 100 UNIT/ML ~~LOC~~ SOLN
10.0000 [IU] | Freq: Once | SUBCUTANEOUS | Status: AC
  Administered 2018-09-15: 10 [IU] via INTRAVENOUS
  Filled 2018-09-15: qty 1

## 2018-09-15 MED ORDER — DOCUSATE SODIUM 100 MG PO CAPS
100.0000 mg | ORAL_CAPSULE | Freq: Every day | ORAL | Status: DC
  Administered 2018-09-16 – 2018-09-21 (×6): 100 mg via ORAL
  Filled 2018-09-15 (×6): qty 1

## 2018-09-15 NOTE — ED Notes (Signed)
Phlebotomy at bedside.

## 2018-09-15 NOTE — ED Notes (Signed)
ED Provider at bedside. 

## 2018-09-15 NOTE — Consult Note (Signed)
Reason for Consult:Hyperkalemia, confusion Referring Physician: [name] is an 58 y.o. female.  HPI: 58 yr female presented to [facility] ED with weakness, ^ confusion, from [facility] in [place].  Missed HD for over 1 wk.  Has ^K, acidemia, tremor confusion.  She is confused but says N, V.  No trauma, HA or SOB.   On HD MWF in [place], does not know her Kidney Dr or her primary. Knows lives in [facility] and is Wed but not where she is at. Review of systems not obtained due to patient factors.   Dialyzes at [facility] [place]  on MWF since ?7/18. Primary Nephrologist ?. EDW :?. HD [place] ?, Dialyzer ?, Heparin ?[facility] Access  R fem PC.  Past Medical History:  Diagnosis Date  . Bipolar 1 disorder ([place])   . Diabetes mellitus without complication ([place])   . Hypertension   . Schizophrenia [facility])     Past Surgical History:  Procedure Laterality Date  . IR FLUORO GUIDE CV LINE LEFT  10/08/2017  . IR US GUIDE VASC ACCESS LEFT  10/08/2017    Family History  Problem Relation Age of Onset  . Diabetes Mother     Social History:  reports that she has quit smoking. Her smoking use included cigarettes. She has never used smokeless tobacco. She reports that she does not drink alcohol or use drugs.  Allergies: No Known Allergies  Medications:  I have reviewed the patient's current medications. Prior to Admission:  Medications Prior to Admission  Medication Sig Dispense Refill Last Dose  . acetaminophen (TYLENOL) 500 MG tablet Take 500 mg by mouth every 6 (six) hours as needed.   unknown  . aspirin EC 81 MG tablet Take 81 mg by mouth daily.   10/04/2017 at Unknown time  . benztropine (COGENTIN) 1 MG tablet Take 1 mg by mouth.   10/03/2017 at Unknown time  . buPROPion (WELLBUTRIN XL) 300 MG 24 hr tablet Take 300 mg by mouth daily.   10/04/2017 at Unknown time  . divalproex (DEPAKOTE) 500 MG DR tablet Take 500 mg by mouth at bedtime.    10/03/2017 at Unknown time  . docusate sodium (COLACE)  100 MG capsule Take 100 mg by mouth.   10/04/2017 at Unknown time  . fluPHENAZine (PROLIXIN) 5 MG tablet Take 5 mg by mouth.   10/03/2017 at Unknown time  . insulin glargine (LANTUS) 100 UNIT/ML injection Inject 0.05 mLs (5 Units total) into the skin at bedtime. 10 mL 0 10/03/2017 at Unknown time  . insulin regular (NOVOLIN R,HUMULIN R) 100 units/mL injection Inject into the skin 3 (three) times daily before meals. >200-249 = 2 250-299=4 300-349=6 350-399=8 400-449=10 [phone]=12 >500 ems   unknown  . latanoprost (XALATAN) 0.005 % ophthalmic solution Place 1 drop into both eyes at bedtime.    10/03/2017 at Unknown time  . levothyroxine (SYNTHROID, LEVOTHROID) 25 MCG tablet Take 1 tablet (25 mcg total) by mouth daily before breakfast.     . Multiple Vitamin (MULTIVITAMIN WITH MINERALS) TABS tablet Take 1 tablet by mouth daily.   10/04/2017 at Unknown time  . risperiDONE (RISPERDAL) 3 MG tablet Take 1 tablet (3 mg total) by mouth at bedtime.     . sevelamer carbonate (RENVELA) 800 MG tablet Take 800 mg by mouth 3 (three) times daily with meals.    10/04/2017 at Unknown time  . simvastatin (ZOCOR) 40 MG tablet Take 40 mg by mouth daily.   10/03/2017 at Unknown time  Results for orders placed or performed during the hospital encounter of 09/15/18 (from the past 48 hour(s))  POC CBG, ED     Status: Abnormal   Collection Time: 09/15/18  3:39 PM  Result Value Ref Range   Glucose-Capillary 180 (H) 70 - 99 mg/dL  SARS Coronavirus 2 (CEPHEID - Performed in [place] hospital lab), Hosp Order     Status: None   Collection Time: 09/15/18  3:49 PM  Result Value Ref Range   SARS Coronavirus 2 NEGATIVE NEGATIVE    Comment: (NOTE) If result is NEGATIVE SARS-CoV-2 target nucleic acids are NOT DETECTED. The SARS-CoV-2 RNA is generally detectable in upper and lower  respiratory specimens during the acute phase of infection. The lowest  concentration of SARS-CoV-2 viral copies this assay can detect is 250  copies  / mL. A negative result does not preclude SARS-CoV-2 infection  and should not be used as the sole basis for treatment or other  patient management decisions.  A negative result may occur with  improper specimen collection / handling, submission of specimen other  than nasopharyngeal swab, presence of viral mutation(s) within the  areas targeted by this assay, and inadequate number of viral copies  (<250 copies / mL). A negative result must be combined with clinical  observations, patient history, and epidemiological information. If result is POSITIVE SARS-CoV-2 target nucleic acids are DETECTED. The SARS-CoV-2 RNA is generally detectable in upper and lower  respiratory specimens dur ing the acute phase of infection.  Positive  results are indicative of active infection with SARS-CoV-2.  Clinical  correlation with patient history and other diagnostic information is  necessary to determine patient infection status.  Positive results do  not rule out bacterial infection or co-infection with other viruses. If result is PRESUMPTIVE POSTIVE SARS-CoV-2 nucleic acids MAY BE PRESENT.   A presumptive positive result was obtained on the submitted specimen  and confirmed on repeat testing.  While 2019 novel coronavirus  (SARS-CoV-2) nucleic acids may be present in the submitted sample  additional confirmatory testing may be necessary for epidemiological  and / or clinical management purposes  to differentiate between  SARS-CoV-2 and other Sarbecovirus currently known to infect humans.  If clinically indicated additional testing with an alternate test  methodology [phone]) is advised. The SARS-CoV-2 RNA is generally  detectable in upper and lower respiratory sp ecimens during the acute  phase of infection. The expected result is Negative. Fact Sheet for Patients:  StrictlyIdeas.no Fact Sheet for Healthcare Providers: BankingDealers.co.za This test  is not yet approved or cleared by the [place] FDA and has been authorized for detection and/or diagnosis of SARS-CoV-2 by FDA under an Emergency Use Authorization (EUA).  This EUA will remain in effect (meaning this test can be used) for the duration of the COVID-19 declaration under Section 564(b)(1) of the Act, 21 U.S.C. section 360bbb-3(b)(1), unless the authorization is terminated or revoked sooner. Performed at [facility], [address] [place], [place] [address]   Basic metabolic panel     Status: Abnormal   Collection Time: 09/15/18  4:07 PM  Result Value Ref Range   Sodium 124 (L) 135 - 145 mmol/L   Potassium 7.2 (HH) 3.5 - 5.1 mmol/L    Comment: CRITICAL RESULT CALLED TO, READ BACK BY AND VERIFIED WITH: MINTER,R ON 09/15/18 AT 1715 BY LOY,C    Chloride 94 (L) 98 - 111 mmol/L   CO2 16 (L) 22 - 32 mmol/L   Glucose, Bld 190 (H)  70 - 99 mg/dL   BUN 116 (H) 6 - 20 mg/dL    Comment: RESULTS CONFIRMED BY MANUAL DILUTION   Creatinine, Ser 12.60 (H) 0.44 - 1.00 mg/dL   Calcium 9.5 8.9 - 10.3 mg/dL   GFR calc non Af Amer 3 (L) >60 mL/min   GFR calc Af Amer 3 (L) >60 mL/min   Anion gap 14 5 - 15    Comment: Performed at [facility], [address] [place], [place] [address]  Magnesium     Status: Abnormal   Collection Time: 09/15/18  4:07 PM  Result Value Ref Range   Magnesium 2.6 (H) 1.7 - 2.4 mg/dL    Comment: Performed at [facility], [address], [place], [place] [address]  Phosphorus     Status: Abnormal   Collection Time: 09/15/18  4:07 PM  Result Value Ref Range   Phosphorus 7.1 (H) 2.5 - 4.6 mg/dL    Comment: Performed at [facility], [address] [place], [place] [address]  CBC with Differential     Status: Abnormal   Collection Time: 09/15/18  4:07 PM  Result Value Ref Range   WBC 7.7 4.0 - 10.5 K/uL   RBC 3.29 (L) 3.87 - 5.11 MIL/uL   Hemoglobin 9.5 (L) 12.0 - 15.0 g/dL   HCT 29.5 (L) 36.0 - 46.0 %   MCV 89.7 80.0 - 100.0 fL   MCH 28.9 26.0 - 34.0  pg   MCHC 32.2 30.0 - 36.0 g/dL   RDW 15.6 (H) 11.5 - 15.5 %   Platelets 269 150 - 400 K/uL   nRBC 0.0 0.0 - 0.2 %   Neutrophils Relative % 80 %   Neutro Abs 6.1 1.7 - 7.7 K/uL   Lymphocytes Relative 11 %   Lymphs Abs 0.9 0.7 - 4.0 K/uL   Monocytes Relative 8 %   Monocytes Absolute 0.7 0.1 - 1.0 K/uL   Eosinophils Relative 1 %   Eosinophils Absolute 0.1 0.0 - 0.5 K/uL   Basophils Relative 0 %   Basophils Absolute 0.0 0.0 - 0.1 K/uL   Immature Granulocytes 0 %   Abs Immature Granulocytes 0.02 0.00 - 0.07 K/uL    Comment: Performed at [facility], [address] [place], [place] [address]  Basic metabolic panel     Status: Abnormal   Collection Time: 09/15/18  6:44 PM  Result Value Ref Range   Sodium 127 (L) 135 - 145 mmol/L   Potassium 6.4 (HH) 3.5 - 5.1 mmol/L    Comment: CRITICAL RESULT CALLED TO, READ BACK BY AND VERIFIED WITH: DOSS,M ON 09/15/18 AT 1950 BY LOY,C    Chloride 95 (L) 98 - 111 mmol/L   CO2 16 (L) 22 - 32 mmol/L   Glucose, Bld 46 (L) 70 - 99 mg/dL   BUN 116 (H) 6 - 20 mg/dL    Comment: RESULTS CONFIRMED BY MANUAL DILUTION   Creatinine, Ser 12.63 (H) 0.44 - 1.00 mg/dL   Calcium 8.8 (L) 8.9 - 10.3 mg/dL   GFR calc non Af Amer 3 (L) >60 mL/min   GFR calc Af Amer 3 (L) >60 mL/min   Anion gap 16 (H) 5 - 15    Comment: Performed at [facility], [address], [place], [place] [address]    No results found.  ROS Blood pressure (!) 174/71, pulse 63, temperature 97.7 F (36.5 C), temperature source Oral, resp. rate 18, height 5\' 6"  (1.676 m), weight 68 kg, SpO2 100 %. Physical Exam  Physical Examination: General appearance - tremulous, not coherent Mental status - Ox2 but not conversant and ? reliable Eyes - DM retinal dz Mouth - pale, teeth ok Neck - supple, , adenopathy noted PCL Lymphatics - posterior cervical nodes Chest - rales noted bibasilar, decreased bs Heart - S1 and S2 normal, systolic murmur PP9/5 at 2nd left intercostal space Abdomen - pos bs,  liver down 4 cm , nontender Extremities - pedal edema 2 +, DP 1+, R fem PC, crusting, no dressing Skin - pale, moles, scars,   Assessment/Plan: 1 Hyperkalemia , vol xs, HTn , enceph needs HD will do for full tx. 2 ESRD: as above 3 Hypertension: lower vol 4. Anemia of ESRD: will get records in am 5. Metabolic Bone Disease: will add binders 6 Enceph ? Meds, underlying Schizo/bipolar and uremia 7Nonadherence. P HD, get records, lower K , vol , solute  [name] 09/15/2018, 9:35 PM

## 2018-09-15 NOTE — ED Triage Notes (Addendum)
Per EMS, pt from [facility] sent over for refusing dialysis x 1 week. Facility reported to EMS that pt appeared more altered than usual. Pt is oriented, but lethargic at this time. Pt states she does not want to go to dialysis because it makes her feel bad.

## 2018-09-15 NOTE — H&P (Signed)
TRH H&P    Patient Demographics:    [name], is a 58 y.o. female  MRN: [id number]  DOB - Nov 23, 1960  Admit Date - 09/15/2018  Referring MD/NP/PA:  Virgel Manifold  Outpatient Primary MD for the patient is Patient, No Pcp Per  Patient coming from:  home  Chief complaint-   I haven't had dialysis   HPI:    [name]  is a 58 y.o. 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 Pt doesn't state why she didn't take dialysis to me. Pt seems to be a poor historian.  axox2 (person, year), pt denies cp, palp, sob, n/v, abd pain, diarrhea, brbpr, black stool.   In ED,  T 97.7  P 45-79  Bp 180/79  Pox 96%-99% on RA Wt 68 kg  Covid negative Na 124, K 7.2   Hco3 16 Bun 116, Creatinine 12.60  (prior 6.95) Glucose 116 Magnesium 2.6 Phosphorus 7.1 (prev 5.5) Wbc 7.7, Hgb 9.5  Plt 269  EKG nsr at 45, nl axis, slightly peaked T waves, slight st elevation in v1,2 (old, present on ekg 10/06/2017) Repeat EKG nsr at 75, nl axis, slight st elevation in v1,2,  (old, present on ekg 10/06/2017)  Pt received calcium gluconate iv, sodium bicarb iv x2, and lokelma 10gm x1, and kayexalate 15gm po x1 for hyperkalemia  Potassium trending down 7.2=> 6.4  Pt will be admitted for Severe hyperkalemia in the setting of ESRD on HD (not had for the past >1 week),  Pt seems to be slightly altered, may be due to uremia, but will obtain CT brain noncontrast as hypertension and dm2 and esrd put her at higher risk of CVA.               Review of systems:    In addition to the HPI above,  No Fever-chills, No Headache, No changes with Vision or hearing, No problems swallowing food or Liquids, No Chest pain, Cough or Shortness of Breath, No Abdominal pain, No Nausea or Vomiting, bowel movements are regular, No  Blood in stool or Urine, No dysuria, No new skin rashes or bruises, No new joints pains-aches,  No new weakness, tingling, numbness in any extremity, No recent weight gain or loss, No polyuria, polydypsia or polyphagia, No significant Mental Stressors.  All other systems reviewed and are negative.    Past History of the following :    Past Medical History:  Diagnosis Date  . Bipolar 1 disorder ([place])   . Diabetes mellitus without complication ([place])   . Hypertension   . Schizophrenia [facility])       Past Surgical History:  Procedure Laterality Date  . IR FLUORO GUIDE CV LINE LEFT  10/08/2017  . IR US GUIDE VASC ACCESS LEFT  10/08/2017      Social History:      Social History   Tobacco Use  . Smoking status: Former Smoker  Types: Cigarettes  . Smokeless tobacco: Never Used  Substance Use Topics  . Alcohol use: No       Family History :     Family History  Problem Relation Age of Onset  . Diabetes Mother       Home Medications:   Prior to Admission medications   Medication Sig Start Date End Date Taking? Authorizing Provider  acetaminophen (TYLENOL) 500 MG tablet Take 500 mg by mouth every 6 (six) hours as needed.    [provider]  aspirin EC 81 MG tablet Take 81 mg by mouth daily.    [provider]  benztropine (COGENTIN) 1 MG tablet Take 1 mg by mouth.    [provider]  buPROPion (WELLBUTRIN XL) 300 MG 24 hr tablet Take 300 mg by mouth daily.    [provider]  divalproex (DEPAKOTE) 500 MG DR tablet Take 500 mg by mouth at bedtime.     [provider]  docusate sodium (COLACE) 100 MG capsule Take 100 mg by mouth.    [provider]  fluPHENAZine (PROLIXIN) 5 MG tablet Take 5 mg by mouth.    [provider]  insulin glargine (LANTUS) 100 UNIT/ML injection Inject 0.05 mLs (5 Units total) into the skin at bedtime. 10/24/16   Orson Eva, MD  insulin regular (NOVOLIN R,HUMULIN R) 100 units/mL  injection Inject into the skin 3 (three) times daily before meals. >200-249 = 2 250-299=4 300-349=6 350-399=8 400-449=10 [phone]=12 >500 ems    [provider]  latanoprost (XALATAN) 0.005 % ophthalmic solution Place 1 drop into both eyes at bedtime.     [provider]  levothyroxine (SYNTHROID, LEVOTHROID) 25 MCG tablet Take 1 tablet (25 mcg total) by mouth daily before breakfast. 10/15/17   Barton Dubois, MD  Multiple Vitamin (MULTIVITAMIN WITH MINERALS) TABS tablet Take 1 tablet by mouth daily.    [provider]  risperiDONE (RISPERDAL) 3 MG tablet Take 1 tablet (3 mg total) by mouth at bedtime. 10/14/17   Barton Dubois, MD  sevelamer carbonate (RENVELA) 800 MG tablet Take 800 mg by mouth 3 (three) times daily with meals.     [provider]  simvastatin (ZOCOR) 40 MG tablet Take 40 mg by mouth daily.    [provider]     Allergies:    No Known Allergies   Physical Exam:   Vitals  Blood pressure 138/65, pulse 65, temperature 97.7 F (36.5 C), temperature source Oral, resp. rate 16, height 5\' 6"  (1.676 m), weight 68 kg, SpO2 99 %.  1.  General: axoxo2 (person, year)  2. Psychiatric: Euthymic, slightly nervous  3. Neurologic: cn2-12 intact, reflexes 2+symmetric, diffuse with no clonus,  Motor 5/5 in all 4 ext, no slurred speech  4. HEENMT:  Anicteric, pupils 1.35mm symmetric, direct, consensual, near intact Mmm Neck: no jvd,   5. Respiratory : CTAB  6. Cardiovascular : rrr s1, s2, 2/6 sem rusb/llsb  7. Gastrointestinal:  Abd: soft, nt, nd, +bs  8. Skin:  Ext: no c/c/e,  No rash, + AVF right upper ext, + bruit  9.Musculoskeletal:  Good ROM  No adenopathy    Data Review:    CBC Recent Labs  Lab 09/15/18 1607  WBC 7.7  HGB 9.5*  HCT 29.5*  PLT 269  MCV 89.7  MCH 28.9  MCHC 32.2  RDW 15.6*  LYMPHSABS 0.9  MONOABS 0.7  EOSABS 0.1  BASOSABS 0.0    ------------------------------------------------------------------------------------------------------------------  Results for orders placed or  performed during the hospital encounter of 09/15/18 (from the past 48 hour(s))  POC CBG, ED     Status: Abnormal   Collection Time: 09/15/18  3:39 PM  Result Value Ref Range   Glucose-Capillary 180 (H) 70 - 99 mg/dL  SARS Coronavirus 2 (CEPHEID - Performed in [place] hospital lab), Hosp Order     Status: None   Collection Time: 09/15/18  3:49 PM  Result Value Ref Range   SARS Coronavirus 2 NEGATIVE NEGATIVE    Comment: (NOTE) If result is NEGATIVE SARS-CoV-2 target nucleic acids are NOT DETECTED. The SARS-CoV-2 RNA is generally detectable in upper and lower  respiratory specimens during the acute phase of infection. The lowest  concentration of SARS-CoV-2 viral copies this assay can detect is 250  copies / mL. A negative result does not preclude SARS-CoV-2 infection  and should not be used as the sole basis for treatment or other  patient management decisions.  A negative result may occur with  improper specimen collection / handling, submission of specimen other  than nasopharyngeal swab, presence of viral mutation(s) within the  areas targeted by this assay, and inadequate number of viral copies  (<250 copies / mL). A negative result must be combined with clinical  observations, patient history, and epidemiological information. If result is POSITIVE SARS-CoV-2 target nucleic acids are DETECTED. The SARS-CoV-2 RNA is generally detectable in upper and lower  respiratory specimens dur ing the acute phase of infection.  Positive  results are indicative of active infection with SARS-CoV-2.  Clinical  correlation with patient history and other diagnostic information is  necessary to determine patient infection status.  Positive results do  not rule out bacterial infection or co-infection with other viruses. If result is PRESUMPTIVE POSTIVE  SARS-CoV-2 nucleic acids MAY BE PRESENT.   A presumptive positive result was obtained on the submitted specimen  and confirmed on repeat testing.  While 2019 novel coronavirus  (SARS-CoV-2) nucleic acids may be present in the submitted sample  additional confirmatory testing may be necessary for epidemiological  and / or clinical management purposes  to differentiate between  SARS-CoV-2 and other Sarbecovirus currently known to infect humans.  If clinically indicated additional testing with an alternate test  methodology [phone]) is advised. The SARS-CoV-2 RNA is generally  detectable in upper and lower respiratory sp ecimens during the acute  phase of infection. The expected result is Negative. Fact Sheet for Patients:  StrictlyIdeas.no Fact Sheet for Healthcare Providers: BankingDealers.co.za This test is not yet approved or cleared by the [place] FDA and has been authorized for detection and/or diagnosis of SARS-CoV-2 by FDA under an Emergency Use Authorization (EUA).  This EUA will remain in effect (meaning this test can be used) for the duration of the COVID-19 declaration under Section 564(b)(1) of the Act, 21 U.S.C. section 360bbb-3(b)(1), unless the authorization is terminated or revoked sooner. Performed at [facility], [address] [place], [place] [address]   Basic metabolic panel     Status: Abnormal   Collection Time: 09/15/18  4:07 PM  Result Value Ref Range   Sodium 124 (L) 135 - 145 mmol/L   Potassium 7.2 (HH) 3.5 - 5.1 mmol/L    Comment: CRITICAL RESULT CALLED TO, READ BACK BY AND VERIFIED WITH: MINTER,R ON 09/15/18 AT 1715 BY LOY,C    Chloride 94 (L) 98 - 111 mmol/L   CO2 16 (L) 22 - 32 mmol/L   Glucose, Bld 190 (H) 70 - 99 mg/dL  BUN 116 (H) 6 - 20 mg/dL    Comment: RESULTS CONFIRMED BY MANUAL DILUTION   Creatinine, Ser 12.60 (H) 0.44 - 1.00 mg/dL   Calcium 9.5 8.9 - 10.3 mg/dL   GFR calc non Af Amer 3  (L) >60 mL/min   GFR calc Af Amer 3 (L) >60 mL/min   Anion gap 14 5 - 15    Comment: Performed at [facility], [address] [place], [place] [address]  Magnesium     Status: Abnormal   Collection Time: 09/15/18  4:07 PM  Result Value Ref Range   Magnesium 2.6 (H) 1.7 - 2.4 mg/dL    Comment: Performed at [facility], [address] [place], [place] [address]  Phosphorus     Status: Abnormal   Collection Time: 09/15/18  4:07 PM  Result Value Ref Range   Phosphorus 7.1 (H) 2.5 - 4.6 mg/dL    Comment: Performed at [facility], [address] [place], [place] [address]  CBC with Differential     Status: Abnormal   Collection Time: 09/15/18  4:07 PM  Result Value Ref Range   WBC 7.7 4.0 - 10.5 K/uL   RBC 3.29 (L) 3.87 - 5.11 MIL/uL   Hemoglobin 9.5 (L) 12.0 - 15.0 g/dL   HCT 29.5 (L) 36.0 - 46.0 %   MCV 89.7 80.0 - 100.0 fL   MCH 28.9 26.0 - 34.0 pg   MCHC 32.2 30.0 - 36.0 g/dL   RDW 15.6 (H) 11.5 - 15.5 %   Platelets 269 150 - 400 K/uL   nRBC 0.0 0.0 - 0.2 %   Neutrophils Relative % 80 %   Neutro Abs 6.1 1.7 - 7.7 K/uL   Lymphocytes Relative 11 %   Lymphs Abs 0.9 0.7 - 4.0 K/uL   Monocytes Relative 8 %   Monocytes Absolute 0.7 0.1 - 1.0 K/uL   Eosinophils Relative 1 %   Eosinophils Absolute 0.1 0.0 - 0.5 K/uL   Basophils Relative 0 %   Basophils Absolute 0.0 0.0 - 0.1 K/uL   Immature Granulocytes 0 %   Abs Immature Granulocytes 0.02 0.00 - 0.07 K/uL    Comment: Performed at [facility], [address], [place], [place] [address]  Basic metabolic panel     Status: Abnormal   Collection Time: 09/15/18  6:44 PM  Result Value Ref Range   Sodium 127 (L) 135 - 145 mmol/L   Potassium 6.4 (HH) 3.5 - 5.1 mmol/L    Comment: CRITICAL RESULT CALLED TO, READ BACK BY AND VERIFIED WITH: DOSS,M ON 09/15/18 AT 1950 BY LOY,C    Chloride 95 (L) 98 - 111 mmol/L   CO2 16 (L) 22 - 32 mmol/L   Glucose, Bld 46 (L) 70 - 99 mg/dL   BUN 116 (H) 6 - 20 mg/dL    Comment: RESULTS CONFIRMED BY  MANUAL DILUTION   Creatinine, Ser 12.63 (H) 0.44 - 1.00 mg/dL   Calcium 8.8 (L) 8.9 - 10.3 mg/dL   GFR calc non Af Amer 3 (L) >60 mL/min   GFR calc Af Amer 3 (L) >60 mL/min   Anion gap 16 (H) 5 - 15    Comment: Performed at [facility], [address] [place],  [address]    Chemistries  Recent Labs  Lab 09/15/18 1607 09/15/18 1844  NA 124* 127*  K 7.2* 6.4*  CL 94* 95*  CO2 16* 16*  GLUCOSE 190* 46*  BUN 116* 116*  CREATININE 12.60* 12.63*  CALCIUM  9.5 8.8*  MG 2.6*  --    ------------------------------------------------------------------------------------------------------------------  ------------------------------------------------------------------------------------------------------------------ GFR: Estimated Creatinine Clearance: 4.6 mL/min (A) (by C-G formula based on SCr of 12.63 mg/dL (H)). Liver Function Tests: No results for input(s): AST, ALT, ALKPHOS, BILITOT, PROT, ALBUMIN in the last 168 hours. No results for input(s): LIPASE, AMYLASE in the last 168 hours. No results for input(s): AMMONIA in the last 168 hours. Coagulation Profile: No results for input(s): INR, PROTIME in the last 168 hours. Cardiac Enzymes: No results for input(s): CKTOTAL, CKMB, CKMBINDEX, TROPONINI in the last 168 hours. BNP (last 3 results) No results for input(s): PROBNP in the last 8760 hours. HbA1C: No results for input(s): HGBA1C in the last 72 hours. CBG: Recent Labs  Lab 09/15/18 1539  GLUCAP 180*   Lipid Profile: No results for input(s): CHOL, HDL, LDLCALC, TRIG, CHOLHDL, LDLDIRECT in the last 72 hours. Thyroid Function Tests: No results for input(s): TSH, T4TOTAL, FREET4, T3FREE, THYROIDAB in the last 72 hours. Anemia Panel: No results for input(s): VITAMINB12, FOLATE, FERRITIN, TIBC, IRON, RETICCTPCT in the last 72 hours.  --------------------------------------------------------------------------------------------------------------- Urine analysis:     Component Value Date/Time   COLORURINE Yellow 09/17/2012 1205   APPEARANCEUR Clear 09/17/2012 1205   LABSPEC 1.013 09/17/2012 1205   PHURINE 9.0 09/17/2012 1205   GLUCOSEU Negative 09/17/2012 1205   HGBUR Negative 09/17/2012 1205   BILIRUBINUR Negative 09/17/2012 1205   KETONESUR Trace 09/17/2012 1205   PROTEINUR 30 mg/dL 09/17/2012 1205   NITRITE Negative 09/17/2012 1205   LEUKOCYTESUR Negative 09/17/2012 1205      Imaging Results:    No results found.     Assessment & Plan:    Principal Problem:   Hyperkalemia Active Problems:   Altered mental status   ESRD (end stage renal disease) (HCC)   Diabetes mellitus ([place])   ARF (acute renal failure) (HCC)   Hyponatremia  Hyperkalemia, ARF secondary to not having HD >1 week Tele K 7.2=> 6.4 Sodium bicarb 55meq iv x1 Kayexalate 15gm po x1 Nephrology consulted for dialysis by ED.   AMS secondary to uremia Check depakote level Check TSH Check CT brain noncontrast  ESRD on HD (? M, W, F) Hyperphosphatemia Cont Renvela 800mg  po tid Nephrology consulted as above  Hyponatremia No iv fluids for now since has not had dialysis >1 week  Dm2 Hold Lantus for now Hold Regular insulin for now fsbs ac and qhs, ISS  Hyperlipidemia Cont simvastatin 40mg  po qhs  Schizophrenia/ Bipolar Cont Risperdal 3mg  po qhs Cont Prolixin 5mg  po  qday Cont Cogentin 1mg  po qday  Cont Depakote 500mg  po qhs Psychiatry consult ordered in computer to help with management of medications  Hypothyroidism Cont Levothyroxine 25 micrograms po qday  Constipation Cont Colace 100mg  po qday   DVT Prophylaxis-   Heparin- SCDs   AM Labs Ordered, also please review Full Orders  Family Communication: Admission, patients condition and plan of care including tests being ordered have been discussed with the patient  who indicate understanding and agree with the plan and Code Status.  Code Status:  FULL CODE  Admission status: Inpatient: Based on  patients clinical presentation and evaluation of above clinical data, I have made determination that patient meets Inpatient criteria at this time.  Pt being admitted for severe hyperkalemia 7.2 in setting of ESRD, w peaked T waves,  this is a life threatening condition for which she has received iv treatment x2 .  Pt also has ARF , Creatinine 12.6 so will require HD, and  close monitoring of potassium and renal function.  Her AMS likely related to uremia also requires further w/up, I believe that in order to correct her hyperkalemia, ARF, hyponatremia, severe acidosis Hco3=16,  and to correct her AMS this will require > 2 nites stay and inpatient admission.   Time spent in minutes : 70   Jani Gravel M.D on 09/15/2018 at 8:22 PM

## 2018-09-15 NOTE — ED Notes (Signed)
CRITICAL VALUE ALERT  Critical Value:  Potassium 7.2  Date & Time Notied:  09/15/2018, 1718  Provider Notified: Dr. Wilson Singer  Orders Received/Actions taken: no new orders

## 2018-09-15 NOTE — ED Notes (Signed)
CRITICAL VALUE ALERT  Critical Value:  Potassium 6.4  Date & Time Notied:  09/15/2018 1950  Provider Notified: Dr. Maudie Mercury  Orders Received/Actions taken: See chart

## 2018-09-15 NOTE — ED Notes (Signed)
ED TO INPATIENT HANDOFF REPORT  ED Nurse Name and Phone #: Rosalie Gums, RN [phone]  S Name/Age/Gender [name] 58 y.o. female Room/Bed: APA18/APA18  Code Status   Code Status: Full Code  Home/SNF/Other Nursing Home Patient oriented to: self, place and situation Is this baseline? Yes   Triage Complete: Triage complete  Chief Complaint Altered Mental Status  Triage Note Per EMS, pt from [facility] sent over for refusing dialysis x 1 week. Facility reported to EMS that pt appeared more altered than usual. Pt is oriented, but lethargic at this time. Pt states she does not want to go to dialysis because it makes her feel bad.    Allergies No Known Allergies  Level of Care/Admitting Diagnosis ED Disposition    ED Disposition Condition Comment   Admit  Hospital Area: [facility] [[id number]]  Level of Care: Telemetry Medical [104]  Covid Evaluation: N/A  Diagnosis: ARF (acute renal failure) [facility]) [[id number]]  Admitting Physician: Jani Gravel [phone]  Attending Physician: Jani Gravel [phone]  Estimated length of stay: past midnight tomorrow  Certification:: I certify this patient will need inpatient services for at least 2 midnights  PT Class (Do Not Modify): Inpatient [101]  PT Acc Code (Do Not Modify): Private [1]       B Medical/Surgery History Past Medical History:  Diagnosis Date  . Bipolar 1 disorder ([place])   . Diabetes mellitus without complication ([place])   . Hypertension   . Schizophrenia [facility])    Past Surgical History:  Procedure Laterality Date  . IR FLUORO GUIDE CV LINE LEFT  10/08/2017  . IR US GUIDE VASC ACCESS LEFT  10/08/2017     A IV Location/Drains/Wounds Patient Lines/Drains/Airways Status   Active Line/Drains/Airways    Name:   Placement date:   Placement time:   Site:   Days:   Peripheral IV 09/15/18 Left Wrist   09/15/18    1543    Wrist   less than 1   Fistula / Graft Right   -    -    -      Hemodialysis Catheter Left Internal  jugular Permanent   10/08/17    1240    Internal jugular   342          Intake/Output Last 24 hours No intake or output data in the 24 hours ending 09/15/18 2035  Labs/Imaging Results for orders placed or performed during the hospital encounter of 09/15/18 (from the past 48 hour(s))  POC CBG, ED     Status: Abnormal   Collection Time: 09/15/18  3:39 PM  Result Value Ref Range   Glucose-Capillary 180 (H) 70 - 99 mg/dL  SARS Coronavirus 2 (CEPHEID - Performed in [place] hospital lab), Hosp Order     Status: None   Collection Time: 09/15/18  3:49 PM  Result Value Ref Range   SARS Coronavirus 2 NEGATIVE NEGATIVE    Comment: (NOTE) If result is NEGATIVE SARS-CoV-2 target nucleic acids are NOT DETECTED. The SARS-CoV-2 RNA is generally detectable in upper and lower  respiratory specimens during the acute phase of infection. The lowest  concentration of SARS-CoV-2 viral copies this assay can detect is 250  copies / mL. A negative result does not preclude SARS-CoV-2 infection  and should not be used as the sole basis for treatment or other  patient management decisions.  A negative result may occur with  improper specimen collection / handling, submission of specimen other  than nasopharyngeal  swab, presence of viral mutation(s) within the  areas targeted by this assay, and inadequate number of viral copies  (<250 copies / mL). A negative result must be combined with clinical  observations, patient history, and epidemiological information. If result is POSITIVE SARS-CoV-2 target nucleic acids are DETECTED. The SARS-CoV-2 RNA is generally detectable in upper and lower  respiratory specimens dur ing the acute phase of infection.  Positive  results are indicative of active infection with SARS-CoV-2.  Clinical  correlation with patient history and other diagnostic information is  necessary to determine patient infection status.  Positive results do  not rule out bacterial infection or  co-infection with other viruses. If result is PRESUMPTIVE POSTIVE SARS-CoV-2 nucleic acids MAY BE PRESENT.   A presumptive positive result was obtained on the submitted specimen  and confirmed on repeat testing.  While 2019 novel coronavirus  (SARS-CoV-2) nucleic acids may be present in the submitted sample  additional confirmatory testing may be necessary for epidemiological  and / or clinical management purposes  to differentiate between  SARS-CoV-2 and other Sarbecovirus currently known to infect humans.  If clinically indicated additional testing with an alternate test  methodology [phone]) is advised. The SARS-CoV-2 RNA is generally  detectable in upper and lower respiratory sp ecimens during the acute  phase of infection. The expected result is Negative. Fact Sheet for Patients:  StrictlyIdeas.no Fact Sheet for Healthcare Providers: BankingDealers.co.za This test is not yet approved or cleared by the [place] FDA and has been authorized for detection and/or diagnosis of SARS-CoV-2 by FDA under an Emergency Use Authorization (EUA).  This EUA will remain in effect (meaning this test can be used) for the duration of the COVID-19 declaration under Section 564(b)(1) of the Act, 21 U.S.C. section 360bbb-3(b)(1), unless the authorization is terminated or revoked sooner. Performed at [facility], [address], [place], [place] [address]   Basic metabolic panel     Status: Abnormal   Collection Time: 09/15/18  4:07 PM  Result Value Ref Range   Sodium 124 (L) 135 - 145 mmol/L   Potassium 7.2 (HH) 3.5 - 5.1 mmol/L    Comment: CRITICAL RESULT CALLED TO, READ BACK BY AND VERIFIED WITH: MINTER,R ON 09/15/18 AT 1715 BY LOY,C    Chloride 94 (L) 98 - 111 mmol/L   CO2 16 (L) 22 - 32 mmol/L   Glucose, Bld 190 (H) 70 - 99 mg/dL   BUN 116 (H) 6 - 20 mg/dL    Comment: RESULTS CONFIRMED BY MANUAL DILUTION   Creatinine, Ser 12.60 (H) 0.44 -  1.00 mg/dL   Calcium 9.5 8.9 - 10.3 mg/dL   GFR calc non Af Amer 3 (L) >60 mL/min   GFR calc Af Amer 3 (L) >60 mL/min   Anion gap 14 5 - 15    Comment: Performed at [facility], [address], [place], [place] [address]  Magnesium     Status: Abnormal   Collection Time: 09/15/18  4:07 PM  Result Value Ref Range   Magnesium 2.6 (H) 1.7 - 2.4 mg/dL    Comment: Performed at [facility], [address] [place], [place] [address]  Phosphorus     Status: Abnormal   Collection Time: 09/15/18  4:07 PM  Result Value Ref Range   Phosphorus 7.1 (H) 2.5 - 4.6 mg/dL    Comment: Performed at [facility], [address], [place], [place] [address]  CBC with Differential     Status: Abnormal   Collection Time:  09/15/18  4:07 PM  Result Value Ref Range   WBC 7.7 4.0 - 10.5 K/uL   RBC 3.29 (L) 3.87 - 5.11 MIL/uL   Hemoglobin 9.5 (L) 12.0 - 15.0 g/dL   HCT 29.5 (L) 36.0 - 46.0 %   MCV 89.7 80.0 - 100.0 fL   MCH 28.9 26.0 - 34.0 pg   MCHC 32.2 30.0 - 36.0 g/dL   RDW 15.6 (H) 11.5 - 15.5 %   Platelets 269 150 - 400 K/uL   nRBC 0.0 0.0 - 0.2 %   Neutrophils Relative % 80 %   Neutro Abs 6.1 1.7 - 7.7 K/uL   Lymphocytes Relative 11 %   Lymphs Abs 0.9 0.7 - 4.0 K/uL   Monocytes Relative 8 %   Monocytes Absolute 0.7 0.1 - 1.0 K/uL   Eosinophils Relative 1 %   Eosinophils Absolute 0.1 0.0 - 0.5 K/uL   Basophils Relative 0 %   Basophils Absolute 0.0 0.0 - 0.1 K/uL   Immature Granulocytes 0 %   Abs Immature Granulocytes 0.02 0.00 - 0.07 K/uL    Comment: Performed at [facility], [address] [place], [place] [address]  Basic metabolic panel     Status: Abnormal   Collection Time: 09/15/18  6:44 PM  Result Value Ref Range   Sodium 127 (L) 135 - 145 mmol/L   Potassium 6.4 (HH) 3.5 - 5.1 mmol/L    Comment: CRITICAL RESULT CALLED TO, READ BACK BY AND VERIFIED WITH: DOSS,M ON 09/15/18 AT 1950 BY LOY,C    Chloride 95 (L) 98 - 111 mmol/L   CO2 16 (L) 22 - 32 mmol/L   Glucose, Bld 46 (L) 70 - 99  mg/dL   BUN 116 (H) 6 - 20 mg/dL    Comment: RESULTS CONFIRMED BY MANUAL DILUTION   Creatinine, Ser 12.63 (H) 0.44 - 1.00 mg/dL   Calcium 8.8 (L) 8.9 - 10.3 mg/dL   GFR calc non Af Amer 3 (L) >60 mL/min   GFR calc Af Amer 3 (L) >60 mL/min   Anion gap 16 (H) 5 - 15    Comment: Performed at [facility], [address] [place], [place] [address]   No results found.  Pending Labs Unresulted Labs (From admission, onward)    Start     Ordered   09/16/18 0500  CBC  Tomorrow morning,   R     09/15/18 1753   09/16/18 0500  Comprehensive metabolic panel  Tomorrow morning,   R     09/15/18 1753   09/15/18 1751  HIV antibody (Routine Testing)  Once,   R     09/15/18 1753          Vitals/Pain Today's Vitals   09/15/18 1900 09/15/18 1930 09/15/18 1941 09/15/18 2000  BP: (!) 142/58 140/67  138/65  Pulse: 71 66  65  Resp: (!) 21 18  16   Temp:      TempSrc:      SpO2: 96% 96%  99%  Weight:      Height:      PainSc:   0-No pain     Isolation Precautions No active isolations  Medications Medications  heparin injection 5,000 Units (has no administration in time range)  sodium chloride flush (NS) 0.9 % injection 3 mL (has no administration in time range)  sodium chloride flush (NS) 0.9 % injection 3 mL (has no administration in time range)  0.9 %  sodium chloride infusion (has no administration in time range)  acetaminophen (TYLENOL) tablet 650 mg (has no administration in time range)    Or  acetaminophen (TYLENOL) suppository 650 mg (has no administration in time range)  sodium bicarbonate injection 50 mEq (50 mEq Intravenous Given 09/15/18 1600)  calcium gluconate inj 10% (1 g) URGENT USE ONLY! (1 g Intravenous Given 09/15/18 1557)  insulin aspart (novoLOG) injection 10 Units (10 Units Intravenous Given 09/15/18 1600)  dextrose 50 % solution 50 mL (50 mLs Intravenous Given 09/15/18 1559)  sodium zirconium cyclosilicate (LOKELMA) packet 10 g (10 g Oral Given 09/15/18 1726)  sodium  polystyrene (KAYEXALATE) 15 GM/60ML suspension 15 g (15 g Oral Given 09/15/18 2023)  sodium bicarbonate injection 50 mEq (50 mEq Intravenous Given 09/15/18 2023)    Mobility walks Moderate fall risk   Focused Assessments    R Recommendations: See Admitting Provider Note  Report given to: Lexine Baton, RN  Additional Notes:

## 2018-09-15 NOTE — ED Provider Notes (Signed)
[facility] EMERGENCY DEPARTMENT Provider Note   CSN: [id number] Arrival date & time: 09/15/18  1526    History   Chief Complaint Chief Complaint  Patient presents with   Altered Mental Status    HPI ROSARIO DUEY is a 58 y.o. female.  HPI   38yF with confusion. She has a history of mental illness and apparently has been refusing HD recently. Last had HD some time last week but exact date not clear to me. Increasingly lethargic and confused over the past day. No reported fever, ingestion, trauma, etc.   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[place])    Schizophrenia ([place]) 10/04/2017   Hyperkalemia 10/04/2017   Anemia in ESRD (end-stage renal disease) ([place]) 10/04/2017   Dysphasia    Altered mental status 10/22/2016   ESRD (end stage renal disease) ([place]) 10/22/2016   Bipolar 1 disorder ([place]) 10/22/2016   Diabetes mellitus ([place]) 10/22/2016   Essential hypertension 10/22/2016   Acute encephalopathy 10/22/2016    Past Surgical History:  Procedure Laterality Date   IR FLUORO GUIDE CV LINE LEFT  10/08/2017   IR US GUIDE VASC ACCESS LEFT  10/08/2017     OB History   No obstetric history on file.      Home Medications    Prior to Admission medications   Medication Sig Start Date End Date Taking? Authorizing Provider  acetaminophen (TYLENOL) 500 MG tablet Take 500 mg by mouth every 6 (six) hours as needed.    [provider]  aspirin EC 81 MG tablet Take 81 mg by mouth daily.    [provider]  benztropine (COGENTIN) 1 MG tablet Take 1 mg by mouth.    [provider]  buPROPion (WELLBUTRIN XL) 300 MG 24 hr tablet Take 300 mg by mouth daily.    [provider]  divalproex (DEPAKOTE) 500 MG DR tablet  Take 500 mg by mouth at bedtime.     [provider]  docusate sodium (COLACE) 100 MG capsule Take 100 mg by mouth.    [provider]  fluPHENAZine (PROLIXIN) 5 MG tablet Take 5 mg by mouth.    [provider]  insulin glargine (LANTUS) 100 UNIT/ML injection Inject 0.05 mLs (5 Units total) into the skin at bedtime. 10/24/16   Orson Eva, MD  insulin regular (NOVOLIN R,HUMULIN R) 100 units/mL injection Inject into the skin 3 (three) times daily before meals. >200-249 = 2 250-299=4 300-349=6 350-399=8 400-449=10 [phone] >500 ems    [provider]  latanoprost (XALATAN) 0.005 % ophthalmic solution Place 1 drop into both eyes at bedtime.     [provider]  levothyroxine (SYNTHROID, LEVOTHROID) 25 MCG tablet Take 1 tablet (25 mcg total) by mouth daily before breakfast. 10/15/17   Barton Dubois, MD  Multiple Vitamin (MULTIVITAMIN WITH MINERALS) TABS tablet Take 1 tablet by mouth daily.    [provider]  risperiDONE (RISPERDAL) 3 MG tablet Take 1 tablet (3 mg total) by mouth at bedtime. 10/14/17   Barton Dubois, MD  sevelamer carbonate (RENVELA) 800 MG tablet Take 800 mg by mouth 3 (three) times daily with meals.     [provider]  simvastatin (ZOCOR) 40 MG tablet Take 40 mg by mouth daily.  [provider]    Family History No family history on file.  Social History Social History   Tobacco Use   Smoking status: Former Smoker    Types: Cigarettes   Smokeless tobacco: Never Used  Substance Use Topics   Alcohol use: No   Drug use: No     Allergies   Patient has no known allergies.   Review of Systems Review of Systems  Level 5 caveat because of confusion.  Physical Exam Updated Vital Signs BP (!) 178/79    Pulse 74    Temp 97.7 F (36.5 C) (Oral)    Resp 16    Ht 5\' 6"  (1.676 m)    Wt 68 kg    SpO2 100%    BMI 24.21 kg/m   Physical Exam Vitals signs and nursing note reviewed.  Constitutional:       Appearance: She is well-developed.     Comments: Chronically ill appearing  HENT:     Head: Normocephalic and atraumatic.  Eyes:     General:        Right eye: No discharge.        Left eye: No discharge.     Conjunctiva/sclera: Conjunctivae normal.  Neck:     Musculoskeletal: Neck supple.  Cardiovascular:     Rate and Rhythm: Regular rhythm.     Heart sounds: Normal heart sounds. No murmur. No friction rub. No gallop.      Comments: Bradycardic. AV fistula RUE.  Pulmonary:     Effort: Pulmonary effort is normal. No respiratory distress.     Breath sounds: Normal breath sounds.  Abdominal:     General: There is no distension.     Palpations: Abdomen is soft.     Tenderness: There is no abdominal tenderness.  Musculoskeletal:        General: No tenderness.  Skin:    General: Skin is warm and dry.  Neurological:     Mental Status: She is alert.     Comments: Oriented to self and seems to understand she is in the hospital but at [facility]. Speech is slow. Follows simple commands.       ED Treatments / Results  Labs (all labs ordered are listed, but only abnormal results are displayed) Labs Reviewed  BASIC METABOLIC PANEL - Abnormal; Notable for the following components:      Result Value   Sodium 124 (*)    Potassium 7.2 (*)    Chloride 94 (*)    CO2 16 (*)    Glucose, Bld 190 (*)    BUN 116 (*)    Creatinine, Ser 12.60 (*)    GFR calc non Af Amer 3 (*)    GFR calc Af Amer 3 (*)    All other components within normal limits  MAGNESIUM - Abnormal; Notable for the following components:   Magnesium 2.6 (*)    All other components within normal limits  PHOSPHORUS - Abnormal; Notable for the following components:   Phosphorus 7.1 (*)    All other components within normal limits  CBC WITH DIFFERENTIAL/PLATELET - Abnormal; Notable for the following components:   RBC 3.29 (*)    Hemoglobin 9.5 (*)    HCT 29.5 (*)    RDW 15.6 (*)    All other components within  normal limits  CBG MONITORING, ED - Abnormal; Notable for the following components:   Glucose-Capillary 180 (*)    All other components within normal limits  SARS CORONAVIRUS 2 (  HOSPITAL ORDER, [facility])  HIV ANTIBODY (ROUTINE TESTING W REFLEX)  CREATININE, SERUM  BASIC METABOLIC PANEL  CBC  COMPREHENSIVE METABOLIC PANEL    EKG EKG Interpretation  Date/Time:  Wednesday Sep 15 2018 15:33:28 EDT Ventricular Rate:  44 PR Interval:    QRS Duration: 116 QT Interval:  436 QTC Calculation: 373 R Axis:   -43 Text Interpretation:  Sinus bradycardia Short PR interval Nonspecific IVCD with LAD Left ventricular hypertrophy Suggestive of hyperkalemia Confirmed by Virgel Manifold [phone]) on 09/15/2018 3:41:46 PM   Radiology No results found.  Procedures Procedures (including critical care time)  CRITICAL CARE Performed by: [place] Manifold Total critical care time: 35 minutes Critical care time was exclusive of separately billable procedures and treating other patients. Critical care was necessary to treat or prevent imminent or life-threatening deterioration. Critical care was time spent personally by me on the following activities: development of treatment plan with patient and/or surrogate as well as nursing, discussions with consultants, evaluation of patient's response to treatment, examination of patient, obtaining history from patient or surrogate, ordering and performing treatments and interventions, ordering and review of laboratory studies, ordering and review of radiographic studies, pulse oximetry and re-evaluation of patient's condition.   Medications Ordered in ED Medications  sodium bicarbonate injection 50 mEq (50 mEq Intravenous Given 09/15/18 1600)  calcium gluconate inj 10% (1 g) URGENT USE ONLY! (1 g Intravenous Given 09/15/18 1557)  insulin aspart (novoLOG) injection 10 Units (10 Units Intravenous Given 09/15/18 1600)  dextrose 50 % solution 50  mL (50 mLs Intravenous Given 09/15/18 1559)  sodium zirconium cyclosilicate (LOKELMA) packet 10 g (10 g Oral Given 09/15/18 1726)     Initial Impression / Assessment and Plan / ED Course  I have reviewed the triage vital signs and the nursing notes.  Pertinent labs & imaging results that were available during my care of the patient were reviewed by me and considered in my medical decision making (see chart for details).    57yF with encephalopathy. Likely from uremia. Apparently has been refusing HD recently and reportedly last had dialysis sometime last week. Bradycardic on arrival with HR in 40s. Sinus brady with very short PR interval versus junctional rhythm. Clearly sinus rhythm with narrowing of QRS after she was given Ca, bicarb and insulin. Potassium subsequently resulted at 7.2. Realistically going to be several hours before can arrange for HD. Dose of lokelma given.  Discussed with Dr Jimmy Footman, nephrology. Will have to transfer to [facility] tonight for HD. Will discuss with medical service for admission.   KAYLN GARCEAU was evaluated in Emergency Department on 09/15/2018 for the symptoms described in the history of present illness. She was evaluated in the context of the global COVID-19 pandemic, which necessitated consideration that the patient might be at risk for infection with the SARS-CoV-2 virus that causes COVID-19. Institutional protocols and algorithms that pertain to the evaluation of patients at risk for COVID-19 are in a state of rapid change based on information released by regulatory bodies including the CDC and federal and state organizations. These policies and algorithms were followed during the patient's care in the ED.   Final Clinical Impressions(s) / ED Diagnoses   Final diagnoses:  Acute hyperkalemia  ESRD (end stage renal disease) on dialysis [facility])  Encephalopathy    ED Discharge Orders    None       Virgel Manifold, MD 09/15/18 1754

## 2018-09-15 NOTE — ED Notes (Signed)
Hospitalist at bedside 

## 2018-09-16 ENCOUNTER — Inpatient Hospital Stay (HOSPITAL_COMMUNITY): Payer: Medicaid Other

## 2018-09-16 LAB — GLUCOSE, CAPILLARY
Glucose-Capillary: 129 mg/dL — ABNORMAL HIGH (ref 70–99)
Glucose-Capillary: 142 mg/dL — ABNORMAL HIGH (ref 70–99)

## 2018-09-16 LAB — RENAL FUNCTION PANEL
Albumin: 2.5 g/dL — ABNORMAL LOW (ref 3.5–5.0)
Anion gap: 13 (ref 5–15)
BUN: 61 mg/dL — ABNORMAL HIGH (ref 6–20)
CO2: 23 mmol/L (ref 22–32)
Calcium: 8.4 mg/dL — ABNORMAL LOW (ref 8.9–10.3)
Chloride: 92 mmol/L — ABNORMAL LOW (ref 98–111)
Creatinine, Ser: 8.12 mg/dL — ABNORMAL HIGH (ref 0.44–1.00)
GFR calc Af Amer: 6 mL/min — ABNORMAL LOW (ref >60)
GFR calc non Af Amer: 5 mL/min — ABNORMAL LOW (ref >60)
Glucose, Bld: 126 mg/dL — ABNORMAL HIGH (ref 70–99)
Phosphorus: 6.4 mg/dL — ABNORMAL HIGH (ref 2.5–4.6)
Potassium: 4.6 mmol/L (ref 3.5–5.1)
Sodium: 128 mmol/L — ABNORMAL LOW (ref 135–145)

## 2018-09-16 LAB — MRSA PCR SCREENING: MRSA by PCR: NEGATIVE

## 2018-09-16 LAB — PHOSPHORUS: Phosphorus: 4.6 mg/dL (ref 2.5–4.6)

## 2018-09-16 LAB — HEPATITIS B SURFACE ANTIGEN: Hepatitis B Surface Ag: NEGATIVE

## 2018-09-16 LAB — HIV ANTIBODY (ROUTINE TESTING W REFLEX): HIV Screen 4th Generation wRfx: NONREACTIVE

## 2018-09-16 MED ORDER — ALTEPLASE 2 MG IJ SOLR
2.0000 mg | Freq: Once | INTRAMUSCULAR | Status: AC | PRN
  Administered 2018-09-16: 03:00:00 2 mg
  Filled 2018-09-16: qty 2

## 2018-09-16 MED ORDER — INSULIN ASPART 100 UNIT/ML ~~LOC~~ SOLN
0.0000 [IU] | Freq: Three times a day (TID) | SUBCUTANEOUS | Status: DC
  Administered 2018-09-16 (×2): 1 [IU] via SUBCUTANEOUS
  Administered 2018-09-17 – 2018-09-18 (×4): 2 [IU] via SUBCUTANEOUS
  Administered 2018-09-18: 3 [IU] via SUBCUTANEOUS
  Administered 2018-09-19: 1 [IU] via SUBCUTANEOUS
  Administered 2018-09-20: 2 [IU] via SUBCUTANEOUS
  Administered 2018-09-20 (×2): 1 [IU] via SUBCUTANEOUS

## 2018-09-16 MED ORDER — SENNOSIDES-DOCUSATE SODIUM 8.6-50 MG PO TABS
2.0000 | ORAL_TABLET | Freq: Every evening | ORAL | Status: DC | PRN
  Administered 2018-09-21: 2 via ORAL
  Filled 2018-09-16: qty 2

## 2018-09-16 MED ORDER — POLYETHYLENE GLYCOL 3350 17 G PO PACK: 17.0000 g | PACK | Freq: Every day | ORAL | Status: DC | PRN

## 2018-09-16 MED ORDER — HEPARIN SODIUM (PORCINE) 1000 UNIT/ML IJ SOLN
INTRAMUSCULAR | Status: AC
  Filled 2018-09-16: qty 5

## 2018-09-16 MED ORDER — ALTEPLASE 2 MG IJ SOLR
2.0000 mg | Freq: Once | INTRAMUSCULAR | Status: AC | PRN
  Administered 2018-09-16: 2 mg
  Filled 2018-09-16: qty 2

## 2018-09-16 MED ORDER — HALOPERIDOL LACTATE 5 MG/ML IJ SOLN
5.0000 mg | Freq: Once | INTRAMUSCULAR | Status: AC
  Administered 2018-09-16: 5 mg via INTRAVENOUS
  Filled 2018-09-16: qty 1

## 2018-09-16 MED ORDER — HYDRALAZINE HCL 20 MG/ML IJ SOLN: 10.0000 mg | INTRAMUSCULAR | Status: DC | PRN

## 2018-09-16 MED ORDER — ALTEPLASE 2 MG IJ SOLR
INTRAMUSCULAR | Status: AC
  Administered 2018-09-16: 2 mg
  Filled 2018-09-16: qty 4

## 2018-09-16 NOTE — Progress Notes (Signed)
Patient ID: [name], female   DOB: [birth year], 58 y.o.   MRN: [id number]  [facility] Progress Note    Subjective:   Tolerated HD this morning, lethargic but arousable this morning.   Objective:   BP (!) 154/68 (BP Location: Left Arm)   Pulse 61   Temp 98.8 F (37.1 C) (Oral)   Resp 18   Ht 5\' 6"  (1.676 m)   Wt 57.8 kg   SpO2 100%   BMI 20.57 kg/m   Intake/Output: I/O last 3 completed shifts: In: 240 [P.O.:240] Out: 3000 [Other:3000]   Intake/Output this shift:  Total I/O In: 150 [P.O.:100; Other:50] Out: -  Weight change:   Physical Exam: Gen: WD AAF with periorbital edema and slowed mentation CVS: no rub Resp: bilateral rales Abd: +BS, soft, NT Ext: 1+ edema, clotted AVGs in RUE, right fem HD cath in place  Labs: BMET Recent Labs  Lab 09/15/18 1607 09/15/18 1844 09/16/18 0537  NA 124* 127*  --   K 7.2* 6.4*  --   CL 94* 95*  --   CO2 16* 16*  --   GLUCOSE 190* 46*  --   BUN 116* 116*  --   CREATININE 12.60* 12.63*  --   CALCIUM 9.5 8.8*  --   PHOS 7.1*  --  4.6   CBC Recent Labs  Lab 09/15/18 1607  WBC 7.7  NEUTROABS 6.1  HGB 9.5*  HCT 29.5*  MCV 89.7  PLT 269    @IMGRELPRIORS @ Medications:    . aspirin EC  81 mg Oral Daily  . benztropine  1 mg Oral Daily  . buPROPion  300 mg Oral Daily  . Chlorhexidine Gluconate Cloth  6 each Topical Q0600  . divalproex  500 mg Oral QHS  . docusate sodium  100 mg Oral Daily  . fluPHENAZine  5 mg Oral Daily  . heparin      . heparin  5,000 Units Subcutaneous Q8H  . insulin aspart  0-9 Units Subcutaneous TID WC  . latanoprost  1 drop Both Eyes QHS  . levothyroxine  25 mcg Oral QAC breakfast  . multivitamin  1 tablet Oral QHS  . risperiDONE  3 mg Oral QHS  . sevelamer carbonate  2,400 mg Oral TID WC  . simvastatin  40 mg Oral Daily  . sodium chloride flush  3 mL Intravenous Q12H     Assessment/ Plan:   1. AMS presumably due to Uremia from noncompliance with HD 2. Suicidal  ideations- pt with h/o bipolar disorder and will need psychiatric evaluation 3. ESRD missed HD for over a week with significant uremia s/p urgent HD early this morning due to hyperkalemia.  Will plan for HD again tomorrow morning and follow. 4. Anemia: follow h/h and contact HD unit for ESA rx 5. CKD-MBD: phos improved with binders 6. Nutrition: renal diet 7. Hypertension: improved with HD and UF.  Donetta Potts, MD [place] Pager [phone] 09/16/2018, 12:49 PM

## 2018-09-16 NOTE — Clinical Social Work Note (Signed)
CSW talked with [place] and confirmed that patient is from [facility] and is LTC at this facility. CSW will continue to follow, provide SW intervention services and assist with discharge when patient medically stable.  Shivani Barrantes Givens, MSW, LCSW Licensed Clinical Social Worker [place] [phone]

## 2018-09-16 NOTE — Progress Notes (Signed)
Inpatient Diabetes Program Recommendations  AACE/ADA: New Consensus Statement on Inpatient Glycemic Control (2015)  Target Ranges:  Prepandial:   less than 140 mg/dL      Peak postprandial:   less than 180 mg/dL (1-2 hours)      Critically ill patients:  140 - 180 mg/dL   Lab Results  Component Value Date   GLUCAP 180 (H) 09/15/2018   HGBA1C 6.5 (H) 10/21/2016    Review of Glycemic Control Results for [name], [name] (MRN [id number]) as of 09/16/2018 11:27  Ref. Range 09/15/2018 15:39  Glucose-Capillary Latest Ref Range: 70 - 99 mg/dL 180 (H)   Diabetes history: Type 2 DM Outpatient Diabetes medications: Lantus 10 units BID Current orders for Inpatient glycemic control: none Novolog 10 units x 1  Inpatient Diabetes Program Recommendations:    Noted patient experienced hypoglycemia of 46 mg/dL following Novolog 10 units x 1. Appears Kayexalate was given several hours following insulin dose.  No further CBGs per hypoglycemia protocol.   At this time consider: -CBGs FSBG, TID & HS  - Evaluate insulin needs from glucose trends.   Thanks, Bronson Curb, MSN, RNC-OB Diabetes Coordinator [phone] (8a-5p)

## 2018-09-16 NOTE — Progress Notes (Signed)
PROGRESS NOTE    [name]  [id number]:[id number] DOB: [birth year] DOA: 09/15/2018 PCP: Patient, No Pcp Per   Brief Narrative:  58 year old with history of ESRD on hemodialysis, diabetes mellitus type 2, essential hypertension, schizophrenia/bipolar disorder came to the hospital after missing her dialysis sessions for more than 1 week.  Overall she is a poor historian.  She was found to be volume overloaded with significant hyperkalemia of 7.2, sodium of 124, bicarb 16.  KG changes with slight peaked T waves, initially treated with calcium gluconate, sodium bicarb, Lokelma, Kayexalate.  Patient transferred to [facility] for urgent dialysis and nephrology consultation.  CT of the head was ordered due to somnolence.  Elevated 116.   Assessment & Plan:   Principal Problem:   Hyperkalemia Active Problems:   Altered mental status   ESRD (end stage renal disease) ([place])   Diabetes mellitus ([place])   ARF (acute renal failure) ([facility])   Hyponatremia  Hyperkalemia due to dialysis noncompliance ESRD on hemodialysis Monday Wednesday Friday -Nephrology team consulted.  Had urgent dialysis early in the morning.  Patient has received all the basic treatments for hyperkalemia.  We will continue to follow her labs.  She will at least require 2-3 inpatient sessions before she can be discharged.  Acute metabolic encephalopathy, likely uremic - CT head shows moderate atrophy but no acute intracranial pathology.  Hyponatremia -Secondary to being volume overload  Diabetes mellitus type 2, insulin-dependent -Accu-Cheks and sliding scale.  Holding home Lantus until her oral intake improves  Hyperlipidemia -Continue statin  History of schizophrenia bipolar disorder - Hold Risperdal, Prolixin, Cogentin and Depakote.  Psychiatry consulted by admitting provider.  This would benefit especially will need better mental stabilization to help with her noncompliance issue  Hypothyroidism -Continue Synthroid  DVT prophylaxis: Subcu heparin Code Status: Full code Family Communication: None at bedside Disposition Plan: Maintain hospital stay for further inpatient dialysis session, closely monitoring her labs and improving her mentation.  Currently her mental status is not at baseline and she is quite uremic.  Consultants:   Nephrology  Procedures:   None  Antimicrobials:   None   Subjective: Patient is awake but still quite confused.  She keeps on repeating for most of the things on saying.  No other focal neuro deficits.  Slightly tremulous as well.  Review of Systems Otherwise negative except as per HPI, including: General: Denies fever, chills, night sweats or unintended weight loss. Resp: Denies cough, wheezing, shortness of breath. Cardiac: Denies chest pain, palpitations, orthopnea, paroxysmal nocturnal dyspnea. GI: Denies abdominal pain, nausea, vomiting, diarrhea or constipation GU: Denies dysuria, frequency, hesitancy or incontinence MS: Denies muscle aches, joint pain or swelling Neuro: Denies headache, neurologic deficits (focal weakness, numbness, tingling), abnormal gait Psych: Denies anxiety, depression, SI/HI/AVH Skin: Denies new rashes or lesions ID: Denies sick contacts, exotic exposures, travel  Objective: Vitals:   09/16/18 0430 09/16/18 0446 09/16/18 0458 09/16/18 0526  BP: (!) 142/73 127/76 (!) 143/70 (!) 149/68  Pulse: (!) 58 60 63 61  Resp:   18 20  Temp:   98.5 F (36.9 C) 98.2 F (36.8 C)  TempSrc:   Oral Oral  SpO2:   100% 100%  Weight:   57.8 kg   Height:   5\' 6"  (1.676 m)     Intake/Output Summary (Last 24 hours) at 09/16/2018 0807 Last data filed at 09/16/2018 0600 Gross per 24 hour  Intake 240 ml  Output 3000 ml  Net -2760 ml   [employer]  09/15/18 1529 09/15/18 2351 09/16/18 0458  Weight: 68 kg 60.9 kg 57.8 kg    Examination:  General exam: Not in acute distress.  Only alert to her name. Respiratory system: Clear to  auscultation. Respiratory effort normal. Cardiovascular system: S1 & S2 heard, RRR. No JVD, murmurs, rubs, gallops or clicks. No pedal edema. Gastrointestinal system: Abdomen is nondistended, soft and nontender. No organomegaly or masses felt. Normal bowel sounds heard. Central nervous system: Alert and oriented. No focal neurological deficits.  Slightly tremulous Extremities: Symmetric 5 x 5 power. Skin: No rashes, lesions or ulcers Psychiatry: Judgement and insight appear poor. Mood & affect appropriate.     Data Reviewed:   CBC: Recent Labs  Lab 09/15/18 1607  WBC 7.7  NEUTROABS 6.1  HGB 9.5*  HCT 29.5*  MCV 89.7  PLT 034   Basic Metabolic Panel: Recent Labs  Lab 09/15/18 1607 09/15/18 1844 09/16/18 0537  NA 124* 127*  --   K 7.2* 6.4*  --   CL 94* 95*  --   CO2 16* 16*  --   GLUCOSE 190* 46*  --   BUN 116* 116*  --   CREATININE 12.60* 12.63*  --   CALCIUM 9.5 8.8*  --   MG 2.6*  --   --   PHOS 7.1*  --  4.6   GFR: Estimated Creatinine Clearance: 4.5 mL/min (A) (by C-G formula based on SCr of 12.63 mg/dL (H)). Liver Function Tests: No results for input(s): AST, ALT, ALKPHOS, BILITOT, PROT, ALBUMIN in the last 168 hours. No results for input(s): LIPASE, AMYLASE in the last 168 hours. No results for input(s): AMMONIA in the last 168 hours. Coagulation Profile: No results for input(s): INR, PROTIME in the last 168 hours. Cardiac Enzymes: No results for input(s): CKTOTAL, CKMB, CKMBINDEX, TROPONINI in the last 168 hours. BNP (last 3 results) No results for input(s): PROBNP in the last 8760 hours. HbA1C: No results for input(s): HGBA1C in the last 72 hours. CBG: Recent Labs  Lab 09/15/18 1539  GLUCAP 180*   Lipid Profile: No results for input(s): CHOL, HDL, LDLCALC, TRIG, CHOLHDL, LDLDIRECT in the last 72 hours. Thyroid Function Tests: No results for input(s): TSH, T4TOTAL, FREET4, T3FREE, THYROIDAB in the last 72 hours. Anemia Panel: No results for  input(s): VITAMINB12, FOLATE, FERRITIN, TIBC, IRON, RETICCTPCT in the last 72 hours. Sepsis Labs: No results for input(s): PROCALCITON, LATICACIDVEN in the last 168 hours.  Recent Results (from the past 240 hour(s))  SARS Coronavirus 2 (CEPHEID - Performed in [place] hospital lab), Hosp Order     Status: None   Collection Time: 09/15/18  3:49 PM  Result Value Ref Range Status   SARS Coronavirus 2 NEGATIVE NEGATIVE Final    Comment: (NOTE) If result is NEGATIVE SARS-CoV-2 target nucleic acids are NOT DETECTED. The SARS-CoV-2 RNA is generally detectable in upper and lower  respiratory specimens during the acute phase of infection. The lowest  concentration of SARS-CoV-2 viral copies this assay can detect is 250  copies / mL. A negative result does not preclude SARS-CoV-2 infection  and should not be used as the sole basis for treatment or other  patient management decisions.  A negative result may occur with  improper specimen collection / handling, submission of specimen other  than nasopharyngeal swab, presence of viral mutation(s) within the  areas targeted by this assay, and inadequate number of viral copies  (<250 copies / mL). A negative result must be combined with clinical  observations,  patient history, and epidemiological information. If result is POSITIVE SARS-CoV-2 target nucleic acids are DETECTED. The SARS-CoV-2 RNA is generally detectable in upper and lower  respiratory specimens dur ing the acute phase of infection.  Positive  results are indicative of active infection with SARS-CoV-2.  Clinical  correlation with patient history and other diagnostic information is  necessary to determine patient infection status.  Positive results do  not rule out bacterial infection or co-infection with other viruses. If result is PRESUMPTIVE POSTIVE SARS-CoV-2 nucleic acids MAY BE PRESENT.   A presumptive positive result was obtained on the submitted specimen  and confirmed on  repeat testing.  While 2019 novel coronavirus  (SARS-CoV-2) nucleic acids may be present in the submitted sample  additional confirmatory testing may be necessary for epidemiological  and / or clinical management purposes  to differentiate between  SARS-CoV-2 and other Sarbecovirus currently known to infect humans.  If clinically indicated additional testing with an alternate test  methodology [phone]) is advised. The SARS-CoV-2 RNA is generally  detectable in upper and lower respiratory sp ecimens during the acute  phase of infection. The expected result is Negative. Fact Sheet for Patients:  StrictlyIdeas.no Fact Sheet for Healthcare Providers: BankingDealers.co.za This test is not yet approved or cleared by the [place] FDA and has been authorized for detection and/or diagnosis of SARS-CoV-2 by FDA under an Emergency Use Authorization (EUA).  This EUA will remain in effect (meaning this test can be used) for the duration of the COVID-19 declaration under Section 564(b)(1) of the Act, 21 U.S.C. section 360bbb-3(b)(1), unless the authorization is terminated or revoked sooner. Performed at [facility], [address] [place], [place] [address]          Radiology Studies: No results found.      Scheduled Meds: . aspirin EC  81 mg Oral Daily  . benztropine  1 mg Oral Daily  . buPROPion  300 mg Oral Daily  . Chlorhexidine Gluconate Cloth  6 each Topical Q0600  . divalproex  500 mg Oral QHS  . docusate sodium  100 mg Oral Daily  . fluPHENAZine  5 mg Oral Daily  . heparin      . heparin  5,000 Units Subcutaneous Q8H  . latanoprost  1 drop Both Eyes QHS  . levothyroxine  25 mcg Oral QAC breakfast  . multivitamin  1 tablet Oral QHS  . risperiDONE  3 mg Oral QHS  . sevelamer carbonate  2,400 mg Oral TID WC  . simvastatin  40 mg Oral Daily  . sodium chloride flush  3 mL Intravenous Q12H   Continuous Infusions: . sodium  chloride       LOS: 1 day   Time spent= 40 mins    Jonnelle Lawniczak Arsenio Loader, MD [facility]  If 7PM-7AM, please contact night-coverage [email or web address] 09/16/2018, 8:07 AM

## 2018-09-16 NOTE — Progress Notes (Signed)
Pt is combative to both the RN and NT. Refusing to allow v/s and cbg to be taken. Pt trying to get out bed stating she wants to home. On call provider Bodenheimer made aware. Will continue to monitor.

## 2018-09-17 DIAGNOSIS — Z7189 Other specified counseling: Secondary | ICD-10-CM

## 2018-09-17 DIAGNOSIS — F200 Paranoid schizophrenia: Secondary | ICD-10-CM

## 2018-09-17 DIAGNOSIS — Z515 Encounter for palliative care: Secondary | ICD-10-CM

## 2018-09-17 LAB — GLUCOSE, CAPILLARY
Glucose-Capillary: 100 mg/dL — ABNORMAL HIGH (ref 70–99)
Glucose-Capillary: 181 mg/dL — ABNORMAL HIGH (ref 70–99)
Glucose-Capillary: 178 mg/dL — ABNORMAL HIGH (ref 70–99)

## 2018-09-17 NOTE — Progress Notes (Signed)
PROGRESS NOTE    [name]  DDU:[id number] DOB: 09/21/1960 DOA: 09/15/2018 PCP: Patient, No Pcp Per   Brief Narrative:  58 year old with history of ESRD on hemodialysis, diabetes mellitus type 2, essential hypertension, schizophrenia/bipolar disorder came to the hospital after missing her dialysis sessions for more than 1 week.  Overall she is a poor historian.  She was found to be volume overloaded with significant hyperkalemia of 7.2, sodium of 124, bicarb 16.  KG changes with slight peaked T waves, initially treated with calcium gluconate, sodium bicarb, Lokelma, Kayexalate.  Patient transferred to [facility] for urgent dialysis and nephrology consultation.  CT of the head was ordered due to somnolence.  Elevated 116.   Assessment & Plan:   Principal Problem:   Hyperkalemia Active Problems:   Altered mental status   ESRD (end stage renal disease) ([place])   Diabetes mellitus ([place])   ARF (acute renal failure) ([facility])   Hyponatremia  Hyperkalemia due to dialysis noncompliance ESRD on hemodialysis Monday Wednesday Friday - Nephrology team is following the patient but patient is refusing dialysis this morning.  Unfortunately we need to control her psych symptoms and stabilize it.  Until this happens it will be difficult for her to get her to agree for dialysis sessions.  Without it she has very poor prognosis, will consult palliative care team.  Acute metabolic encephalopathy, likely uremic - CT head shows moderate atrophy but no acute intracranial pathology.  Also component of psych  Hyponatremia -Secondary to being volume overload  Diabetes mellitus type 2, insulin-dependent -Accu-Cheks and sliding scale.  Holding home Lantus until her oral intake improves  Hyperlipidemia -Continue statin  History of schizophrenia bipolar disorder - Hold Risperdal, Prolixin, Cogentin and Depakote.  Awaiting psychiatry input.  Hypothyroidism -Continue Synthroid  DVT prophylaxis:  Subcu heparin Code Status: Full code Family Communication: None at bedside Disposition Plan: Not safe for patient to be discharged given her abnormal labs and needs psychiatric symptoms controlled.  Consultants:   Nephrology  Procedures:   None  Antimicrobials:   None   Subjective: Patient is slightly confused and adamantly refusing dialysis this morning.  Poor oral intake  Review of Systems Otherwise negative except as per HPI, including: Unable to obtain  Objective: Vitals:   09/16/18 0854 09/16/18 1758 09/17/18 0500 09/17/18 0920  BP: (!) 154/68 (!) 163/73  (!) 183/78  Pulse: 61 73  68  Resp: 18 18  18   Temp: 98.8 F (37.1 C) 97.8 F (36.6 C)  98 F (36.7 C)  TempSrc: Oral Oral  Oral  SpO2: 100% 100%  100%  Weight:   57.8 kg   Height:        Intake/Output Summary (Last 24 hours) at 09/17/2018 1325 Last data filed at 09/17/2018 1154 Gross per 24 hour  Intake 480 ml  Output --  Net 480 ml   [employer]   09/15/18 2351 09/16/18 0458 09/17/18 0500  Weight: 60.9 kg 57.8 kg 57.8 kg    Examination:  Constitutional: Not in acute distress but slightly lethargic.  Easily arousable with verbal stimulus. Eyes: PERRL, lids and conjunctivae normal ENMT: Mucous membranes are dry posterior pharynx clear of any exudate or lesions.Normal dentition.  Neck: normal, supple, no masses, no thyromegaly Respiratory: Basilar crackles Cardiovascular: Regular rate and rhythm, no murmurs / rubs / gallops.  1-2+ bilateral lower extremity pitting edema 2+ pedal pulses. No carotid bruits.  Abdomen: no tenderness, no masses palpated. No hepatosplenomegaly. Bowel sounds positive.  Musculoskeletal: Clotted right  upper extremity AV graft, has right femoral HD catheter. Skin: no rashes, lesions, ulcers. No induration Neurologic: Able to fully assess Psychiatric: Poor judgment.  Awake and alert to her name.    Data Reviewed:   CBC: Recent Labs  Lab 09/15/18 1607  WBC 7.7   NEUTROABS 6.1  HGB 9.5*  HCT 29.5*  MCV 89.7  PLT 081   Basic Metabolic Panel: Recent Labs  Lab 09/15/18 1607 09/15/18 1844 09/16/18 0537 09/16/18 2326  NA 124* 127*  --  128*  K 7.2* 6.4*  --  4.6  CL 94* 95*  --  92*  CO2 16* 16*  --  23  GLUCOSE 190* 46*  --  126*  BUN 116* 116*  --  61*  CREATININE 12.60* 12.63*  --  8.12*  CALCIUM 9.5 8.8*  --  8.4*  MG 2.6*  --   --   --   PHOS 7.1*  --  4.6 6.4*   GFR: Estimated Creatinine Clearance: 7 mL/min (A) (by C-G formula based on SCr of 8.12 mg/dL (H)). Liver Function Tests: Recent Labs  Lab 09/16/18 2326  ALBUMIN 2.5*   No results for input(s): LIPASE, AMYLASE in the last 168 hours. No results for input(s): AMMONIA in the last 168 hours. Coagulation Profile: No results for input(s): INR, PROTIME in the last 168 hours. Cardiac Enzymes: No results for input(s): CKTOTAL, CKMB, CKMBINDEX, TROPONINI in the last 168 hours. BNP (last 3 results) No results for input(s): PROBNP in the last 8760 hours. HbA1C: No results for input(s): HGBA1C in the last 72 hours. CBG: Recent Labs  Lab 09/15/18 1539 09/16/18 1316 09/16/18 1712 09/17/18 0735 09/17/18 1126  GLUCAP 180* 129* 142* 100* 181*   Lipid Profile: No results for input(s): CHOL, HDL, LDLCALC, TRIG, CHOLHDL, LDLDIRECT in the last 72 hours. Thyroid Function Tests: No results for input(s): TSH, T4TOTAL, FREET4, T3FREE, THYROIDAB in the last 72 hours. Anemia Panel: No results for input(s): VITAMINB12, FOLATE, FERRITIN, TIBC, IRON, RETICCTPCT in the last 72 hours. Sepsis Labs: No results for input(s): PROCALCITON, LATICACIDVEN in the last 168 hours.  Recent Results (from the past 240 hour(s))  SARS Coronavirus 2 (CEPHEID - Performed in [place] hospital lab), Hosp Order     Status: None   Collection Time: 09/15/18  3:49 PM  Result Value Ref Range Status   SARS Coronavirus 2 NEGATIVE NEGATIVE Final    Comment: (NOTE) If result is NEGATIVE SARS-CoV-2 target  nucleic acids are NOT DETECTED. The SARS-CoV-2 RNA is generally detectable in upper and lower  respiratory specimens during the acute phase of infection. The lowest  concentration of SARS-CoV-2 viral copies this assay can detect is 250  copies / mL. A negative result does not preclude SARS-CoV-2 infection  and should not be used as the sole basis for treatment or other  patient management decisions.  A negative result may occur with  improper specimen collection / handling, submission of specimen other  than nasopharyngeal swab, presence of viral mutation(s) within the  areas targeted by this assay, and inadequate number of viral copies  (<250 copies / mL). A negative result must be combined with clinical  observations, patient history, and epidemiological information. If result is POSITIVE SARS-CoV-2 target nucleic acids are DETECTED. The SARS-CoV-2 RNA is generally detectable in upper and lower  respiratory specimens dur ing the acute phase of infection.  Positive  results are indicative of active infection with SARS-CoV-2.  Clinical  correlation with patient history and other diagnostic  information is  necessary to determine patient infection status.  Positive results do  not rule out bacterial infection or co-infection with other viruses. If result is PRESUMPTIVE POSTIVE SARS-CoV-2 nucleic acids MAY BE PRESENT.   A presumptive positive result was obtained on the submitted specimen  and confirmed on repeat testing.  While 2019 novel coronavirus  (SARS-CoV-2) nucleic acids may be present in the submitted sample  additional confirmatory testing may be necessary for epidemiological  and / or clinical management purposes  to differentiate between  SARS-CoV-2 and other Sarbecovirus currently known to infect humans.  If clinically indicated additional testing with an alternate test  methodology [phone]) is advised. The SARS-CoV-2 RNA is generally  detectable in upper and lower  respiratory sp ecimens during the acute  phase of infection. The expected result is Negative. Fact Sheet for Patients:  StrictlyIdeas.no Fact Sheet for Healthcare Providers: BankingDealers.co.za This test is not yet approved or cleared by the [place] FDA and has been authorized for detection and/or diagnosis of SARS-CoV-2 by FDA under an Emergency Use Authorization (EUA).  This EUA will remain in effect (meaning this test can be used) for the duration of the COVID-19 declaration under Section 564(b)(1) of the Act, 21 U.S.C. section 360bbb-3(b)(1), unless the authorization is terminated or revoked sooner. Performed at [facility], [address] [place], [place] [address]   MRSA PCR Screening     Status: None   Collection Time: 09/16/18 10:01 AM  Result Value Ref Range Status   MRSA by PCR NEGATIVE NEGATIVE Final    Comment:        The GeneXpert MRSA Assay (FDA approved for NASAL specimens only), is one component of a comprehensive MRSA colonization surveillance program. It is not intended to diagnose MRSA infection nor to guide or monitor treatment for MRSA infections. Performed at [facility], [place] [address] [place], [place] [address]          Radiology Studies: Ct Head Wo Contrast  Result Date: 09/16/2018 CLINICAL DATA:  Altered mental status. History of schizophrenia and bipolar disorder EXAM: CT HEAD WITHOUT CONTRAST TECHNIQUE: Contiguous axial images were obtained from the base of the skull through the vertex without intravenous contrast. COMPARISON:  October 04, 2017 FINDINGS: Brain: There is moderate diffuse atrophy. There is no intracranial mass, hemorrhage, extra-axial fluid collection, or midline shift. There is minimal small vessel disease in centra semiovale bilaterally. No evident acute infarct. Vascular: There is no hyperdense vessel. There are foci of calcification in each distal vertebral artery as well  as in each carotid siphon region. Skull: The bony calvarium appears intact. Sinuses/Orbits: There is mucosal thickening in several ethmoid air cells. Visualized paranasal sinuses otherwise are clear. Orbits appear symmetric bilaterally. Other: Mastoids are clear. Mastoids on the left are somewhat hypoplastic. IMPRESSION: Moderate atrophy with slight periventricular small vessel disease. No acute infarct. No mass or hemorrhage. There are foci of arterial vascular calcification. There is mucosal thickening in several ethmoid air cells. Electronically Signed   By: Lowella Grip III M.D.   On: 09/16/2018 09:05        Scheduled Meds:  aspirin EC  81 mg Oral Daily   benztropine  1 mg Oral Daily   buPROPion  300 mg Oral Daily   Chlorhexidine Gluconate Cloth  6 each Topical Q0600   divalproex  500 mg Oral QHS   docusate sodium  100 mg Oral Daily   fluPHENAZine  5 mg Oral Daily   heparin  5,000  Units Subcutaneous Q8H   insulin aspart  0-9 Units Subcutaneous TID WC   latanoprost  1 drop Both Eyes QHS   levothyroxine  25 mcg Oral QAC breakfast   multivitamin  1 tablet Oral QHS   risperiDONE  3 mg Oral QHS   sevelamer carbonate  2,400 mg Oral TID WC   simvastatin  40 mg Oral Daily   sodium chloride flush  3 mL Intravenous Q12H   Continuous Infusions:  sodium chloride       LOS: 2 days   Time spent= 35 mins    Arlean Thies Arsenio Loader, MD [facility]  If 7PM-7AM, please contact night-coverage [email or web address] 09/17/2018, 1:25 PM

## 2018-09-17 NOTE — Progress Notes (Signed)
Renal Navigator notified OP HD clinic/[place] of patient's hospitalization and negative COVID 19 rapid test result in order to provide continuity of care. Renal Navigator spoke with Clinic Manager/D. Deshazor regarding patient's attendance in the OP HD clinic and how she is refusing HD in the hospital. Clinic Manager states that this is not new for patient and that she frequently misses treatment. She states she has marginal family support, but that she thinks that her family encourages her to keep going to treatment. She thinks patient is "tired," and lacks motivation. Renal Navigator informed Clinic Manager of consults placed for Psychiatry Consult and [place] and will continue to follow closely.  Alphonzo Cruise,  Renal Navigator [phone]

## 2018-09-17 NOTE — Consult Note (Signed)
[facility] Consult   Reason for Consult:  SI Referring Physician:  Dr. Gerlean Ren  Patient Identification: [name] MRN:  [id number] Principal Diagnosis: Schizophrenia [facility]) Diagnosis:  Principal Problem:   Hyperkalemia Active Problems:   Altered mental status   ESRD (end stage renal disease) ([place])   Diabetes mellitus ([place])   ARF (acute renal failure) ([place])   Hyponatremia   Total Time spent with patient: 1 hour  Subjective:   [name] is a 58 y.o. female patient admitted with hyperkalemia due to multiple missed dialysis sessions.  HPI:   Per chart review, [name] was admitted with hyperkalemia due to missed hemodialysis for the past week. She endorsed SI so psychiatry was consulted. On interview, [name] reports that she was upset that she is still in the hospital because she would like to return to her facility. She denies any intention to harm herself and reports feeling safe at her facility. She enjoys living there. She denies HI or AVH. She reports medication compliance. It was difficult to obtain further information since patient is a poor historian and due to slurred speech at times.   Past Psychiatric History: Schizophrenia and bipolar 1 disorder.   Risk to Self:  None. Denies current SI.  Risk to Others:  None. Denies HI.  Prior Inpatient Therapy:   None per chart review.  Prior Outpatient Therapy:  Her medications are managed by her outpatient provider.   Past Medical History:  Past Medical History:  Diagnosis Date  . Bipolar 1 disorder ([place])   . Diabetes mellitus without complication ([place])   . Hypertension   . Schizophrenia [facility])     Past Surgical History:  Procedure Laterality Date  . IR FLUORO GUIDE CV LINE LEFT  10/08/2017  . IR US GUIDE VASC ACCESS LEFT  10/08/2017   Family History:  Family History  Problem Relation Age of Onset  . Diabetes Mother    Family Psychiatric  History:  Denies  Social History:  Social History    Substance and Sexual Activity  Alcohol Use No     Social History   Substance and Sexual Activity  Drug Use No    Social History   Socioeconomic History  . Marital status: Single    Spouse name: Not on file  . Number of children: Not on file  . Years of education: Not on file  . Highest education level: Not on file  Occupational History  . Not on file  Social Needs  . Financial resource strain: Not on file  . Food insecurity:    Worry: Not on file    Inability: Not on file  . Transportation needs:    Medical: Not on file    Non-medical: Not on file  Tobacco Use  . Smoking status: Former Smoker    Types: Cigarettes  . Smokeless tobacco: Never Used  Substance and Sexual Activity  . Alcohol use: No  . Drug use: No  . Sexual activity: Not on file  Lifestyle  . Physical activity:    Days per week: Not on file    Minutes per session: Not on file  . Stress: Not on file  Relationships  . Social connections:    Talks on phone: Not on file    Gets together: Not on file    Attends religious service: Not on file    Active member of club or organization: Not on file    Attends meetings of clubs or organizations:  Not on file    Relationship status: Not on file  Other Topics Concern  . Not on file  Social History Narrative  . Not on file   Additional Social History: She resides at [facility]. She denies alcohol or illicit substance use.     Allergies:  No Known Allergies  Labs:  Results for orders placed or performed during the hospital encounter of 09/15/18 (from the past 48 hour(s))  POC CBG, ED     Status: Abnormal   Collection Time: 09/15/18  3:39 PM  Result Value Ref Range   Glucose-Capillary 180 (H) 70 - 99 mg/dL  SARS Coronavirus 2 (CEPHEID - Performed in [facility] hospital lab), Hosp Order     Status: None   Collection Time: 09/15/18  3:49 PM  Result Value Ref Range   SARS Coronavirus 2 NEGATIVE NEGATIVE    Comment: (NOTE) If result is  NEGATIVE SARS-CoV-2 target nucleic acids are NOT DETECTED. The SARS-CoV-2 RNA is generally detectable in upper and lower  respiratory specimens during the acute phase of infection. The lowest  concentration of SARS-CoV-2 viral copies this assay can detect is 250  copies / mL. A negative result does not preclude SARS-CoV-2 infection  and should not be used as the sole basis for treatment or other  patient management decisions.  A negative result may occur with  improper specimen collection / handling, submission of specimen other  than nasopharyngeal swab, presence of viral mutation(s) within the  areas targeted by this assay, and inadequate number of viral copies  (<250 copies / mL). A negative result must be combined with clinical  observations, patient history, and epidemiological information. If result is POSITIVE SARS-CoV-2 target nucleic acids are DETECTED. The SARS-CoV-2 RNA is generally detectable in upper and lower  respiratory specimens dur ing the acute phase of infection.  Positive  results are indicative of active infection with SARS-CoV-2.  Clinical  correlation with patient history and other diagnostic information is  necessary to determine patient infection status.  Positive results do  not rule out bacterial infection or co-infection with other viruses. If result is PRESUMPTIVE POSTIVE SARS-CoV-2 nucleic acids MAY BE PRESENT.   A presumptive positive result was obtained on the submitted specimen  and confirmed on repeat testing.  While 2019 novel coronavirus  (SARS-CoV-2) nucleic acids may be present in the submitted sample  additional confirmatory testing may be necessary for epidemiological  and / or clinical management purposes  to differentiate between  SARS-CoV-2 and other Sarbecovirus currently known to infect humans.  If clinically indicated additional testing with an alternate test  methodology [phone]) is advised. The SARS-CoV-2 RNA is generally  detectable  in upper and lower respiratory sp ecimens during the acute  phase of infection. The expected result is Negative. Fact Sheet for Patients:  StrictlyIdeas.no Fact Sheet for Healthcare Providers: BankingDealers.co.za This test is not yet approved or cleared by the [place] FDA and has been authorized for detection and/or diagnosis of SARS-CoV-2 by FDA under an Emergency Use Authorization (EUA).  This EUA will remain in effect (meaning this test can be used) for the duration of the COVID-19 declaration under Section 564(b)(1) of the Act, 21 U.S.C. section 360bbb-3(b)(1), unless the authorization is terminated or revoked sooner. Performed at [facility], [address] [place], [place] [address]   Basic metabolic panel     Status: Abnormal   Collection Time: 09/15/18  4:07 PM  Result Value Ref Range   Sodium 124 (L)  135 - 145 mmol/L   Potassium 7.2 (HH) 3.5 - 5.1 mmol/L    Comment: CRITICAL RESULT CALLED TO, READ BACK BY AND VERIFIED WITH: MINTER,R ON 09/15/18 AT 1715 BY LOY,C    Chloride 94 (L) 98 - 111 mmol/L   CO2 16 (L) 22 - 32 mmol/L   Glucose, Bld 190 (H) 70 - 99 mg/dL   BUN 116 (H) 6 - 20 mg/dL    Comment: RESULTS CONFIRMED BY MANUAL DILUTION   Creatinine, Ser 12.60 (H) 0.44 - 1.00 mg/dL   Calcium 9.5 8.9 - 10.3 mg/dL   GFR calc non Af Amer 3 (L) >60 mL/min   GFR calc Af Amer 3 (L) >60 mL/min   Anion gap 14 5 - 15    Comment: Performed at [facility], [address] [place], [place] [address]  Magnesium     Status: Abnormal   Collection Time: 09/15/18  4:07 PM  Result Value Ref Range   Magnesium 2.6 (H) 1.7 - 2.4 mg/dL    Comment: Performed at [facility], [address], [place], [place] [address]  Phosphorus     Status: Abnormal   Collection Time: 09/15/18  4:07 PM  Result Value Ref Range   Phosphorus 7.1 (H) 2.5 - 4.6 mg/dL    Comment: Performed at [facility], [address], [place], [place] [address]  CBC with  Differential     Status: Abnormal   Collection Time: 09/15/18  4:07 PM  Result Value Ref Range   WBC 7.7 4.0 - 10.5 K/uL   RBC 3.29 (L) 3.87 - 5.11 MIL/uL   Hemoglobin 9.5 (L) 12.0 - 15.0 g/dL   HCT 29.5 (L) 36.0 - 46.0 %   MCV 89.7 80.0 - 100.0 fL   MCH 28.9 26.0 - 34.0 pg   MCHC 32.2 30.0 - 36.0 g/dL   RDW 15.6 (H) 11.5 - 15.5 %   Platelets 269 150 - 400 K/uL   nRBC 0.0 0.0 - 0.2 %   Neutrophils Relative % 80 %   Neutro Abs 6.1 1.7 - 7.7 K/uL   Lymphocytes Relative 11 %   Lymphs Abs 0.9 0.7 - 4.0 K/uL   Monocytes Relative 8 %   Monocytes Absolute 0.7 0.1 - 1.0 K/uL   Eosinophils Relative 1 %   Eosinophils Absolute 0.1 0.0 - 0.5 K/uL   Basophils Relative 0 %   Basophils Absolute 0.0 0.0 - 0.1 K/uL   Immature Granulocytes 0 %   Abs Immature Granulocytes 0.02 0.00 - 0.07 K/uL    Comment: Performed at [facility], [address], [place], [place] [address]  HIV antibody (Routine Testing)     Status: None   Collection Time: 09/15/18  4:07 PM  Result Value Ref Range   HIV Screen 4th Generation wRfx Non Reactive Non Reactive    Comment: (NOTE) Performed At: [facility] [place], [place] [id number] Rush Farmer MD DX:[id number]   Basic metabolic panel     Status: Abnormal   Collection Time: 09/15/18  6:44 PM  Result Value Ref Range   Sodium 127 (L) 135 - 145 mmol/L   Potassium 6.4 (HH) 3.5 - 5.1 mmol/L    Comment: CRITICAL RESULT CALLED TO, READ BACK BY AND VERIFIED WITH: DOSS,M ON 09/15/18 AT 1950 BY LOY,C    Chloride 95 (L) 98 - 111 mmol/L   CO2 16 (L) 22 - 32 mmol/L   Glucose, Bld 46 (L) 70 - 99 mg/dL   BUN 116 (H)  6 - 20 mg/dL    Comment: RESULTS CONFIRMED BY MANUAL DILUTION   Creatinine, Ser 12.63 (H) 0.44 - 1.00 mg/dL   Calcium 8.8 (L) 8.9 - 10.3 mg/dL   GFR calc non Af Amer 3 (L) >60 mL/min   GFR calc Af Amer 3 (L) >60 mL/min   Anion gap 16 (H) 5 - 15    Comment: Performed at [facility], [address] [place], [place] [address]   Hepatitis B surface antigen     Status: None   Collection Time: 09/16/18  2:59 AM  Result Value Ref Range   Hepatitis B Surface Ag Negative Negative    Comment: (NOTE) Performed At: [facility] [place], [place] [id number] Rush Farmer MD YW:[id number]   Phosphorus     Status: None   Collection Time: 09/16/18  5:37 AM  Result Value Ref Range   Phosphorus 4.6 2.5 - 4.6 mg/dL    Comment: Performed at [facility], [place] [address] [place], [place] [address]  MRSA PCR Screening     Status: None   Collection Time: 09/16/18 10:01 AM  Result Value Ref Range   MRSA by PCR NEGATIVE NEGATIVE    Comment:        The GeneXpert MRSA Assay (FDA approved for NASAL specimens only), is one component of a comprehensive MRSA colonization surveillance program. It is not intended to diagnose MRSA infection nor to guide or monitor treatment for MRSA infections. Performed at [facility], [place] [address], [place], [place] [address]   Glucose, capillary     Status: Abnormal   Collection Time: 09/16/18  1:16 PM  Result Value Ref Range   Glucose-Capillary 129 (H) 70 - 99 mg/dL  Glucose, capillary     Status: Abnormal   Collection Time: 09/16/18  5:12 PM  Result Value Ref Range   Glucose-Capillary 142 (H) 70 - 99 mg/dL  Renal function panel     Status: Abnormal   Collection Time: 09/16/18 11:26 PM  Result Value Ref Range   Sodium 128 (L) 135 - 145 mmol/L   Potassium 4.6 3.5 - 5.1 mmol/L   Chloride 92 (L) 98 - 111 mmol/L   CO2 23 22 - 32 mmol/L   Glucose, Bld 126 (H) 70 - 99 mg/dL   BUN 61 (H) 6 - 20 mg/dL   Creatinine, Ser 8.12 (H) 0.44 - 1.00 mg/dL   Calcium 8.4 (L) 8.9 - 10.3 mg/dL   Phosphorus 6.4 (H) 2.5 - 4.6 mg/dL   Albumin 2.5 (L) 3.5 - 5.0 g/dL   GFR calc non Af Amer 5 (L) >60 mL/min   GFR calc Af Amer 6 (L) >60 mL/min   Anion gap 13 5 - 15    Comment: Performed at [facility], [address] [place], [place] [address]  Glucose,  capillary     Status: Abnormal   Collection Time: 09/17/18  7:35 AM  Result Value Ref Range   Glucose-Capillary 100 (H) 70 - 99 mg/dL  Glucose, capillary     Status: Abnormal   Collection Time: 09/17/18 11:26 AM  Result Value Ref Range   Glucose-Capillary 181 (H) 70 - 99 mg/dL    Current Facility-Administered Medications  Medication Dose Route Frequency Provider Last Rate Last Dose  . 0.9 %  sodium chloride infusion  250 mL Intravenous PRN Jani Gravel, MD      . acetaminophen (TYLENOL) tablet 650 mg  650 mg Oral Q6H PRN Maudie Mercury,  Jeneen Rinks, MD       Or  . acetaminophen (TYLENOL) suppository 650 mg  650 mg Rectal Q6H PRN Jani Gravel, MD      . aspirin EC tablet 81 mg  81 mg Oral Daily Jani Gravel, MD   81 mg at 09/17/18 0900  . benztropine (COGENTIN) tablet 1 mg  1 mg Oral Daily Jani Gravel, MD   1 mg at 09/17/18 1030  . buPROPion (WELLBUTRIN XL) 24 hr tablet 300 mg  300 mg Oral Daily Jani Gravel, MD   300 mg at 09/17/18 0900  . Chlorhexidine Gluconate Cloth 2 % PADS 6 each  6 each Topical [id number] Mauricia Area, MD   6 each at 09/17/18 0549  . divalproex (DEPAKOTE) DR tablet 500 mg  500 mg Oral QHS Jani Gravel, MD   500 mg at 09/16/18 2100  . docusate sodium (COLACE) capsule 100 mg  100 mg Oral Daily Jani Gravel, MD   100 mg at 09/17/18 0900  . fluPHENAZine (PROLIXIN) tablet 5 mg  5 mg Oral Daily Amin, Ankit Chirag, MD      . heparin injection 5,000 Units  5,000 Units Subcutaneous Q8H Jani Gravel, MD   5,000 Units at 09/17/18 [phone]  . hydrALAZINE (APRESOLINE) injection 10 mg  10 mg Intravenous Q4H PRN Amin, Ankit Chirag, MD      . insulin aspart (novoLOG) injection 0-9 Units  0-9 Units Subcutaneous TID WC Amin, Jeanella Flattery, MD   2 Units at 09/17/18 1239  . latanoprost (XALATAN) 0.005 % ophthalmic solution 1 drop  1 drop Both Eyes QHS Jani Gravel, MD      . levothyroxine (SYNTHROID) tablet 25 mcg  25 mcg Oral QAC breakfast Jani Gravel, MD   25 mcg at 09/17/18 [phone]  . multivitamin (RENA-VIT) tablet 1 tablet   1 tablet Oral Nile Riggs, MD   1 tablet at 09/16/18 2100  . polyethylene glycol (MIRALAX / GLYCOLAX) packet 17 g  17 g Oral Daily PRN Amin, Ankit Chirag, MD      . risperiDONE (RISPERDAL) tablet 3 mg  3 mg Oral QHS Jani Gravel, MD   3 mg at 09/16/18 2221  . senna-docusate (Senokot-S) tablet 2 tablet  2 tablet Oral QHS PRN Amin, Ankit Chirag, MD      . sevelamer carbonate (RENVELA) tablet 2,400 mg  2,400 mg Oral TID WC Mauricia Area, MD   2,400 mg at 09/17/18 1239  . simvastatin (ZOCOR) tablet 40 mg  40 mg Oral Daily Jani Gravel, MD   40 mg at 09/17/18 0900  . sodium chloride flush (NS) 0.9 % injection 3 mL  3 mL Intravenous Q12H Jani Gravel, MD   3 mL at 09/17/18 0901  . sodium chloride flush (NS) 0.9 % injection 3 mL  3 mL Intravenous PRN Jani Gravel, MD        Musculoskeletal: Strength & Muscle Tone: No atrophy noted. Gait & Station: UTA since patient is lying in bed. Patient leans: N/A  Psychiatric Specialty Exam: Physical Exam  Nursing note and vitals reviewed. Constitutional: She is oriented to person, place, and time. She appears well-developed and well-nourished.  HENT:  Head: Normocephalic and atraumatic.  Neck: Normal range of motion.  Respiratory: Effort normal.  Musculoskeletal: Normal range of motion.  Neurological: She is alert and oriented to person, place, and time.  Psychiatric: She has a normal mood and affect. Her behavior is normal. Judgment and thought content normal. Her speech is slurred. Cognition and memory are impaired.  Review of Systems  Gastrointestinal: Negative for constipation, diarrhea, nausea and vomiting.  Psychiatric/Behavioral: Negative for hallucinations, substance abuse and suicidal ideas.  All other systems reviewed and are negative.   Blood pressure (!) 183/78, pulse 68, temperature 98 F (36.7 C), temperature source Oral, resp. rate 18, height 5\' 6"  (1.676 m), weight 57.8 kg, SpO2 100 %.Body mass index is 20.57 kg/m.  General  Appearance: Fairly Groomed, middle aged, African American female, wearing a hospital gown who is lying in bed. NAD.   Eye Contact:  Good  Speech:  Slurred  Volume:  Normal  Mood:  Dysphoric  Affect:  Congruent  Thought Process:  Goal Directed, Linear and Descriptions of Associations: Intact  Orientation:  Full (Time, Place, and Person)  Thought Content:  Logical  Suicidal Thoughts:  No  Homicidal Thoughts:  No  Memory:  Immediate;   Fair Recent;   Fair Remote;   Fair  Judgement:  Poor  Insight:  Fair  Psychomotor Activity:  Normal  Concentration:  Concentration: Fair and Attention Span: Fair  Recall:  [employer] of Knowledge:  Fair  Language:  Fair  Akathisia:  No  Handed:  Right  AIMS (if indicated):   N/A  Assets:  Housing Resilience Social Support  ADL's:  Impaired  Cognition: Impaired with difficulties comprehending information.   Sleep:   N/A   Assessment:  [name] is a 58 y.o. female who was admitted with hyperkalemia due to multiple missed dialysis sessions. Psychiatry was consulted due to patient endorsing SI in the setting of frustration with being in the hospital. She denies any intention to harm self and reports just wanting to go back to her facility. She feels safe there. She has not demonstrated any unsafe behaviors in the hospital. She denies HI or AVH and does not appear to be responding to internal stimuli. She does not warrant inpatient psychiatric hospitalization at this time.   Treatment Plan Summary: -Continue cogentin 1 mg daily for EPS prophylaxis.  -Continue Wellbutrin 300 mg daily for depression. -Continue Depakote 500 mg qhs for mood stabilization. -Continue Prolixin 5 mg daily for psychosis.  -Continue Risperdal 3 mg qhs for psychosis.  -EKG reviewed and QTc 373 on 5/27. Please closely monitor when starting or increasing QTc prolonging agents.  -Patient should continue to follow up with her outpatient provider for medication management.   -Psychiatry will sign off on patient at this time. Please consult psychiatry again as needed.   Disposition: No evidence of imminent risk to self or others at present.   Patient does not meet criteria for psychiatric inpatient admission.  Faythe Dingwall, DO 09/17/2018 1:21 PM

## 2018-09-17 NOTE — NC FL2 (Signed)
[place] LEVEL OF CARE SCREENING TOOL     IDENTIFICATION  Patient Name: [name] Birthdate: 1960/07/12 Sex: female Admission Date (Current Location): 09/15/2018  [facility] and [place] Number:  Herbalist and Address:  [facility], [place] [address] [place], [place] [address]      Provider Number: [id number]  Attending Physician Name and Address:  Damita Lack, MD  Relative Name and Phone Number:  Catherina, Pates, [phone]    Current Level of Care: Hospital Recommended Level of Care: [facility] Prior Approval Number:    Date Approved/Denied: 12/30/17 PASRR Number: [id number]  Discharge Plan: SNF    Current Diagnoses: Patient Active Problem List   Diagnosis Date Noted  . ARF (acute renal failure) ([place]) 09/15/2018  . Hyponatremia 09/15/2018  . 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 Schizophrenia ([place]) 10/04/2017  . Hyperkalemia 10/04/2017  . Anemia in ESRD (end-stage renal disease) ([place]) 10/04/2017  . Dysphasia   . Altered mental status 10/22/2016  . ESRD (end stage renal disease) ([place]) 10/22/2016  . Bipolar 1 disorder ([place]) 10/22/2016  . Diabetes mellitus ([place]) 10/22/2016  . Essential hypertension 10/22/2016  . Acute encephalopathy 10/22/2016    Orientation RESPIRATION BLADDER Height & Weight     Self, Time, Place  Normal Incontinent Weight: 127 lb 6.8 oz (57.8 kg)(pt refused. Weight is from 09/16/2018 ) Height:  5\' 6"  (167.6 cm)  BEHAVIORAL SYMPTOMS/MOOD NEUROLOGICAL BOWEL NUTRITION STATUS      Continent Diet(Renal/Carb mod, no snacks, fluid restriction 1217ml, thin liquids)  AMBULATORY STATUS COMMUNICATION OF NEEDS Skin   Limited Assist Verbally Normal                       Personal Care Assistance Level of Assistance  Bathing, Feeding, Dressing, Total care Bathing Assistance: Limited assistance Feeding assistance: Independent Dressing Assistance:  Limited assistance Total Care Assistance: Limited assistance   Functional Limitations Info  Sight, Hearing, Speech Sight Info: Adequate Hearing Info: Adequate Speech Info: Adequate    SPECIAL CARE FACTORS FREQUENCY  PT (By licensed PT), OT (By licensed OT)     PT Frequency: 5x/wk OT Frequency: 5x/wk            Contractures Contractures Info: Not present    Additional Factors Info  Code Status, Insulin Sliding Scale Code Status Info: Full Code     Insulin Sliding Scale Info: insulin aspart novolog 0-9 units 3x daily w/meals       Current Medications (09/17/2018):  This is the current hospital active medication list Current Facility-Administered Medications  Medication Dose Route Frequency Provider Last Rate Last Dose  . 0.9 %  sodium chloride infusion  250 mL Intravenous PRN Jani Gravel, MD      . acetaminophen (TYLENOL) tablet 650 mg  650 mg Oral Q6H PRN Jani Gravel, MD       Or  . acetaminophen (TYLENOL) suppository 650 mg  650 mg Rectal Q6H PRN Jani Gravel, MD      . aspirin EC tablet 81 mg  81 mg Oral Daily Jani Gravel, MD   81 mg at 09/17/18 0900  . benztropine (COGENTIN) tablet 1 mg  1 mg Oral Daily Jani Gravel, MD   1 mg at 09/17/18 1030  . buPROPion (WELLBUTRIN XL) 24 hr tablet 300 mg  300 mg Oral Daily Jani Gravel, MD   300 mg at 09/17/18 0900  . Chlorhexidine Gluconate Cloth 2 %  PADS 6 each  6 each Topical [id number] Mauricia Area, MD   6 each at 09/17/18 0549  . divalproex (DEPAKOTE) DR tablet 500 mg  500 mg Oral QHS Jani Gravel, MD   500 mg at 09/16/18 2100  . docusate sodium (COLACE) capsule 100 mg  100 mg Oral Daily Jani Gravel, MD   100 mg at 09/17/18 0900  . fluPHENAZine (PROLIXIN) tablet 5 mg  5 mg Oral Daily Amin, Ankit Chirag, MD      . heparin injection 5,000 Units  5,000 Units Subcutaneous Q8H Jani Gravel, MD   5,000 Units at 09/17/18 1418  . hydrALAZINE (APRESOLINE) injection 10 mg  10 mg Intravenous Q4H PRN Amin, Ankit Chirag, MD      . insulin aspart  (novoLOG) injection 0-9 Units  0-9 Units Subcutaneous TID WC Amin, Jeanella Flattery, MD   2 Units at 09/17/18 1239  . latanoprost (XALATAN) 0.005 % ophthalmic solution 1 drop  1 drop Both Eyes QHS Jani Gravel, MD      . levothyroxine (SYNTHROID) tablet 25 mcg  25 mcg Oral QAC breakfast Jani Gravel, MD   25 mcg at 09/17/18 [phone]  . multivitamin (RENA-VIT) tablet 1 tablet  1 tablet Oral Nile Riggs, MD   1 tablet at 09/16/18 2100  . polyethylene glycol (MIRALAX / GLYCOLAX) packet 17 g  17 g Oral Daily PRN Amin, Ankit Chirag, MD      . risperiDONE (RISPERDAL) tablet 3 mg  3 mg Oral QHS Jani Gravel, MD   3 mg at 09/16/18 2221  . senna-docusate (Senokot-S) tablet 2 tablet  2 tablet Oral QHS PRN Amin, Ankit Chirag, MD      . sevelamer carbonate (RENVELA) tablet 2,400 mg  2,400 mg Oral TID WC Mauricia Area, MD   2,400 mg at 09/17/18 1239  . simvastatin (ZOCOR) tablet 40 mg  40 mg Oral Daily Jani Gravel, MD   40 mg at 09/17/18 0900  . sodium chloride flush (NS) 0.9 % injection 3 mL  3 mL Intravenous Q12H Jani Gravel, MD   3 mL at 09/17/18 0901  . sodium chloride flush (NS) 0.9 % injection 3 mL  3 mL Intravenous PRN Jani Gravel, MD         Discharge Medications: Please see discharge summary for a list of discharge medications.  Relevant Imaging Results:  Relevant Lab Results:   Additional Information SSN: [id number]  Philippa Chester Gregori Abril, LCSWA

## 2018-09-17 NOTE — Progress Notes (Signed)
Pt refusing morning labs and refusing to go to HD. On call provider B[place]heimer made aware. Will continue to monitor.

## 2018-09-17 NOTE — Consult Note (Signed)
Consultation Note Date: 09/17/2018   Patient Name: [name]  DOB: 1960/07/01  MRN: [id number]  Age / Sex: 58 y.o., female  PCP: Patient, No Pcp Per Referring Physician: Damita Lack, MD  Reason for Consultation: Establishing goals of care and Psychosocial/spiritual support  HPI/Patient Profile: 57 y.o. female  with past medical history of end-stage renal disease on hemodialysis noncompliant, hyper kalemia, diabetes, schizoaffective disorder admitted on 09/15/2018 with volume overload, altered mental status.  Patient resides at [facility] in [place].  She had missed hemodialysis for approximately 1 week and presented with volume overload and altered mental status.  Her potassium upon admission was 7.2.  Consult ordered for goals of care specifically that patient had refused hemodialysis on 09/17/2018.   Clinical Assessment and Goals of Care: Patient seen, chart reviewed.  Patient is alert, oriented to herself and recognizes that she is in the hospital but is otherwise confused.  She is very difficult for me to understand.  She appeared to be attending to internal stimuli.  When I would ask her question she would look in the opposite direction and mumble as if she were in a conversation.  I did ask her if she was hearing voices and she stated "yeah, sometimes".  I asked her if it was a man or a woman and she replied a man.  I asked her if man always spoke to her or if he sometimes went away and she stated "always".  I also asked her if she was on hemodialysis and she replied yes.  I asked her if she understood if she were to stop hemodialysis what would happen and she replied" I would die".  I then asked her if she wished to continue hemodialysis and she again turned to her right and began to whisper to unseen others and never did answer.  I did reach out to the [facility] in [place] at [phone].  I was able to speak to somebody who had knowledge of Nailea.  They stated  her noncompliance with hemodialysis is a regular occurrence.  They reiterated that she has family support but no designated healthcare POA.  They typically interact with her brothers, Sina Sumpter at [phone] or her brother, Jermani Pund, at [phone] Patient has on record at the [facility] both a DNR as well as a MOST form.  Those did come with her to the hospital and I personally reviewed those.  Per MOST form, which is dated later than DNR form, her goals are DNR with limited interventions for example IV fluids, transfer to the hospital if necessary, BiPAP allowed  I have attempted to reach both Cornelia Copa as well as Drucilla Chalet but both individuals numbers do not have a voicemail that was set up, thus no means to leave a message.  Per [facility], patient is unmarried.  Brittley will tell you that she has a daughter named Cassandria Santee which per [facility] is not true.  She did tell me about Tabitha during our  conversation    SUMMARY OF RECOMMENDATIONS   Per paperwork from [facility], pt is a DNR with limited interventions. Will change code status to partial. Pt would accept bipap, IVF., 02 Palliative medicine to continue to try to reach family for further goals of care discussion specifically ongoing  hemodialysis.  Per [facility] spokesperson, family has been supportive of patient continuing dialysis.  Her last dialysis here was on 09/16/2018 Code Status/Advance Care Planning:  DNR    Symptom Management:  Psychosis: Continue Risperdal 3 mg nightly, Prolixin 5 mg daily, Depakote 500 mg nightly, Wellbutrin XL; further adjustments to be made by psychiatry Her confusion is likely exacerbated by developing uremia Palliative Prophylaxis:   Aspiration, Bowel Regimen, Delirium Protocol, Eye Care, Frequent Pain Assessment, Oral Care and Turn Reposition  Additional Recommendations  (Limitations, Scope, Preferences):  Initiate Comfort Feeding, No Artificial Feeding, No Chemotherapy, No Radiation, No Surgical Procedures and No Tracheostomy  Psycho-social/Spiritual:   Desire for further Chaplaincy support:no  Additional Recommendations: Referral to Community Resources   Prognosis:   Difficult to determine as patient has been noncompliant with hemodialysis for a while now.  If she were to stop hemodialysis, her prognosis would be less than 2 weeks and she would qualify for residential hospice.  Discharge Planning: To Be Determined      Primary Diagnoses: Present on Admission: . Altered mental status . ESRD (end stage renal disease) ([place]) . Hyperkalemia . ARF (acute renal failure) ([place]) . Hyponatremia   I have reviewed the medical record, interviewed the patient and family, and examined the patient. The following aspects are pertinent.  Past Medical History:  Diagnosis Date  . Bipolar 1 disorder ([place])   . Diabetes mellitus without complication ([place])   . Hypertension   . Schizophrenia [facility])    Social History   Socioeconomic History  . Marital status: Single    Spouse name: Not on file  . Number of children: Not on file  . Years of education: Not on file  . Highest education level: Not on file  Occupational History  . Not on file  Social Needs  . Financial resource strain: Not on file  . Food insecurity:    Worry: Not on file    Inability: Not on file  . Transportation needs:    Medical: Not on file    Non-medical: Not on file  Tobacco Use  . Smoking status: Former Smoker    Types: Cigarettes  . Smokeless tobacco: Never Used  Substance and Sexual Activity  . Alcohol use: No  . Drug use: No  . Sexual activity: Not on file  Lifestyle  . Physical activity:    Days per week: Not on file    Minutes per session: Not on file  . Stress: Not on file  Relationships  . Social connections:    Talks on phone: Not on file    Gets together: Not  on file    Attends religious service: Not on file    Active member of club or organization: Not on file    Attends meetings of clubs or organizations: Not on file    Relationship status: Not on file  Other Topics Concern  . Not on file  Social History Narrative  . Not on file   Family History  Problem Relation Age of Onset  . Diabetes Mother    Scheduled Meds: . aspirin EC  81 mg Oral Daily  . benztropine  1 mg Oral Daily  . buPROPion  300 mg Oral Daily  . Chlorhexidine Gluconate Cloth  6 each Topical Q0600  . divalproex  500 mg Oral QHS  . docusate sodium  100 mg Oral Daily  . fluPHENAZine  5 mg Oral Daily  . heparin  5,000 Units Subcutaneous Q8H  . insulin aspart  0-9 Units Subcutaneous TID WC  . latanoprost  1 drop Both Eyes QHS  . levothyroxine  25 mcg Oral QAC breakfast  . multivitamin  1 tablet Oral QHS  . risperiDONE  3 mg Oral QHS  . sevelamer carbonate  2,400 mg Oral TID WC  . simvastatin  40 mg Oral Daily  . sodium chloride flush  3 mL Intravenous Q12H   Continuous Infusions: . sodium chloride     PRN Meds:.sodium chloride, acetaminophen **OR** acetaminophen, hydrALAZINE, polyethylene glycol, senna-docusate, sodium chloride flush Medications Prior to Admission:  Prior to Admission medications   Medication Sig Start Date End Date Taking? Authorizing Provider  amLODipine (NORVASC) 10 MG tablet Take 10 mg by mouth daily.   Yes [provider]  aspirin EC 81 MG tablet Take 81 mg by mouth daily.   Yes [provider]  calcium acetate (PHOSLO) 667 MG capsule Take 667 mg by mouth 3 (three) times daily with meals.   Yes [provider]  cinacalcet (SENSIPAR) 30 MG tablet Take 30 mg by mouth every evening.   Yes [provider]  divalproex (DEPAKOTE) 500 MG DR tablet Take 500 mg by mouth at bedtime.    Yes [provider]  insulin glargine (LANTUS) 100 UNIT/ML injection Inject 0.05 mLs (5 Units total) into the skin at bedtime.  Patient taking differently: Inject 10 Units into the skin 2 (two) times a day.  10/24/16  Yes Tat, Shanon Brow, MD  latanoprost (XALATAN) 0.005 % ophthalmic solution Place 1 drop into both eyes at bedtime.    Yes [provider]  Melatonin 5 MG TABS Take 5 mg by mouth at bedtime.   Yes [provider]  Multiple Vitamin (MULTIVITAMIN WITH MINERALS) TABS tablet Take 1 tablet by mouth daily.   Yes [provider]  paliperidone (INVEGA) 3 MG 24 hr tablet Take 3 mg by mouth daily.   Yes [provider]  QUEtiapine (SEROQUEL) 50 MG tablet Take 50 mg by mouth every evening.   Yes [provider]  rosuvastatin (CRESTOR) 5 MG tablet Take 5 mg by mouth daily.   Yes [provider]  sertraline (ZOLOFT) 50 MG tablet Take 100 mg by mouth daily.   Yes [provider]   No Known Allergies Review of Systems  Unable to perform ROS: Mental status change    Physical Exam Vitals signs and nursing note reviewed.  Constitutional:      Appearance: She is ill-appearing.  HENT:     Head: Normocephalic and atraumatic.  Neck:     Musculoskeletal: Normal range of motion.  Cardiovascular:     Rate and Rhythm: Normal rate.  Pulmonary:     Effort: Pulmonary effort is normal.  Musculoskeletal: Normal range of motion.  Skin:    General: Skin is warm and dry.  Neurological:     Comments: Oriented to self He denies is that she is in the hospital but is unable to tell me where She can tell me that she resides at [facility] in [facility]  Psychiatric:     Comments: Question whether patient is attending to internal stimuli Patient is difficult to understand She does have a  safety sitter present     Vital Signs: BP (!) 164/73 (BP Location: Left Arm)   Pulse 64   Temp 98.4 F (36.9 C) (Oral)   Resp 19   Ht 5\' 6"  (1.676 m)   Wt 57.8 kg Comment: pt refused. Weight is from 09/16/2018   SpO2 100%   BMI 20.57 kg/m  Pain Scale: 0-10    Pain Score: 0-No pain   SpO2: SpO2: 100 % O2 Device:SpO2: 100 % O2 Flow Rate: .   IO: Intake/output summary:   Intake/Output Summary (Last 24 hours) at 09/17/2018 1620 Last data filed at 09/17/2018 1154 Gross per 24 hour  Intake 480 ml  Output -  Net 480 ml    LBM: Last BM Date: 09/14/18 Baseline Weight: Weight: 68 kg Most recent weight: Weight: 57.8 kg(pt refused. Weight is from 09/16/2018 )     Palliative Assessment/Data:   Flowsheet Rows     Most Recent Value  Intake Tab  Referral Department  Hospitalist  Unit at Time of Referral  Med/Surg Unit  Palliative Care Primary Diagnosis  Nephrology  Date Notified  09/17/18  Palliative Care Type  New Palliative care  Reason for referral  Clarify Goals of Care  Date of Admission  09/15/18  Date first seen by Palliative Care  09/17/18  # of days Palliative referral response time  0 Day(s)  # of days IP prior to Palliative referral  2  Clinical Assessment  Palliative Performance Scale Score  40%  Pain Max last 24 hours  Not able to report  Pain Min Last 24 hours  Not able to report  Dyspnea Max Last 24 Hours  Not able to report  Dyspnea Min Last 24 hours  Not able to report  Nausea Max Last 24 Hours  Not able to report  Nausea Min Last 24 Hours  Not able to report  Anxiety Max Last 24 Hours  Not able to report  Anxiety Min Last 24 Hours  Not able to report  Other Max Last 24 Hours  Not able to report  Psychosocial & Spiritual Assessment  Palliative Care Outcomes  Patient/Family meeting held?  Yes  Who was at the meeting?  pt  Patient/Family wishes: Interventions discontinued/not started   Mechanical Ventilation, Tube feedings/TPN, Trach, PEG  Palliative Care follow-up planned  Yes, Facility      Time In: 1500 Time Out: 1615 Time Total: 75 min Greater than 50%  of this time was spent counseling and coordinating care related to the above assessment and plan.Staffed with Dr. Reesa Chew. Left message with Dr. Mariea Clonts with  psychiatry  Signed by: Dory Horn, NP   Please contact Palliative Medicine Team phone at [phone] for questions and concerns.  For individual provider: See Shea Evans

## 2018-09-17 NOTE — Progress Notes (Addendum)
Patient's iv came off as patient was moving around in bed. IV team was consulted to have a new IV saline lock replacement but patient refused verbally 3x .

## 2018-09-17 NOTE — Progress Notes (Signed)
Patient ID: [name], female   DOB: Mar 09, 1961, 58 y.o.   MRN: [id number] S: Pt refused to go to HD this morning and was combative with nsg staff last night O:BP (!) 183/78 (BP Location: Left Arm)   Pulse 68   Temp 98 F (36.7 C) (Oral)   Resp 18   Ht 5\' 6"  (1.676 m)   Wt 57.8 kg Comment: pt refused. Weight is from 09/16/2018   SpO2 100%   BMI 20.57 kg/m   Intake/Output Summary (Last 24 hours) at 09/17/2018 1117 Last data filed at 09/17/2018 0900 Gross per 24 hour  Intake 360 ml  Output -  Net 360 ml   Intake/Output: I/O last 3 completed shifts: In: 50 [P.O.:580; Other:50] Out: 3000 [Other:3000]  Intake/Output this shift:  Total I/O In: 120 [P.O.:120] Out: -  Weight change: -10.2 kg Gen: lethargic but arousable CVS: no rub Resp: bilateral rales Abd: +BS, soft Ext: 1+ edema, clotted RUE AVG's, right fem HD TDC in place  Recent Labs  Lab 09/15/18 1607 09/15/18 1844 09/16/18 0537 09/16/18 2326  NA 124* 127*  --  128*  K 7.2* 6.4*  --  4.6  CL 94* 95*  --  92*  CO2 16* 16*  --  23  GLUCOSE 190* 46*  --  126*  BUN 116* 116*  --  61*  CREATININE 12.60* 12.63*  --  8.12*  ALBUMIN  --   --   --  2.5*  CALCIUM 9.5 8.8*  --  8.4*  PHOS 7.1*  --  4.6 6.4*   Liver Function Tests: Recent Labs  Lab 09/16/18 2326  ALBUMIN 2.5*   No results for input(s): LIPASE, AMYLASE in the last 168 hours. No results for input(s): AMMONIA in the last 168 hours. CBC: Recent Labs  Lab 09/15/18 1607  WBC 7.7  NEUTROABS 6.1  HGB 9.5*  HCT 29.5*  MCV 89.7  PLT 269   Cardiac Enzymes: No results for input(s): CKTOTAL, CKMB, CKMBINDEX, TROPONINI in the last 168 hours. CBG: Recent Labs  Lab 09/15/18 1539 09/16/18 1316 09/16/18 1712 09/17/18 0735  GLUCAP 180* 129* 142* 100*    Iron Studies: No results for input(s): IRON, TIBC, TRANSFERRIN, FERRITIN in the last 72 hours. Studies/Results: Ct Head Wo Contrast  Result Date: 09/16/2018 CLINICAL DATA:  Altered mental status.  History of schizophrenia and bipolar disorder EXAM: CT HEAD WITHOUT CONTRAST TECHNIQUE: Contiguous axial images were obtained from the base of the skull through the vertex without intravenous contrast. COMPARISON:  October 04, 2017 FINDINGS: Brain: There is moderate diffuse atrophy. There is no intracranial mass, hemorrhage, extra-axial fluid collection, or midline shift. There is minimal small vessel disease in centra semiovale bilaterally. No evident acute infarct. Vascular: There is no hyperdense vessel. There are foci of calcification in each distal vertebral artery as well as in each carotid siphon region. Skull: The bony calvarium appears intact. Sinuses/Orbits: There is mucosal thickening in several ethmoid air cells. Visualized paranasal sinuses otherwise are clear. Orbits appear symmetric bilaterally. Other: Mastoids are clear. Mastoids on the left are somewhat hypoplastic. IMPRESSION: Moderate atrophy with slight periventricular small vessel disease. No acute infarct. No mass or hemorrhage. There are foci of arterial vascular calcification. There is mucosal thickening in several ethmoid air cells. Electronically Signed   By: Lowella Grip III M.D.   On: 09/16/2018 09:05   . aspirin EC  81 mg Oral Daily  . benztropine  1 mg Oral Daily  . buPROPion  300 mg  Oral Daily  . Chlorhexidine Gluconate Cloth  6 each Topical Q0600  . divalproex  500 mg Oral QHS  . docusate sodium  100 mg Oral Daily  . fluPHENAZine  5 mg Oral Daily  . heparin  5,000 Units Subcutaneous Q8H  . insulin aspart  0-9 Units Subcutaneous TID WC  . latanoprost  1 drop Both Eyes QHS  . levothyroxine  25 mcg Oral QAC breakfast  . multivitamin  1 tablet Oral QHS  . risperiDONE  3 mg Oral QHS  . sevelamer carbonate  2,400 mg Oral TID WC  . simvastatin  40 mg Oral Daily  . sodium chloride flush  3 mL Intravenous Q12H    BMET    Component Value Date/Time   NA 128 (L) 09/16/2018 2326   NA 137 09/17/2012 1205   K 4.6  09/16/2018 2326   K 4.2 09/17/2012 1205   CL 92 (L) 09/16/2018 2326   CL 99 09/17/2012 1205   CO2 23 09/16/2018 2326   CO2 29 09/17/2012 1205   GLUCOSE 126 (H) 09/16/2018 2326   GLUCOSE 145 (H) 09/17/2012 1205   BUN 61 (H) 09/16/2018 2326   BUN 35 (H) 09/17/2012 1205   CREATININE 8.12 (H) 09/16/2018 2326   CREATININE 6.81 (H) 09/17/2012 1205   CALCIUM 8.4 (L) 09/16/2018 2326   CALCIUM 9.3 09/17/2012 1205   GFRNONAA 5 (L) 09/16/2018 2326   GFRNONAA 6 (L) 09/17/2012 1205   GFRAA 6 (L) 09/16/2018 2326   GFRAA 7 (L) 09/17/2012 1205   CBC    Component Value Date/Time   WBC 7.7 09/15/2018 1607   RBC 3.29 (L) 09/15/2018 1607   HGB 9.5 (L) 09/15/2018 1607   HGB 10.1 (L) 09/17/2012 1205   HCT 29.5 (L) 09/15/2018 1607   HCT 29.5 (L) 09/17/2012 1205   PLT 269 09/15/2018 1607   PLT 129 (L) 09/17/2012 1205   MCV 89.7 09/15/2018 1607   MCV 97 09/17/2012 1205   MCH 28.9 09/15/2018 1607   MCHC 32.2 09/15/2018 1607   RDW 15.6 (H) 09/15/2018 1607   RDW 15.5 (H) 09/17/2012 1205   LYMPHSABS 0.9 09/15/2018 1607   LYMPHSABS 0.2 (L) 05/21/2012 1654   MONOABS 0.7 09/15/2018 1607   MONOABS 0.6 05/21/2012 1654   EOSABS 0.1 09/15/2018 1607   EOSABS 0.0 05/21/2012 1654   BASOSABS 0.0 09/15/2018 1607   BASOSABS 0.0 05/21/2012 1654      Assessment/ Plan:   1. AMS presumably due to Uremia from noncompliance with HD 2. Suicidal ideations- pt with h/o bipolar disorder and will need psychiatric evaluation 1. Pt currently refusing to go to HD and will need Psych eval for medication adjustments  2. Consider palliative care consult given her refusal for HD 3. ESRD missed HD for over a week with significant uremia s/p urgent HD early this morning due to hyperkalemia.  Will plan for HD again today if she agrees.  I spoke with her about the consequences of not having further HD. 4. Anemia: follow h/h and contact HD unit for ESA rx 5. CKD-MBD: phos improved with binders 6. Nutrition: renal  diet 7. Hypertension: improved with HD and UF. Elevated today as she refused meds and HD   Donetta Potts, MD [facility] [phone]

## 2018-09-17 NOTE — TOC Initial Note (Signed)
Transition of Care [facility]) - Initial/Assessment Note    Patient Details  Name: [name] MRN: [id number] Date of Birth: [birth year]  Transition of Care [facility] CM/SW Contact:    Gelene Mink, [place] Phone Number: 09/17/2018, 3:29 PM  Clinical Narrative:                  CSW went and met with the patient at bedside. The patient was alert and somewhat oriented. She was able to speak with the CSW but her speech was very slurred. She gave permission for the CSW to call her brother. When asked about her dialysis she slurred her speech. With prompting from the [place] she shook her head no that she had not been going. RN was also in the room along with a sitter. RN stated that she had refused hemodialysis this morning. CSW received verification that she would like to go home. CSW asked if the [facility] was home and she said yes. CSW received permission to contact the patient's brother, Angla Delahunt.   CSW called admissions director at [facility] in [place]. Gerald Stabs reported that the patient has been refusing hemodialysis so they made her comfort care. He stated that they would be able to take her back just keep him updated what the plans would be going forward. CSW stated that there is a pending palliative consult and that they are awaiting to see what palliative is going to recommend.   CSW called Letoya Stallone, renal navigator CSW. CSW updated [place] about the patient missing hemodialysis. Dyckman stated that she would continue to monitor the patient.   CSW called the patient's brother Legrand Como. CSW stated that his sister is in the hospital. CSW shared that his sister has been refusing hemodialysis and that is one of the reasons she ended up in the hospital. Patient's brother is agreeable for his sister to go back to the [facility].   CSW will continue to assist with discharge planning.   Expected Discharge Plan: Skilled Nursing Facility Barriers to Discharge: Continued Medical Work up, Other  (comment)(pt has been refusing hemodialysis)   Patient Goals and CMS Choice Patient states their goals for this hospitalization and ongoing recovery are:: pt wants to return back home CMS Medicare.gov Compare Post Acute Care list provided to:: Other (Comment Required)(Pt will return back to facility she arrived from) Choice offered to / list presented to : NA  Expected Discharge Plan and Services Expected Discharge Plan: [place] In-house Referral: Clinical Social Work Discharge Planning Services: NA Post Acute Care Choice: [place] Living arrangements for the past 2 months: [place]                 DME Arranged: N/A DME Agency: NA       HH Arranged: NA [place] Agency: NA        Prior Living Arrangements/Services Living arrangements for the past 2 months: [place] Lives with:: Facility Resident Patient language and need for interpreter reviewed:: No Do you feel safe going back to the place where you live?: Yes      Need for Family Participation in Patient Care: Yes (Comment) Care giver support system in place?: Yes (comment)   Criminal Activity/Legal Involvement Pertinent to Current Situation/Hospitalization: No - Comment as needed  Activities of Daily Living Home Assistive Devices/Equipment: Gilford Rile (specify type) ADL Screening (condition at time of admission) Patient's cognitive ability adequate to safely complete daily activities?: No Is the patient deaf or have  difficulty hearing?: No Does the patient have difficulty seeing, even when wearing glasses/contacts?: No Does the patient have difficulty concentrating, remembering, or making decisions?: Yes Patient able to express need for assistance with ADLs?: Yes Does the patient have difficulty dressing or bathing?: Yes Independently performs ADLs?: No Does the patient have difficulty walking or climbing stairs?: No Weakness of Legs: Both Weakness of Arms/Hands:  None  Permission Sought/Granted Permission sought to share information with : Case Manager Permission granted to share information with : Yes, Verbal Permission Granted  Share Information with NAME: Aaliyana Fredericks  Permission granted to share info w AGENCY: [place] granted to share info w Relationship: Brother     Emotional Assessment   Attitude/Demeanor/Rapport: Unresponsive Affect (typically observed): Accepting Orientation: : Oriented to Self, Oriented to Place, Oriented to  Time Alcohol / Substance Use: Not Applicable Psych Involvement: No (comment)  Admission diagnosis:  Acute hyperkalemia [E87.5] Encephalopathy [G93.40] ESRD (end stage renal disease) on dialysis ([place]) [N18.6, Z99.2] Patient Active Problem List   Diagnosis Date Noted  . ARF (acute renal failure) ([place]) 09/15/2018  . Hyponatremia 09/15/2018  . 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 Schizophrenia ([place]) 10/04/2017  . Hyperkalemia 10/04/2017  . Anemia in ESRD (end-stage renal disease) ([place]) 10/04/2017  . Dysphasia   . Altered mental status 10/22/2016  . ESRD (end stage renal disease) ([place]) 10/22/2016  . Bipolar 1 disorder ([place]) 10/22/2016  . Diabetes mellitus ([place]) 10/22/2016  . Essential hypertension 10/22/2016  . Acute encephalopathy 10/22/2016   PCP:  Patient, No Pcp Per Pharmacy:  No Pharmacies Listed    Social Determinants of Health (SDOH) Interventions    Readmission Risk Interventions Readmission Risk Prevention Plan 09/16/2018  Transportation Screening Complete  PCP or Specialist Appt within 3-5 Days Not Complete  HRI or [place] Not Complete  HRI or Home Care Consult comments see above  Social Work Consult for [place] Planning/Counseling Complete  Medication Review Press photographer) Complete  Some recent data might be hidden

## 2018-09-18 DIAGNOSIS — F209 Schizophrenia, unspecified: Secondary | ICD-10-CM

## 2018-09-18 LAB — GLUCOSE, CAPILLARY
Glucose-Capillary: 216 mg/dL — ABNORMAL HIGH (ref 70–99)
Glucose-Capillary: 187 mg/dL — ABNORMAL HIGH (ref 70–99)
Glucose-Capillary: 128 mg/dL — ABNORMAL HIGH (ref 70–99)

## 2018-09-18 LAB — BASIC METABOLIC PANEL
Anion gap: 16 — ABNORMAL HIGH (ref 5–15)
BUN: 75 mg/dL — ABNORMAL HIGH (ref 6–20)
CO2: 20 mmol/L — ABNORMAL LOW (ref 22–32)
Calcium: 8.7 mg/dL — ABNORMAL LOW (ref 8.9–10.3)
Chloride: 90 mmol/L — ABNORMAL LOW (ref 98–111)
Creatinine, Ser: 9.75 mg/dL — ABNORMAL HIGH (ref 0.44–1.00)
GFR calc Af Amer: 5 mL/min — ABNORMAL LOW (ref >60)
GFR calc non Af Amer: 4 mL/min — ABNORMAL LOW (ref >60)
Glucose, Bld: 194 mg/dL — ABNORMAL HIGH (ref 70–99)
Potassium: 4.4 mmol/L (ref 3.5–5.1)
Sodium: 126 mmol/L — ABNORMAL LOW (ref 135–145)

## 2018-09-18 LAB — CBC
HCT: 25.8 % — ABNORMAL LOW (ref 36.0–46.0)
Hemoglobin: 8.6 g/dL — ABNORMAL LOW (ref 12.0–15.0)
MCH: 28.9 pg (ref 26.0–34.0)
MCHC: 33.3 g/dL (ref 30.0–36.0)
MCV: 86.6 fL (ref 80.0–100.0)
Platelets: 265 10*3/uL (ref 150–400)
RBC: 2.98 MIL/uL — ABNORMAL LOW (ref 3.87–5.11)
RDW: 15.1 % (ref 11.5–15.5)
WBC: 6.6 10*3/uL (ref 4.0–10.5)
nRBC: 0 % (ref 0.0–0.2)

## 2018-09-18 LAB — MAGNESIUM: Magnesium: 2.1 mg/dL (ref 1.7–2.4)

## 2018-09-18 NOTE — Progress Notes (Signed)
Patient ID: [name], female   DOB: [birth year], 58 y.o.   MRN: [id number] S: Pt continues to refuse to go to dialysis. O:BP (!) 188/70 (BP Location: Left Arm)   Pulse 69   Temp 98.6 F (37 C) (Oral)   Resp (!) 21   Ht 5\' 6"  (1.676 m)   Wt 60.5 kg   SpO2 99%   BMI 21.53 kg/m   Intake/Output Summary (Last 24 hours) at 09/18/2018 1132 Last data filed at 09/17/2018 2200 Gross per 24 hour  Intake 720 ml  Output -  Net 720 ml   Intake/Output: I/O last 3 completed shifts: In: 840 [P.O.:840] Out: -   Intake/Output this shift:  No intake/output data recorded. Weight change: 2.7 kg Gen: awake alert but mumbles responses and wont make eye contact CVS: no rub Resp: bibasilar crackles Abd: benign Ext: 1+ edema, right femoral TDC  Recent Labs  Lab 09/15/18 1607 09/15/18 1844 09/16/18 0537 09/16/18 2326 09/18/18 0506  NA 124* 127*  --  128* 126*  K 7.2* 6.4*  --  4.6 4.4  CL 94* 95*  --  92* 90*  CO2 16* 16*  --  23 20*  GLUCOSE 190* 46*  --  126* 194*  BUN 116* 116*  --  61* 75*  CREATININE 12.60* 12.63*  --  8.12* 9.75*  ALBUMIN  --   --   --  2.5*  --   CALCIUM 9.5 8.8*  --  8.4* 8.7*  PHOS 7.1*  --  4.6 6.4*  --    Liver Function Tests: Recent Labs  Lab 09/16/18 2326  ALBUMIN 2.5*   No results for input(s): LIPASE, AMYLASE in the last 168 hours. No results for input(s): AMMONIA in the last 168 hours. CBC: Recent Labs  Lab 09/15/18 1607 09/18/18 0506  WBC 7.7 6.6  NEUTROABS 6.1  --   HGB 9.5* 8.6*  HCT 29.5* 25.8*  MCV 89.7 86.6  PLT 269 265   Cardiac Enzymes: No results for input(s): CKTOTAL, CKMB, CKMBINDEX, TROPONINI in the last 168 hours. CBG: Recent Labs  Lab 09/16/18 1712 09/17/18 0735 09/17/18 1126 09/17/18 2055 09/18/18 1117  GLUCAP 142* 100* 181* 178* 216*    Iron Studies: No results for input(s): IRON, TIBC, TRANSFERRIN, FERRITIN in the last 72 hours. Studies/Results: No results found. Marland Kitchen aspirin EC  81 mg Oral Daily  . benztropine   1 mg Oral Daily  . buPROPion  300 mg Oral Daily  . Chlorhexidine Gluconate Cloth  6 each Topical Q0600  . divalproex  500 mg Oral QHS  . docusate sodium  100 mg Oral Daily  . fluPHENAZine  5 mg Oral Daily  . heparin  5,000 Units Subcutaneous Q8H  . insulin aspart  0-9 Units Subcutaneous TID WC  . latanoprost  1 drop Both Eyes QHS  . levothyroxine  25 mcg Oral QAC breakfast  . multivitamin  1 tablet Oral QHS  . risperiDONE  3 mg Oral QHS  . sevelamer carbonate  2,400 mg Oral TID WC  . simvastatin  40 mg Oral Daily  . sodium chloride flush  3 mL Intravenous Q12H    BMET    Component Value Date/Time   NA 126 (L) 09/18/2018 0506   NA 137 09/17/2012 1205   K 4.4 09/18/2018 0506   K 4.2 09/17/2012 1205   CL 90 (L) 09/18/2018 0506   CL 99 09/17/2012 1205   CO2 20 (L) 09/18/2018 0506   CO2 29 09/17/2012 1205  GLUCOSE 194 (H) 09/18/2018 0506   GLUCOSE 145 (H) 09/17/2012 1205   BUN 75 (H) 09/18/2018 0506   BUN 35 (H) 09/17/2012 1205   CREATININE 9.75 (H) 09/18/2018 0506   CREATININE 6.81 (H) 09/17/2012 1205   CALCIUM 8.7 (L) 09/18/2018 0506   CALCIUM 9.3 09/17/2012 1205   GFRNONAA 4 (L) 09/18/2018 0506   GFRNONAA 6 (L) 09/17/2012 1205   GFRAA 5 (L) 09/18/2018 0506   GFRAA 7 (L) 09/17/2012 1205   CBC    Component Value Date/Time   WBC 6.6 09/18/2018 0506   RBC 2.98 (L) 09/18/2018 0506   HGB 8.6 (L) 09/18/2018 0506   HGB 10.1 (L) 09/17/2012 1205   HCT 25.8 (L) 09/18/2018 0506   HCT 29.5 (L) 09/17/2012 1205   PLT 265 09/18/2018 0506   PLT 129 (L) 09/17/2012 1205   MCV 86.6 09/18/2018 0506   MCV 97 09/17/2012 1205   MCH 28.9 09/18/2018 0506   MCHC 33.3 09/18/2018 0506   RDW 15.1 09/18/2018 0506   RDW 15.5 (H) 09/17/2012 1205   LYMPHSABS 0.9 09/15/2018 1607   LYMPHSABS 0.2 (L) 05/21/2012 1654   MONOABS 0.7 09/15/2018 1607   MONOABS 0.6 05/21/2012 1654   EOSABS 0.1 09/15/2018 1607   EOSABS 0.0 05/21/2012 1654   BASOSABS 0.0 09/15/2018 1607   BASOSABS 0.0 05/21/2012  1654       Assessment/ Plan:  1. AMS presumably due to Uremia from noncompliance with HD 2. Suicidal ideations- pt with h/o bipolar disorder and will need psychiatric evaluation 1. Pt continues to refuse HD. 2. Seen by Psych 09/17/18, however no mention of this "harmful behavior" in consult.  Please reconsult psych to address this issue which apparently was missed on evaluation yesterday as they wrote "no imminent risk to self" 3. Appreciate palliative care consult given her refusal for HD 3. ESRDmissed HD for over a week with significant uremia s/p urgent HD early this morning due to hyperkalemia. Will plan for HD again today if she agrees.  I spoke with her about the consequences of not having further HD. 4. Anemia:follow h/h and contact HD unit for ESA rx 5. CKD-MBD:phos improved with binders 6. Nutrition:renal diet 7. Hypertension:improved with HD and UF. Elevated today as she refused meds and HD   Donetta Potts, MD [facility] [phone]

## 2018-09-18 NOTE — Progress Notes (Signed)
  Palliative medicine progress note  Patient seen, chart reviewed.  As per initial consult dated 09/17/2018, last hemodialysis was 09/16/2018.  [name] seems a little more alert today, speech is a little clearer to understand.  We talked again about dialysis.  She again indicates that she understands that people that do not take dialysis who are in kidney failure will die.  She acknowledges that she is tired and she states dialysis "hurts".  When I asked her if she wanting to stop dialysis because she is gotten tired and the pain she does not answer me.  Her creatinine today is 9.75, compared to 8.12 on 01/17/2019.  I reached 1 of her brothers by phone, [name] at [phone].  [name] verifies the conversation that I had with the [facility] that this is been an ongoing problem over the past year of patient being noncompliant with hemodialysis, volume overload, hospitalization.  He states that Jolene has been on dialysis for 10 to 12 years and early on was able to be compliant and tolerate this.  He and his siblings struggle with making decisions to stop dialysis as long as Lynette is alert and awake.  She is a DNR with limited interventions per most form which is in her hardcopy chart.  Her brother [name] recognizes that this is ongoing noncompliance is eventually going to lead to her death.   Patient Profile: 58 y.o. female  with past medical history of end-stage renal disease on hemodialysis noncompliant, hyper kalemia, diabetes, schizoaffective disorder admitted on 09/15/2018 with volume overload, altered mental status.  Patient resides at [facility] in [place].  She had missed hemodialysis for approximately 1 week and presented with volume overload and altered mental status.  Her potassium upon admission was 7.2.  Consult ordered for goals of care specifically that patient had refused hemodialysis on 5/29  Plan CODE STATUS changed in epic to DNR with limited medical  interventions per MOST form Those limited interventions could include medical treatment, IV fluids, cardiac monitoring is indicated.  Do not use intubation or mechanical ventilation.  May consider use of less invasive airway support such as BiPAP or CPAP.  Provide comfort measures.  Transfer to hospital if indicated.  Avoid intensive care. Continue to encourage patient to go to dialysis.  It would be difficult to force her to go even though she likely lacks capacity.  I sense, if she were to become less responsive, less engaged, we would be able to dialyze her again before being discharged back to [facility] where unfortunately this cycle will start all over again.  Thank you, Romona Curls, NP Total time: 25 minutes Greater than 50% of visit was spent in counseling and coordination of care

## 2018-09-18 NOTE — Progress Notes (Signed)
PROGRESS NOTE    NADEEN SHIPMAN  BHA:[id number] DOB: [birth year] DOA: 09/15/2018 PCP: Patient, No Pcp Per   Brief Narrative:  58 year old with history of ESRD on hemodialysis, diabetes mellitus type 2, essential hypertension, schizophrenia/bipolar disorder came to the hospital after missing her dialysis sessions for more than 1 week.  Overall she is a poor historian.  She was found to be volume overloaded with significant hyperkalemia of 7.2, sodium of 124, bicarb 16.  KG changes with slight peaked T waves, initially treated with calcium gluconate, sodium bicarb, Lokelma, Kayexalate.  Patient transferred to [facility] for urgent dialysis and nephrology consultation.  CT of the head was ordered due to somnolence.  Elevated 116.  Unfortunately patient continues to refuse HD in the hospital.  Palliative care team, nephrology and psychiatry has seen the patient.  Patient has been cleared by psychiatry.   Assessment & Plan:   Principal Problem:   Schizophrenia ([place]) Active Problems:   Altered mental status   ESRD (end stage renal disease) on dialysis (HCC)   Diabetes mellitus ([place])   Hyperkalemia   ARF (acute renal failure) (HCC)   Hyponatremia   Goals of care, counseling/discussion   Palliative care by specialist  Hyperkalemia due to dialysis noncompliance ESRD on hemodialysis Monday Wednesday Friday - Unfortunately patient still continues to refuse hemodialysis.  Patient understands that not receiving hemodialysis will lead to death and she is able to repeat this back to me.  Patient is also been seen by psychiatry who states at this point continuing her home medication otherwise there is no need for inpatient psychiatry or further adjustments in her treatment. -Palliative care team following.  Patient is partial code- only NIPPV if necessary otherwise no intubation or CPR.  Patient has most form in the chart.  Acute metabolic encephalopathy, likely uremic - CT head shows moderate  atrophy but no acute intracranial pathology.  Also component of psych  Hyponatremia -Secondary to being volume overload  Diabetes mellitus type 2, insulin-dependent -Accu-Cheks and sliding scale.  Holding home Lantus until her oral intake improves  Hyperlipidemia -Continue statin  History of schizophrenia bipolar disorder - Hold Risperdal, Prolixin, Cogentin and Depakote.  Awaiting psychiatry input.  Hypothyroidism -Continue Synthroid  DVT prophylaxis: Subcu heparin Code Status: Full code Family Communication: None at bedside Disposition Plan: If patient continues to refuse dialysis, I would opt for discharging patient back to [facility].  Unfortunately she would remain high risk for readmissions.  Consultants:   Nephrology  Palliative care team  Psychiatry  Procedures:   None  Antimicrobials:   None   Subjective: Patient seen and examined at bedside, she is sitting at the side of the bed not in any acute distress.  She is able to tell me that she will die if she does not receive dialysis.  Review of Systems Otherwise negative except as per HPI, including: General = no fevers, chills, dizziness, malaise, fatigue HEENT/EYES = negative for pain, redness, loss of vision, double vision, blurred vision, loss of hearing, sore throat, hoarseness, dysphagia Cardiovascular= negative for chest pain, palpitation, murmurs, lower extremity swelling Respiratory/lungs= negative for shortness of breath, cough, hemoptysis, wheezing, mucus production Gastrointestinal= negative for nausea, vomiting,, abdominal pain, melena, hematemesis Genitourinary= negative for Dysuria, Hematuria, Change in Urinary Frequency MSK = Negative for arthralgia, myalgias, Back Pain, Joint swelling  Neurology= Negative for headache, seizures, numbness, tingling  Psychiatry= Negative for anxiety, depression, suicidal and homocidal ideation Allergy/Immunology= Medication/Food allergy as listed  Skin=  Negative for Rash,  lesions, ulcers, itching   Objective: Vitals:   09/17/18 1613 09/17/18 2057 09/18/18 0406 09/18/18 0617  BP: (!) 164/73 (!) 189/85  (!) 188/70  Pulse:  67  69  Resp:  17  (!) 21  Temp:  98.1 F (36.7 C)  98.6 F (37 C)  TempSrc:  Oral  Oral  SpO2:  100%  99%  Weight:   60.5 kg   Height:        Intake/Output Summary (Last 24 hours) at 09/18/2018 1424 Last data filed at 09/17/2018 2200 Gross per 24 hour  Intake 480 ml  Output --  Net 480 ml   Filed Weights   09/16/18 0458 09/17/18 0500 09/18/18 0406  Weight: 57.8 kg 57.8 kg 60.5 kg    Examination:  Constitutional: NAD, calm, comfortable Eyes: PERRL, lids and conjunctivae normal ENMT: Mucous membranes are moist. Posterior pharynx clear of any exudate or lesions.Normal dentition.  Neck: normal, supple, no masses, no thyromegaly Respiratory: Somewhat bibasilar crackles Cardiovascular: Regular rate and rhythm, no murmurs / rubs / gallops.  1+ bilateral lower extremity pitting edema. 2+ pedal pulses. No carotid bruits.  Abdomen: no tenderness, no masses palpated. No hepatosplenomegaly. Bowel sounds positive.  Musculoskeletal: no clubbing / cyanosis. No joint deformity upper and lower extremities. Good ROM, no contractures. Normal muscle tone.  Skin: no rashes, lesions, ulcers. No induration Neurologic: CN 2-12 grossly intact. Sensation intact, DTR normal. Strength 5/5 in all 4.  Psychiatric: Overall has poor judgment Right femoral dialysis catheter  Data Reviewed:   CBC: Recent Labs  Lab 09/15/18 1607 09/18/18 0506  WBC 7.7 6.6  NEUTROABS 6.1  --   HGB 9.5* 8.6*  HCT 29.5* 25.8*  MCV 89.7 86.6  PLT 269 818   Basic Metabolic Panel: Recent Labs  Lab 09/15/18 1607 09/15/18 1844 09/16/18 0537 09/16/18 2326 09/18/18 0506  NA 124* 127*  --  128* 126*  K 7.2* 6.4*  --  4.6 4.4  CL 94* 95*  --  92* 90*  CO2 16* 16*  --  23 20*  GLUCOSE 190* 46*  --  126* 194*  BUN 116* 116*  --  61* 75*   CREATININE 12.60* 12.63*  --  8.12* 9.75*  CALCIUM 9.5 8.8*  --  8.4* 8.7*  MG 2.6*  --   --   --  2.1  PHOS 7.1*  --  4.6 6.4*  --    GFR: Estimated Creatinine Clearance: 6 mL/min (A) (by C-G formula based on SCr of 9.75 mg/dL (H)). Liver Function Tests: Recent Labs  Lab 09/16/18 2326  ALBUMIN 2.5*   No results for input(s): LIPASE, AMYLASE in the last 168 hours. No results for input(s): AMMONIA in the last 168 hours. Coagulation Profile: No results for input(s): INR, PROTIME in the last 168 hours. Cardiac Enzymes: No results for input(s): CKTOTAL, CKMB, CKMBINDEX, TROPONINI in the last 168 hours. BNP (last 3 results) No results for input(s): PROBNP in the last 8760 hours. HbA1C: No results for input(s): HGBA1C in the last 72 hours. CBG: Recent Labs  Lab 09/16/18 1712 09/17/18 0735 09/17/18 1126 09/17/18 2055 09/18/18 1117  GLUCAP 142* 100* 181* 178* 216*   Lipid Profile: No results for input(s): CHOL, HDL, LDLCALC, TRIG, CHOLHDL, LDLDIRECT in the last 72 hours. Thyroid Function Tests: No results for input(s): TSH, T4TOTAL, FREET4, T3FREE, THYROIDAB in the last 72 hours. Anemia Panel: No results for input(s): VITAMINB12, FOLATE, FERRITIN, TIBC, IRON, RETICCTPCT in the last 72 hours. Sepsis Labs: No results for  input(s): PROCALCITON, LATICACIDVEN in the last 168 hours.  Recent Results (from the past 240 hour(s))  SARS Coronavirus 2 (CEPHEID - Performed in [place] hospital lab), Hosp Order     Status: None   Collection Time: 09/15/18  3:49 PM  Result Value Ref Range Status   SARS Coronavirus 2 NEGATIVE NEGATIVE Final    Comment: (NOTE) If result is NEGATIVE SARS-CoV-2 target nucleic acids are NOT DETECTED. The SARS-CoV-2 RNA is generally detectable in upper and lower  respiratory specimens during the acute phase of infection. The lowest  concentration of SARS-CoV-2 viral copies this assay can detect is 250  copies / mL. A negative result does not preclude  SARS-CoV-2 infection  and should not be used as the sole basis for treatment or other  patient management decisions.  A negative result may occur with  improper specimen collection / handling, submission of specimen other  than nasopharyngeal swab, presence of viral mutation(s) within the  areas targeted by this assay, and inadequate number of viral copies  (<250 copies / mL). A negative result must be combined with clinical  observations, patient history, and epidemiological information. If result is POSITIVE SARS-CoV-2 target nucleic acids are DETECTED. The SARS-CoV-2 RNA is generally detectable in upper and lower  respiratory specimens dur ing the acute phase of infection.  Positive  results are indicative of active infection with SARS-CoV-2.  Clinical  correlation with patient history and other diagnostic information is  necessary to determine patient infection status.  Positive results do  not rule out bacterial infection or co-infection with other viruses. If result is PRESUMPTIVE POSTIVE SARS-CoV-2 nucleic acids MAY BE PRESENT.   A presumptive positive result was obtained on the submitted specimen  and confirmed on repeat testing.  While 2019 novel coronavirus  (SARS-CoV-2) nucleic acids may be present in the submitted sample  additional confirmatory testing may be necessary for epidemiological  and / or clinical management purposes  to differentiate between  SARS-CoV-2 and other Sarbecovirus currently known to infect humans.  If clinically indicated additional testing with an alternate test  methodology [phone]) is advised. The SARS-CoV-2 RNA is generally  detectable in upper and lower respiratory sp ecimens during the acute  phase of infection. The expected result is Negative. Fact Sheet for Patients:  StrictlyIdeas.no Fact Sheet for Healthcare Providers: BankingDealers.co.za This test is not yet approved or cleared by the  [place] FDA and has been authorized for detection and/or diagnosis of SARS-CoV-2 by FDA under an Emergency Use Authorization (EUA).  This EUA will remain in effect (meaning this test can be used) for the duration of the COVID-19 declaration under Section 564(b)(1) of the Act, 21 U.S.C. section 360bbb-3(b)(1), unless the authorization is terminated or revoked sooner. Performed at [facility], [address] [place], [place] [address]   MRSA PCR Screening     Status: None   Collection Time: 09/16/18 10:01 AM  Result Value Ref Range Status   MRSA by PCR NEGATIVE NEGATIVE Final    Comment:        The GeneXpert MRSA Assay (FDA approved for NASAL specimens only), is one component of a comprehensive MRSA colonization surveillance program. It is not intended to diagnose MRSA infection nor to guide or monitor treatment for MRSA infections. Performed at [facility], [place] [address], [place], [place] [address]          Radiology Studies: No results found.      Scheduled Meds:  aspirin EC  81  mg Oral Daily   benztropine  1 mg Oral Daily   buPROPion  300 mg Oral Daily   Chlorhexidine Gluconate Cloth  6 each Topical Q0600   divalproex  500 mg Oral QHS   docusate sodium  100 mg Oral Daily   fluPHENAZine  5 mg Oral Daily   heparin  5,000 Units Subcutaneous Q8H   insulin aspart  0-9 Units Subcutaneous TID WC   latanoprost  1 drop Both Eyes QHS   levothyroxine  25 mcg Oral QAC breakfast   multivitamin  1 tablet Oral QHS   risperiDONE  3 mg Oral QHS   sevelamer carbonate  2,400 mg Oral TID WC   simvastatin  40 mg Oral Daily   sodium chloride flush  3 mL Intravenous Q12H   Continuous Infusions:  sodium chloride       LOS: 3 days   Time spent= 30 mins    Kenechukwu Eckstein Arsenio Loader, MD [facility]  If 7PM-7AM, please contact night-coverage [email or web address] 09/18/2018, 2:24 PM

## 2018-09-19 DIAGNOSIS — Z9119 Patient's noncompliance with other medical treatment and regimen: Secondary | ICD-10-CM

## 2018-09-19 LAB — GLUCOSE, CAPILLARY
Glucose-Capillary: 119 mg/dL — ABNORMAL HIGH (ref 70–99)
Glucose-Capillary: 175 mg/dL — ABNORMAL HIGH (ref 70–99)
Glucose-Capillary: 135 mg/dL — ABNORMAL HIGH (ref 70–99)
Glucose-Capillary: 174 mg/dL — ABNORMAL HIGH (ref 70–99)

## 2018-09-19 NOTE — Progress Notes (Signed)
Pt refused to have CBG checked this am. [place], "no."  Eleanora Neighbor, RN

## 2018-09-19 NOTE — Progress Notes (Signed)
Patient ID: [name], female   DOB: 1960/10/17, 58 y.o.   MRN: [id number] S: continues to refuse dialysis O:BP (!) 165/72 (BP Location: Left Arm)   Pulse 66   Temp 98.4 F (36.9 C) (Oral)   Resp (!) 22   Ht 5\' 6"  (1.676 m)   Wt 60.5 kg   SpO2 100%   BMI 21.53 kg/m   Intake/Output Summary (Last 24 hours) at 09/19/2018 1251 Last data filed at 09/19/2018 0837 Gross per 24 hour  Intake 899 ml  Output 0 ml  Net 899 ml   Intake/Output: I/O last 3 completed shifts: In: 89 [P.O.:1019] Out: 0   Intake/Output this shift:  Total I/O In: 240 [P.O.:240] Out: -  Weight change: 0 kg Gen: NAD CVS: no rub Resp: cta Abd: +BS, soft, NT Ext: 1+ edema, right fem tdc, clotted RUE HeroAVG  Recent Labs  Lab 09/15/18 1607 09/15/18 1844 09/16/18 0537 09/16/18 2326 09/18/18 0506  NA 124* 127*  --  128* 126*  K 7.2* 6.4*  --  4.6 4.4  CL 94* 95*  --  92* 90*  CO2 16* 16*  --  23 20*  GLUCOSE 190* 46*  --  126* 194*  BUN 116* 116*  --  61* 75*  CREATININE 12.60* 12.63*  --  8.12* 9.75*  ALBUMIN  --   --   --  2.5*  --   CALCIUM 9.5 8.8*  --  8.4* 8.7*  PHOS 7.1*  --  4.6 6.4*  --    Liver Function Tests: Recent Labs  Lab 09/16/18 2326  ALBUMIN 2.5*   No results for input(s): LIPASE, AMYLASE in the last 168 hours. No results for input(s): AMMONIA in the last 168 hours. CBC: Recent Labs  Lab 09/15/18 1607 09/18/18 0506  WBC 7.7 6.6  NEUTROABS 6.1  --   HGB 9.5* 8.6*  HCT 29.5* 25.8*  MCV 89.7 86.6  PLT 269 265   Cardiac Enzymes: No results for input(s): CKTOTAL, CKMB, CKMBINDEX, TROPONINI in the last 168 hours. CBG: Recent Labs  Lab 09/18/18 1117 09/18/18 1610 09/18/18 2129 09/19/18 0723 09/19/18 1128  GLUCAP 216* 187* 128* 119* 175*    Iron Studies: No results for input(s): IRON, TIBC, TRANSFERRIN, FERRITIN in the last 72 hours. Studies/Results: No results found. Marland Kitchen aspirin EC  81 mg Oral Daily  . benztropine  1 mg Oral Daily  . buPROPion  300 mg Oral  Daily  . Chlorhexidine Gluconate Cloth  6 each Topical Q0600  . divalproex  500 mg Oral QHS  . docusate sodium  100 mg Oral Daily  . fluPHENAZine  5 mg Oral Daily  . heparin  5,000 Units Subcutaneous Q8H  . insulin aspart  0-9 Units Subcutaneous TID WC  . latanoprost  1 drop Both Eyes QHS  . levothyroxine  25 mcg Oral QAC breakfast  . multivitamin  1 tablet Oral QHS  . risperiDONE  3 mg Oral QHS  . sevelamer carbonate  2,400 mg Oral TID WC  . simvastatin  40 mg Oral Daily  . sodium chloride flush  3 mL Intravenous Q12H    BMET    Component Value Date/Time   NA 126 (L) 09/18/2018 0506   NA 137 09/17/2012 1205   K 4.4 09/18/2018 0506   K 4.2 09/17/2012 1205   CL 90 (L) 09/18/2018 0506   CL 99 09/17/2012 1205   CO2 20 (L) 09/18/2018 0506   CO2 29 09/17/2012 1205   GLUCOSE 194 (  H) 09/18/2018 0506   GLUCOSE 145 (H) 09/17/2012 1205   BUN 75 (H) 09/18/2018 0506   BUN 35 (H) 09/17/2012 1205   CREATININE 9.75 (H) 09/18/2018 0506   CREATININE 6.81 (H) 09/17/2012 1205   CALCIUM 8.7 (L) 09/18/2018 0506   CALCIUM 9.3 09/17/2012 1205   GFRNONAA 4 (L) 09/18/2018 0506   GFRNONAA 6 (L) 09/17/2012 1205   GFRAA 5 (L) 09/18/2018 0506   GFRAA 7 (L) 09/17/2012 1205   CBC    Component Value Date/Time   WBC 6.6 09/18/2018 0506   RBC 2.98 (L) 09/18/2018 0506   HGB 8.6 (L) 09/18/2018 0506   HGB 10.1 (L) 09/17/2012 1205   HCT 25.8 (L) 09/18/2018 0506   HCT 29.5 (L) 09/17/2012 1205   PLT 265 09/18/2018 0506   PLT 129 (L) 09/17/2012 1205   MCV 86.6 09/18/2018 0506   MCV 97 09/17/2012 1205   MCH 28.9 09/18/2018 0506   MCHC 33.3 09/18/2018 0506   RDW 15.1 09/18/2018 0506   RDW 15.5 (H) 09/17/2012 1205   LYMPHSABS 0.9 09/15/2018 1607   LYMPHSABS 0.2 (L) 05/21/2012 1654   MONOABS 0.7 09/15/2018 1607   MONOABS 0.6 05/21/2012 1654   EOSABS 0.1 09/15/2018 1607   EOSABS 0.0 05/21/2012 1654   BASOSABS 0.0 09/15/2018 1607   BASOSABS 0.0 05/21/2012 1654    Assessment/ Plan:  1. AMS  presumably due to Uremia from noncompliance with HD 2. Suicidal ideations- pt with h/o bipolar disorder and will need psychiatric evaluation 1. Pt continues to refuse HD. 2. Seen by Psych 09/17/18, however no mention of this "harmful behavior" in consult.  Please reconsult psych to address this issue which apparently was missed on evaluation 09/17/18 as they wrote "no imminent risk to self" 3. Appreciate palliative care consult given her refusal for HD 3. ESRDmissed HD for over a week with significant uremia s/p urgent HD early this morning due to hyperkalemia. Will plan for HD again today if she agrees.I spoke with her about the consequences of not having further HD. 4. Anemia:follow h/h and contact HD unit for ESA rx 5. CKD-MBD:phos improved with binders 6. Nutrition:renal diet 7. Hypertension:improved with HD and UF.Elevated today as she refused meds and HD 8. Disposition- unclear if she needs hospice or if this is just related to her psychiatric illness.  Awaiting re-evaluation by Psych.   Donetta Potts, MD [employer] [phone]

## 2018-09-19 NOTE — Progress Notes (Signed)
PROGRESS NOTE    [name]  ZGY:[id number] DOB: 12-23-1960 DOA: 09/15/2018 PCP: Patient, No Pcp Per   Brief Narrative:  58 year old with history of ESRD on hemodialysis, diabetes mellitus type 2, essential hypertension, schizophrenia/bipolar disorder came to the hospital after missing her dialysis sessions for more than 1 week.  Overall she is a poor historian.  She was found to be volume overloaded with significant hyperkalemia of 7.2, sodium of 124, bicarb 16.  KG changes with slight peaked T waves, initially treated with calcium gluconate, sodium bicarb, Lokelma, Kayexalate.  Patient transferred to [facility] for urgent dialysis and nephrology consultation.  CT of the head was ordered due to somnolence.  Elevated 116.  Unfortunately patient continues to refuse HD in the hospital.  Palliative care team, nephrology and psychiatry has seen the patient.  Patient has been cleared by psychiatry.   Assessment & Plan:   Principal Problem:   Schizophrenia ([place]) Active Problems:   Altered mental status   ESRD (end stage renal disease) on dialysis (HCC)   Diabetes mellitus ([place])   Hyperkalemia   ARF (acute renal failure) (HCC)   Hyponatremia   Goals of care, counseling/discussion   Palliative care by specialist  Hyperkalemia due to dialysis noncompliance ESRD on hemodialysis Monday Wednesday Friday - Continues to refuse dialysis..  Patient understands that not receiving hemodialysis will lead to death and she is able to repeat this back to me.  Patient is also refusing to take her psychiatry meds.  Read below for details. -Nephrology team following. -Palliative care team following.  Patient is partial code- only NIPPV if necessary otherwise no intubation or CPR.  Patient has most form in the chart.  Acute metabolic encephalopathy, likely uremic - CT head shows moderate atrophy but no acute intracranial pathology.  Also component of psych  Hyponatremia -Secondary to being volume  overload  Diabetes mellitus type 2, insulin-dependent -Accu-Cheks and sliding scale.  Holding home Lantus until her oral intake improves  Hyperlipidemia -Continue statin  History of schizophrenia bipolar disorder - Resume Risperdal, Prolixin, Cogentin and Depakote.  Appreciate input from Dr. Vevelyn Royals inpatient psych required.  On 5/31 also spoke with Dr Tiburcio Pea over the phone- advised me that they are unable to offer any further help as patient is currently not combative, acutely suicidal or homicidal they cannot force medications on the patient that she does not want to take.  He has informed me that it is against the state slow to for psychiatry medications on someone who is otherwise calmly saying no to any treatment including medications.  She would benefit from long-term power of attorney.  Palliative care team following to help establish goals of care  Hypothyroidism -Continue Synthroid  DVT prophylaxis: Subcu heparin Code Status: Full code Family Communication: None at bedside Disposition Plan: Unfortunately patient continues to refuse dialysis leading to poor prognosis and death.  Patient is aware of this.  Unable to force any medications on her if she does not want it.  If continues to refuse this, will have to discharge her over to [facility].  Consultants:   Nephrology  Palliative care team  Psychiatry  Procedures:   None  Antimicrobials:   None   Subjective: Patient seen and examined at bedside.  Refusing her medications and dialysis this morning.  Review of Systems Otherwise negative except as per HPI, including: General = no fevers, chills, dizziness, malaise, fatigue HEENT/EYES = negative for pain, redness, loss of vision, double vision, blurred vision, loss of  hearing, sore throat, hoarseness, dysphagia Cardiovascular= negative for chest pain, palpitation, murmurs, lower extremity swelling Respiratory/lungs= negative for shortness of breath, cough,  hemoptysis, wheezing, mucus production Gastrointestinal= negative for nausea, vomiting,, abdominal pain, melena, hematemesis Genitourinary= negative for Dysuria, Hematuria, Change in Urinary Frequency MSK = Negative for arthralgia, myalgias, Back Pain, Joint swelling  Neurology= Negative for headache, seizures, numbness, tingling  Psychiatry= Negative for anxiety, depression, suicidal and homocidal ideation Allergy/Immunology= Medication/Food allergy as listed  Skin= Negative for Rash, lesions, ulcers, itching  Objective: Vitals:   09/18/18 1000 09/18/18 2127 09/19/18 0500 09/19/18 0645  BP: (!) 178/74 (!) 162/61  (!) 165/72  Pulse: 70 68  66  Resp: 20 16  (!) 22  Temp: 98.2 F (36.8 C) 98.7 F (37.1 C)  98.4 F (36.9 C)  TempSrc: Oral Oral  Oral  SpO2: 100% 100%  100%  Weight:   60.5 kg   Height:        Intake/Output Summary (Last 24 hours) at 09/19/2018 1147 Last data filed at 09/19/2018 0837 Gross per 24 hour  Intake 899 ml  Output 0 ml  Net 899 ml   Filed Weights   09/17/18 0500 09/18/18 0406 09/19/18 0500  Weight: 57.8 kg 60.5 kg 60.5 kg    Examination:  Constitutional: N drowsy laying in the bed Eyes: PERRL, lids and conjunctivae normal ENMT: Mucous membranes are moist. Posterior pharynx clear of any exudate or lesions.Normal dentition.  Neck: normal, supple, no masses, no thyromegaly Respiratory: Bibasilar crackles Cardiovascular: Regular rate and rhythm, no murmurs / rubs / gallops. No extremity edema. 2+ pedal pulses. No carotid bruits.  Abdomen: no tenderness, no masses palpated. No hepatosplenomegaly. Bowel sounds positive.  Musculoskeletal: no clubbing / cyanosis. No joint deformity upper and lower extremities. Good ROM, no contractures. Normal muscle tone.  Skin: no rashes, lesions, ulcers. No induration Neurologic: CN 2-12 grossly intact. Sensation intact, DTR normal. Strength 4/5 in all 4.  Psychiatric: Poor judgment Right femoral dialysis catheter   Data Reviewed:   CBC: Recent Labs  Lab 09/15/18 1607 09/18/18 0506  WBC 7.7 6.6  NEUTROABS 6.1  --   HGB 9.5* 8.6*  HCT 29.5* 25.8*  MCV 89.7 86.6  PLT 269 981   Basic Metabolic Panel: Recent Labs  Lab 09/15/18 1607 09/15/18 1844 09/16/18 0537 09/16/18 2326 09/18/18 0506  NA 124* 127*  --  128* 126*  K 7.2* 6.4*  --  4.6 4.4  CL 94* 95*  --  92* 90*  CO2 16* 16*  --  23 20*  GLUCOSE 190* 46*  --  126* 194*  BUN 116* 116*  --  61* 75*  CREATININE 12.60* 12.63*  --  8.12* 9.75*  CALCIUM 9.5 8.8*  --  8.4* 8.7*  MG 2.6*  --   --   --  2.1  PHOS 7.1*  --  4.6 6.4*  --    GFR: Estimated Creatinine Clearance: 6 mL/min (A) (by C-G formula based on SCr of 9.75 mg/dL (H)). Liver Function Tests: Recent Labs  Lab 09/16/18 2326  ALBUMIN 2.5*   No results for input(s): LIPASE, AMYLASE in the last 168 hours. No results for input(s): AMMONIA in the last 168 hours. Coagulation Profile: No results for input(s): INR, PROTIME in the last 168 hours. Cardiac Enzymes: No results for input(s): CKTOTAL, CKMB, CKMBINDEX, TROPONINI in the last 168 hours. BNP (last 3 results) No results for input(s): PROBNP in the last 8760 hours. HbA1C: No results for input(s): HGBA1C in the last  72 hours. CBG: Recent Labs  Lab 09/18/18 1117 09/18/18 1610 09/18/18 2129 09/19/18 0723 09/19/18 1128  GLUCAP 216* 187* 128* 119* 175*   Lipid Profile: No results for input(s): CHOL, HDL, LDLCALC, TRIG, CHOLHDL, LDLDIRECT in the last 72 hours. Thyroid Function Tests: No results for input(s): TSH, T4TOTAL, FREET4, T3FREE, THYROIDAB in the last 72 hours. Anemia Panel: No results for input(s): VITAMINB12, FOLATE, FERRITIN, TIBC, IRON, RETICCTPCT in the last 72 hours. Sepsis Labs: No results for input(s): PROCALCITON, LATICACIDVEN in the last 168 hours.  Recent Results (from the past 240 hour(s))  SARS Coronavirus 2 (CEPHEID - Performed in [place] hospital lab), Hosp Order     Status: None    Collection Time: 09/15/18  3:49 PM  Result Value Ref Range Status   SARS Coronavirus 2 NEGATIVE NEGATIVE Final    Comment: (NOTE) If result is NEGATIVE SARS-CoV-2 target nucleic acids are NOT DETECTED. The SARS-CoV-2 RNA is generally detectable in upper and lower  respiratory specimens during the acute phase of infection. The lowest  concentration of SARS-CoV-2 viral copies this assay can detect is 250  copies / mL. A negative result does not preclude SARS-CoV-2 infection  and should not be used as the sole basis for treatment or other  patient management decisions.  A negative result may occur with  improper specimen collection / handling, submission of specimen other  than nasopharyngeal swab, presence of viral mutation(s) within the  areas targeted by this assay, and inadequate number of viral copies  (<250 copies / mL). A negative result must be combined with clinical  observations, patient history, and epidemiological information. If result is POSITIVE SARS-CoV-2 target nucleic acids are DETECTED. The SARS-CoV-2 RNA is generally detectable in upper and lower  respiratory specimens dur ing the acute phase of infection.  Positive  results are indicative of active infection with SARS-CoV-2.  Clinical  correlation with patient history and other diagnostic information is  necessary to determine patient infection status.  Positive results do  not rule out bacterial infection or co-infection with other viruses. If result is PRESUMPTIVE POSTIVE SARS-CoV-2 nucleic acids MAY BE PRESENT.   A presumptive positive result was obtained on the submitted specimen  and confirmed on repeat testing.  While 2019 novel coronavirus  (SARS-CoV-2) nucleic acids may be present in the submitted sample  additional confirmatory testing may be necessary for epidemiological  and / or clinical management purposes  to differentiate between  SARS-CoV-2 and other Sarbecovirus currently known to infect humans.   If clinically indicated additional testing with an alternate test  methodology [phone]) is advised. The SARS-CoV-2 RNA is generally  detectable in upper and lower respiratory sp ecimens during the acute  phase of infection. The expected result is Negative. Fact Sheet for Patients:  StrictlyIdeas.no Fact Sheet for Healthcare Providers: BankingDealers.co.za This test is not yet approved or cleared by the [place] FDA and has been authorized for detection and/or diagnosis of SARS-CoV-2 by FDA under an Emergency Use Authorization (EUA).  This EUA will remain in effect (meaning this test can be used) for the duration of the COVID-19 declaration under Section 564(b)(1) of the Act, 21 U.S.C. section 360bbb-3(b)(1), unless the authorization is terminated or revoked sooner. Performed at [facility], [address], [place],  [address]   MRSA PCR Screening     Status: None   Collection Time: 09/16/18 10:01 AM  Result Value Ref Range Status   MRSA by PCR NEGATIVE NEGATIVE Final    Comment:  The GeneXpert MRSA Assay (FDA approved for NASAL specimens only), is one component of a comprehensive MRSA colonization surveillance program. It is not intended to diagnose MRSA infection nor to guide or monitor treatment for MRSA infections. Performed at [facility], [place] [address], [place], [place] [address]          Radiology Studies: No results found.      Scheduled Meds: . aspirin EC  81 mg Oral Daily  . benztropine  1 mg Oral Daily  . buPROPion  300 mg Oral Daily  . Chlorhexidine Gluconate Cloth  6 each Topical Q0600  . divalproex  500 mg Oral QHS  . docusate sodium  100 mg Oral Daily  . fluPHENAZine  5 mg Oral Daily  . heparin  5,000 Units Subcutaneous Q8H  . insulin aspart  0-9 Units Subcutaneous TID WC  . latanoprost  1 drop Both Eyes QHS  . levothyroxine  25 mcg Oral QAC breakfast  . multivitamin  1  tablet Oral QHS  . risperiDONE  3 mg Oral QHS  . sevelamer carbonate  2,400 mg Oral TID WC  . simvastatin  40 mg Oral Daily  . sodium chloride flush  3 mL Intravenous Q12H   Continuous Infusions: . sodium chloride       LOS: 4 days   Time spent= 35 mins    Laura Aslinger Arsenio Loader, MD [facility]  If 7PM-7AM, please contact night-coverage [email or web address] 09/19/2018, 11:47 AM

## 2018-09-19 NOTE — Progress Notes (Signed)
  Palliative medicine progress note  Patient seen, chart reviewed.  Patient seems less alert today.  Bedside sitter also confirms that she has been sleeping most of the day.  Her speech remains difficult to understand.  She mumbles.  I am having a hard time keeping her awake to talk to her which is different from when I saw her on Friday.  Asked her where she lives she was unable to tell me that she lived at the [facility] in [place] which she was able to do on Friday.  I asked her again about dialysis.  She replies she is tired.  I asked her if she tired enough to that she wants to stop dialysis.  She closes her eyes at that point and does not answer me.  Per Retail banker, she has been refusing lab draws as well as vital signs.  Last creatinine available is 09/18/2018, 9.75.  I spoke to patient's brother, [name] and updated him as to See's current clinical condition.  I shared with him that I thought Laura was tired as she herself has verbalized to me multiple times and that I am seeing a clinical decline just since Friday from missing dialysis as evidenced by increased somnolence, increased confusion, decreased appetite.  [name] agrees that it is highly likely his sister is very tired and that she has had a difficult life.  He thinks by saying "I am tired", that is her way of saying she might be ready to stop hemodialysis.  I did ask him what has been effective in the past in terms of helping [name] to resume dialysis since I heard from the [facility] that this is a repeating process (she misses dialysis, requires hospitalization, then restarts dialysis and it starts all over).  [name] states that she just will eventually decide to go.  I told him I feared that she was reaching the point where she was not going to be alert enough very much longer to be able to make that decision.  [name] does not have a designated Press photographer.  She has 5 siblings.  What [name]  would like to do is have a conference call with all 5 siblings to kind of hopefully reach a consensus on stopping hemodialysis.  Patient Profile:57 y.o.femalewith past medical history of end-stage renal disease on hemodialysis noncompliant, hyper kalemia, diabetes, schizoaffective disorderadmitted on 5/27/2020with volume overload, altered mental status. Patient resides at [facility] in [place]. She had missed hemodialysis for approximately 1 week and presented with volume overload and altered mental status. Her potassium upon admission was 7.2.  Consult ordered for goals of care specifically that patient had refused hemodialysis on 5/29, 09/18/2018, as well as 09/19/2018.  She is also refusing lab draws as well as vital signs  Plan  Patient's brother [name] feels that a family conference with palliative medicine team would be helpful in resolving hemodialysis.  Palliative medicine team to reach out to brother [name] at [phone] in the morning to hopefully schedule conference call with all 5 siblings.  Thank you, Romona Curls, NP Total time: 35 minutes Greater than 50% of time was spent in counseling and coordination of care

## 2018-09-20 DIAGNOSIS — Z515 Encounter for palliative care: Secondary | ICD-10-CM

## 2018-09-20 DIAGNOSIS — Z66 Do not resuscitate: Secondary | ICD-10-CM

## 2018-09-20 LAB — GLUCOSE, CAPILLARY
Glucose-Capillary: 141 mg/dL — ABNORMAL HIGH (ref 70–99)
Glucose-Capillary: 143 mg/dL — ABNORMAL HIGH (ref 70–99)
Glucose-Capillary: 170 mg/dL — ABNORMAL HIGH (ref 70–99)
Glucose-Capillary: 137 mg/dL — ABNORMAL HIGH (ref 70–99)

## 2018-09-20 LAB — CBC
HCT: 26.9 % — ABNORMAL LOW (ref 36.0–46.0)
Hemoglobin: 8.8 g/dL — ABNORMAL LOW (ref 12.0–15.0)
MCH: 28.9 pg (ref 26.0–34.0)
MCHC: 32.7 g/dL (ref 30.0–36.0)
MCV: 88.5 fL (ref 80.0–100.0)
Platelets: 325 10*3/uL (ref 150–400)
RBC: 3.04 MIL/uL — ABNORMAL LOW (ref 3.87–5.11)
RDW: 15.6 % — ABNORMAL HIGH (ref 11.5–15.5)
WBC: 14.2 10*3/uL — ABNORMAL HIGH (ref 4.0–10.5)
nRBC: 0 % (ref 0.0–0.2)

## 2018-09-20 LAB — MAGNESIUM: Magnesium: 2.2 mg/dL (ref 1.7–2.4)

## 2018-09-20 LAB — BASIC METABOLIC PANEL
Anion gap: 17 — ABNORMAL HIGH (ref 5–15)
BUN: 96 mg/dL — ABNORMAL HIGH (ref 6–20)
CO2: 18 mmol/L — ABNORMAL LOW (ref 22–32)
Calcium: 9.4 mg/dL (ref 8.9–10.3)
Chloride: 90 mmol/L — ABNORMAL LOW (ref 98–111)
Creatinine, Ser: 11.98 mg/dL — ABNORMAL HIGH (ref 0.44–1.00)
GFR calc Af Amer: 4 mL/min — ABNORMAL LOW (ref >60)
GFR calc non Af Amer: 3 mL/min — ABNORMAL LOW (ref >60)
Glucose, Bld: 158 mg/dL — ABNORMAL HIGH (ref 70–99)
Potassium: 5.6 mmol/L — ABNORMAL HIGH (ref 3.5–5.1)
Sodium: 125 mmol/L — ABNORMAL LOW (ref 135–145)

## 2018-09-20 NOTE — Progress Notes (Signed)
Pt skin above Right femoral HD cath is red and warm to the touch. Pt states it is a little sore. Messaged IV team to come and assess central line.   Eleanora Neighbor, RN

## 2018-09-20 NOTE — Progress Notes (Signed)
Patient ID: SHAQUANDRA GALANO, female   DOB: 04-26-1960, 58 y.o.   MRN: [id number]  This NP visited patient at the bedside as a follow up to  yesterday's [place], assess for palliative medicine needs and emotional support. Patient is lethargic and oriented only to person currently.  I spoke to Cornelia Copa, her brother and telephone conference call is set for tonight at 6:00pm with siblings.  Entry---6:00pm  Siblings Vedia Pereyra all called in for conversation regarding diagnosis, prognosis, treatment options, GOCs, EOL wishes di spositon and options.  Dicussed natural trajectory and expecations of ESRD and the role of dialysis.  Discussed human mortality and concept of failure to thrive.  Created space and opportunity for all to explore their thoughts and feelings regarding current medical, psychological  and emotional situation.   There is no one person documented as HPOA. Siblings were open and engaged with each other,  and as a family came to the conclusion that it is best for Benay to forego dialysis now and into the future.  Plan of care:   Comfort and Dignity are main focus of  care - DNR/DNI - no artifical feeding or hydration now or in the future/diet as tolerated  - no further dialysis - transition to residential hospice in [place] or [facility] asap for EOL care- will discuss with SW in morning  Prognosis is less than two weeks.   Emotional support offered  Questions and concerns addressed   Will update attending in am.  Total time spent on the unit was 75 minutes    Time in 6pm time out 715pm  Greater than 50% of the time was spent in counseling and coordination of care  Wadie Lessen NP  Palliative Medicine Team Team Phone # [phone] Pager [phone]

## 2018-09-20 NOTE — Progress Notes (Signed)
Site above right femoral HD cath is red and warm to the touch. Pt states it is sore. The skin/tissue above the catheter also feels hard. IV team came to assess and states it feels like an abscess has formed.  Messaged MD on call and called [place] to make aware. Left number and awaiting call back.   Eleanora Neighbor, RN

## 2018-09-20 NOTE — Progress Notes (Signed)
PROGRESS NOTE    Laura STANDRE  Padilla DOB: Aug 26, 1960 DOA: 09/15/2018 PCP: Patient, No Pcp Per   Brief Narrative:  58 year old with history of ESRD on hemodialysis, diabetes mellitus type 2, essential hypertension, schizophrenia/bipolar disorder came to the hospital after missing her dialysis sessions for more than 1 week.  Overall she is a poor historian.  She was found to be volume overloaded with significant hyperkalemia of 7.2, sodium of 124, bicarb 16.  KG changes with slight peaked T waves, initially treated with calcium gluconate, sodium bicarb, Lokelma, Kayexalate.  Patient transferred to [facility] for urgent dialysis and nephrology consultation.  CT of the head was ordered due to somnolence.  Elevated 116.  Unfortunately patient continues to refuse HD in the hospital.  Palliative care team, nephrology and psychiatry has seen the patient.  Patient has been cleared by psychiatry.   Assessment & Plan:   Principal Problem:   Schizophrenia ([place]) Active Problems:   Altered mental status   ESRD (end stage renal disease) on dialysis (HCC)   Diabetes mellitus ([place])   Hyperkalemia   ARF (acute renal failure) (HCC)   Hyponatremia   Goals of care, counseling/discussion   Palliative care by specialist  Hyperkalemia due to dialysis noncompliance ESRD on hemodialysis Monday Wednesday Friday - Continues to refuse dialysis..  Patient understands that not receiving hemodialysis will lead to death and she is able to repeat this back to me.  Patient is also refusing to take her psychiatry meds.  Read below for details. -Nephrology team following. -Palliative care team following.  Patient is partial code- only NIPPV if necessary otherwise no intubation or CPR.  Patient has most form in the chart.  Nonpurulent right groin cellulitis, early - Question of infected right femoral HD catheter.  Warm to touch but no obvious signs of erythema.  Patient refusing any further evaluation of  this site.  If patient continues to refuse further dialysis, this needs to be removed.  Require some sort of line holiday but also refuses to take oral medication and further treatment for cellulitis for now.  Acute metabolic encephalopathy, likely uremic - CT head shows moderate atrophy but no acute intracranial pathology.  Also component of psych  Hyponatremia -Secondary to being volume overload  Diabetes mellitus type 2, insulin-dependent -Accu-Cheks and sliding scale.  Holding home Lantus until her oral intake improves  Hyperlipidemia -Continue statin  History of schizophrenia bipolar disorder - Resume Risperdal, Prolixin, Cogentin and Depakote.  Appreciate input from Dr. Vevelyn Royals inpatient psych required.  On 5/31 also spoke with Dr Tiburcio Pea over the phone- advised me that they are unable to offer any further help as patient is currently not combative, acutely suicidal or homicidal they cannot force medications on the patient that she does not want to take.  He has informed me that it is against the state slow to for psychiatry medications on someone who is otherwise calmly saying no to any treatment including medications.  She would benefit from long-term power of attorney.  Palliative care team following the patient, planning on family meeting to have all the siblings come to consensus about her long-term goals of care.  Hypothyroidism -Continue Synthroid  DVT prophylaxis: Subcu heparin Code Status: Full code Family Communication: None at bedside Disposition Plan: At this point patient continues to refuse dialysis.  Poor prognosis with eventual death.  Continues to refuse any medications for further care.  I am not sure how much capacity she has to make these decisions but  per psychiatry unable to force any medications on her.  Consultants:   Nephrology  Palliative care team  Psychiatry  Procedures:   None  Antimicrobials:   None   Subjective: Patient laying at  bedside denies any complaints.  Continues to refuse any oral medication, blood work and further dialysis.  Review of Systems Otherwise negative except as per HPI, including: General = no fevers, chills, dizziness, malaise, fatigue HEENT/EYES = negative for pain, redness, loss of vision, double vision, blurred vision, loss of hearing, sore throat, hoarseness, dysphagia Cardiovascular= negative for chest pain, palpitation, murmurs, lower extremity swelling Respiratory/lungs= negative for shortness of breath, cough, hemoptysis, wheezing, mucus production Gastrointestinal= negative for nausea, vomiting,, abdominal pain, melena, hematemesis Genitourinary= negative for Dysuria, Hematuria, Change in Urinary Frequency MSK = Negative for arthralgia, myalgias, Back Pain, Joint swelling  Neurology= Negative for headache, seizures, numbness, tingling  Psychiatry= Negative for anxiety, depression, suicidal and homocidal ideation Allergy/Immunology= Medication/Food allergy as listed  Skin= Negative for Rash, lesions, ulcers, itching   Objective: Vitals:   09/19/18 2049 09/20/18 0236 09/20/18 0523 09/20/18 0944  BP: (!) 134/53  (!) 145/65 (!) 160/67  Pulse: 61  67 79  Resp: 20  20 20   Temp: 98.3 F (36.8 C)  97.6 F (36.4 C) 99.5 F (37.5 C)  TempSrc: Oral  Oral Oral  SpO2: 100%  100% 100%  Weight: 60.2 kg 60.2 kg    Height:        Intake/Output Summary (Last 24 hours) at 09/20/2018 1119 Last data filed at 09/20/2018 0900 Gross per 24 hour  Intake 480 ml  Output 0 ml  Net 480 ml   Filed Weights   09/19/18 0500 09/19/18 2049 09/20/18 0236  Weight: 60.5 kg 60.2 kg 60.2 kg    Examination:  Patient wants to be left alone but she was able to let me examine her right groin femoral dialysis catheter.  There is warmth in that area with no obvious erythema noted.  No obvious bleeding noted at that site.  Data Reviewed:   CBC: Recent Labs  Lab 09/15/18 1607 09/18/18 0506 09/20/18 0759   WBC 7.7 6.6 14.2*  NEUTROABS 6.1  --   --   HGB 9.5* 8.6* 8.8*  HCT 29.5* 25.8* 26.9*  MCV 89.7 86.6 88.5  PLT 269 265 403   Basic Metabolic Panel: Recent Labs  Lab 09/15/18 1607 09/15/18 1844 09/16/18 0537 09/16/18 2326 09/18/18 0506 09/20/18 0759  NA 124* 127*  --  128* 126* 125*  K 7.2* 6.4*  --  4.6 4.4 5.6*  CL 94* 95*  --  92* 90* 90*  CO2 16* 16*  --  23 20* 18*  GLUCOSE 190* 46*  --  126* 194* 158*  BUN 116* 116*  --  61* 75* 96*  CREATININE 12.60* 12.63*  --  8.12* 9.75* 11.98*  CALCIUM 9.5 8.8*  --  8.4* 8.7* 9.4  MG 2.6*  --   --   --  2.1 2.2  PHOS 7.1*  --  4.6 6.4*  --   --    GFR: Estimated Creatinine Clearance: 4.9 mL/min (A) (by C-G formula based on SCr of 11.98 mg/dL (H)). Liver Function Tests: Recent Labs  Lab 09/16/18 2326  ALBUMIN 2.5*   No results for input(s): LIPASE, AMYLASE in the last 168 hours. No results for input(s): AMMONIA in the last 168 hours. Coagulation Profile: No results for input(s): INR, PROTIME in the last 168 hours. Cardiac Enzymes: No results for input(s): CKTOTAL,  CKMB, CKMBINDEX, TROPONINI in the last 168 hours. BNP (last 3 results) No results for input(s): PROBNP in the last 8760 hours. HbA1C: No results for input(s): HGBA1C in the last 72 hours. CBG: Recent Labs  Lab 09/19/18 0723 09/19/18 1128 09/19/18 1655 09/19/18 2053 09/20/18 0701  GLUCAP 119* 175* 135* 174* 141*   Lipid Profile: No results for input(s): CHOL, HDL, LDLCALC, TRIG, CHOLHDL, LDLDIRECT in the last 72 hours. Thyroid Function Tests: No results for input(s): TSH, T4TOTAL, FREET4, T3FREE, THYROIDAB in the last 72 hours. Anemia Panel: No results for input(s): VITAMINB12, FOLATE, FERRITIN, TIBC, IRON, RETICCTPCT in the last 72 hours. Sepsis Labs: No results for input(s): PROCALCITON, LATICACIDVEN in the last 168 hours.  Recent Results (from the past 240 hour(s))  SARS Coronavirus 2 (CEPHEID - Performed in [place] hospital lab), Hosp Order      Status: None   Collection Time: 09/15/18  3:49 PM  Result Value Ref Range Status   SARS Coronavirus 2 NEGATIVE NEGATIVE Final    Comment: (NOTE) If result is NEGATIVE SARS-CoV-2 target nucleic acids are NOT DETECTED. The SARS-CoV-2 RNA is generally detectable in upper and lower  respiratory specimens during the acute phase of infection. The lowest  concentration of SARS-CoV-2 viral copies this assay can detect is 250  copies / mL. A negative result does not preclude SARS-CoV-2 infection  and should not be used as the sole basis for treatment or other  patient management decisions.  A negative result may occur with  improper specimen collection / handling, submission of specimen other  than nasopharyngeal swab, presence of viral mutation(s) within the  areas targeted by this assay, and inadequate number of viral copies  (<250 copies / mL). A negative result must be combined with clinical  observations, patient history, and epidemiological information. If result is POSITIVE SARS-CoV-2 target nucleic acids are DETECTED. The SARS-CoV-2 RNA is generally detectable in upper and lower  respiratory specimens dur ing the acute phase of infection.  Positive  results are indicative of active infection with SARS-CoV-2.  Clinical  correlation with patient history and other diagnostic information is  necessary to determine patient infection status.  Positive results do  not rule out bacterial infection or co-infection with other viruses. If result is PRESUMPTIVE POSTIVE SARS-CoV-2 nucleic acids MAY BE PRESENT.   A presumptive positive result was obtained on the submitted specimen  and confirmed on repeat testing.  While 2019 novel coronavirus  (SARS-CoV-2) nucleic acids may be present in the submitted sample  additional confirmatory testing may be necessary for epidemiological  and / or clinical management purposes  to differentiate between  SARS-CoV-2 and other Sarbecovirus currently known to  infect humans.  If clinically indicated additional testing with an alternate test  methodology [phone]) is advised. The SARS-CoV-2 RNA is generally  detectable in upper and lower respiratory sp ecimens during the acute  phase of infection. The expected result is Negative. Fact Sheet for Patients:  StrictlyIdeas.no Fact Sheet for Healthcare Providers: BankingDealers.co.za This test is not yet approved or cleared by the [place] FDA and has been authorized for detection and/or diagnosis of SARS-CoV-2 by FDA under an Emergency Use Authorization (EUA).  This EUA will remain in effect (meaning this test can be used) for the duration of the COVID-19 declaration under Section 564(b)(1) of the Act, 21 U.S.C. section 360bbb-3(b)(1), unless the authorization is terminated or revoked sooner. Performed at [facility], [address] [place], [place] [address]   MRSA PCR Screening  Status: None   Collection Time: 09/16/18 10:01 AM  Result Value Ref Range Status   MRSA by PCR NEGATIVE NEGATIVE Final    Comment:        The GeneXpert MRSA Assay (FDA approved for NASAL specimens only), is one component of a comprehensive MRSA colonization surveillance program. It is not intended to diagnose MRSA infection nor to guide or monitor treatment for MRSA infections. Performed at [facility], [place] [address] [place], [place] [address]          Radiology Studies: No results found.      Scheduled Meds: . aspirin EC  81 mg Oral Daily  . benztropine  1 mg Oral Daily  . buPROPion  300 mg Oral Daily  . Chlorhexidine Gluconate Cloth  6 each Topical Q0600  . divalproex  500 mg Oral QHS  . docusate sodium  100 mg Oral Daily  . fluPHENAZine  5 mg Oral Daily  . heparin  5,000 Units Subcutaneous Q8H  . insulin aspart  0-9 Units Subcutaneous TID WC  . latanoprost  1 drop Both Eyes QHS  . levothyroxine  25 mcg Oral QAC breakfast  .  multivitamin  1 tablet Oral QHS  . risperiDONE  3 mg Oral QHS  . sevelamer carbonate  2,400 mg Oral TID WC  . simvastatin  40 mg Oral Daily  . sodium chloride flush  3 mL Intravenous Q12H   Continuous Infusions: . sodium chloride       LOS: 5 days   Time spent= 25 mins    Mazi Brailsford Arsenio Loader, MD [facility]  If 7PM-7AM, please contact night-coverage [email or web address] 09/20/2018, 11:19 AM

## 2018-09-20 NOTE — Progress Notes (Signed)
Patient ID: [name], female   DOB: [birth year], 58 y.o.   MRN: [id number] S: continues to refuse dialysis.  I said what do you choose.. dialysis or dying.  She said "dying I reckon " There was report that femoral HD cath was painful, she said "a little".  Possibly a conference call with pts siblings today ?      O:BP (!) 160/67 (BP Location: Left Arm)   Pulse 79   Temp 99.5 F (37.5 C) (Oral)   Resp 20   Ht 5\' 6"  (1.676 m)   Wt 60.2 kg   SpO2 100%   BMI 21.42 kg/m   Intake/Output Summary (Last 24 hours) at 09/20/2018 1232 Last data filed at 09/20/2018 0900 Gross per 24 hour  Intake 480 ml  Output 0 ml  Net 480 ml   Intake/Output: I/O last 3 completed shifts: In: 600 [P.O.:600] Out: 0   Intake/Output this shift:  Total I/O In: 240 [P.O.:240] Out: -  Weight change: -0.3 kg Gen: NAD CVS: no rub Resp: cta Abd: +BS, soft, NT Ext: 1+ edema, right fem tdc- not that painful, no discharge , clotted RUE HeroAVG  Recent Labs  Lab 09/15/18 1607 09/15/18 1844 09/16/18 0537 09/16/18 2326 09/18/18 0506 09/20/18 0759  NA 124* 127*  --  128* 126* 125*  K 7.2* 6.4*  --  4.6 4.4 5.6*  CL 94* 95*  --  92* 90* 90*  CO2 16* 16*  --  23 20* 18*  GLUCOSE 190* 46*  --  126* 194* 158*  BUN 116* 116*  --  61* 75* 96*  CREATININE 12.60* 12.63*  --  8.12* 9.75* 11.98*  ALBUMIN  --   --   --  2.5*  --   --   CALCIUM 9.5 8.8*  --  8.4* 8.7* 9.4  PHOS 7.1*  --  4.6 6.4*  --   --    Liver Function Tests: Recent Labs  Lab 09/16/18 2326  ALBUMIN 2.5*   No results for input(s): LIPASE, AMYLASE in the last 168 hours. No results for input(s): AMMONIA in the last 168 hours. CBC: Recent Labs  Lab 09/15/18 1607 09/18/18 0506 09/20/18 0759  WBC 7.7 6.6 14.2*  NEUTROABS 6.1  --   --   HGB 9.5* 8.6* 8.8*  HCT 29.5* 25.8* 26.9*  MCV 89.7 86.6 88.5  PLT 269 265 325   Cardiac Enzymes: No results for input(s): CKTOTAL, CKMB, CKMBINDEX, TROPONINI in the last 168 hours. CBG: Recent Labs   Lab 09/19/18 1128 09/19/18 1655 09/19/18 2053 09/20/18 0701 09/20/18 1120  GLUCAP 175* 135* 174* 141* 143*    Iron Studies: No results for input(s): IRON, TIBC, TRANSFERRIN, FERRITIN in the last 72 hours. Studies/Results: No results found. [place] Kitchen aspirin EC  81 mg Oral Daily  . benztropine  1 mg Oral Daily  . buPROPion  300 mg Oral Daily  . Chlorhexidine Gluconate Cloth  6 each Topical Q0600  . divalproex  500 mg Oral QHS  . docusate sodium  100 mg Oral Daily  . fluPHENAZine  5 mg Oral Daily  . heparin  5,000 Units Subcutaneous Q8H  . insulin aspart  0-9 Units Subcutaneous TID WC  . latanoprost  1 drop Both Eyes QHS  . levothyroxine  25 mcg Oral QAC breakfast  . multivitamin  1 tablet Oral QHS  . risperiDONE  3 mg Oral QHS  . sevelamer carbonate  2,400 mg Oral TID WC  . simvastatin  40 mg Oral  Daily  . sodium chloride flush  3 mL Intravenous Q12H    BMET    Component Value Date/Time   NA 125 (L) 09/20/2018 0759   NA 137 09/17/2012 1205   K 5.6 (H) 09/20/2018 0759   K 4.2 09/17/2012 1205   CL 90 (L) 09/20/2018 0759   CL 99 09/17/2012 1205   CO2 18 (L) 09/20/2018 0759   CO2 29 09/17/2012 1205   GLUCOSE 158 (H) 09/20/2018 0759   GLUCOSE 145 (H) 09/17/2012 1205   BUN 96 (H) 09/20/2018 0759   BUN 35 (H) 09/17/2012 1205   CREATININE 11.98 (H) 09/20/2018 0759   CREATININE 6.81 (H) 09/17/2012 1205   CALCIUM 9.4 09/20/2018 0759   CALCIUM 9.3 09/17/2012 1205   GFRNONAA 3 (L) 09/20/2018 0759   GFRNONAA 6 (L) 09/17/2012 1205   GFRAA 4 (L) 09/20/2018 0759   GFRAA 7 (L) 09/17/2012 1205   CBC    Component Value Date/Time   WBC 14.2 (H) 09/20/2018 0759   RBC 3.04 (L) 09/20/2018 0759   HGB 8.8 (L) 09/20/2018 0759   HGB 10.1 (L) 09/17/2012 1205   HCT 26.9 (L) 09/20/2018 0759   HCT 29.5 (L) 09/17/2012 1205   PLT 325 09/20/2018 0759   PLT 129 (L) 09/17/2012 1205   MCV 88.5 09/20/2018 0759   MCV 97 09/17/2012 1205   MCH 28.9 09/20/2018 0759   MCHC 32.7 09/20/2018 0759    RDW 15.6 (H) 09/20/2018 0759   RDW 15.5 (H) 09/17/2012 1205   LYMPHSABS 0.9 09/15/2018 1607   LYMPHSABS 0.2 (L) 05/21/2012 1654   MONOABS 0.7 09/15/2018 1607   MONOABS 0.6 05/21/2012 1654   EOSABS 0.1 09/15/2018 1607   EOSABS 0.0 05/21/2012 1654   BASOSABS 0.0 09/15/2018 1607   BASOSABS 0.0 05/21/2012 1654    Assessment/ Plan:  1. AMS presumably due to Uremia from noncompliance with HD 2. Suicidal ideations- pt with h/o bipolar disorder and will need psychiatric evaluation 1. Pt continues to refuse HD. 2. Seen by Psych 09/17/18, they are unable to offer any further help- pt not acutely suicidal ?  Even though she seems to understand that no dialysis will lead to death.  Appreciate palliative care consult - to speak with her siblings today and possibly give her permission to stop dialysis  3. ESRDmissed HD for over a week with significant uremia  urgent HD overnight Wed - has been refusing since. I spoke with her about the consequences of not having further HD. She seems to understand for me 4. Anemia:follow h/h and contact HD unit for ESA rx 5. CKD-MBD:phos improved with binders 6. Nutrition:renal diet 7. Hypertension:improved with HD and UF.Elevated today as she refused meds and HD 8. Disposition- seeming like she understands what she is doing.  It appears that brother thinks that she wants to stop dialysis- awaiting outcome of this call.  It would really not be right to force her into doing dialysis if she really does not want    Louis Meckel

## 2018-09-21 ENCOUNTER — Encounter (HOSPITAL_COMMUNITY): Payer: Self-pay | Admitting: Interventional Radiology

## 2018-09-21 ENCOUNTER — Inpatient Hospital Stay (HOSPITAL_COMMUNITY): Payer: Medicaid Other

## 2018-09-21 DIAGNOSIS — R531 Weakness: Secondary | ICD-10-CM

## 2018-09-21 HISTORY — PX: IR REMOVAL TUN CV CATH W/O FL: IMG2289

## 2018-09-21 LAB — GLUCOSE, CAPILLARY
Glucose-Capillary: 203 mg/dL — ABNORMAL HIGH (ref 70–99)
Glucose-Capillary: 237 mg/dL — ABNORMAL HIGH (ref 70–99)

## 2018-09-21 LAB — SARS CORONAVIRUS 2 BY RT PCR (HOSPITAL ORDER, PERFORMED IN ~~LOC~~ HOSPITAL LAB): SARS Coronavirus 2: NEGATIVE

## 2018-09-21 MED ORDER — CHLORHEXIDINE GLUCONATE 4 % EX LIQD
CUTANEOUS | Status: AC
  Filled 2018-09-21: qty 15

## 2018-09-21 MED ORDER — LIDOCAINE HCL 1 % IJ SOLN
INTRAMUSCULAR | Status: DC | PRN
  Administered 2018-09-21: 10 mL

## 2018-09-21 MED ORDER — LIDOCAINE HCL 1 % IJ SOLN
INTRAMUSCULAR | Status: AC
  Filled 2018-09-21: qty 20

## 2018-09-21 NOTE — Progress Notes (Signed)
I see plans to stop HD and transition to comfort care.  This seems appropriate in this setting   [name]

## 2018-09-21 NOTE — TOC Transition Note (Signed)
Transition of Care [facility]) - CM/SW Discharge Note   Patient Details  Name: [name] MRN: [id number] Date of Birth: 10-03-1960  [facility]) CM/SW Contact:  Sable Feil, LCSW Phone Number: 09/21/2018, 5:38 PM   Clinical Narrative:  CSW talked with brother regarding patient's discharge. She is now comfort care and family requests that she return to [facility] with hospice services. CSW talked with admissions director, Desma Mcgregor and confirmed that patient can return comfort care, however a Hospice facility will not be able to come into the facility. Per Marden Noble, they will be able to provide comfort care services to patient. Brother contacted, informed and was agreeable to patient returning with comfort care services provided by the facility. Mr. Lacap expressed that patient has been at the facility a while, the staff know her and they are comfortable with her returning there.    Final next level of care: [place] to Discharge: No Barriers Identified   Patient Goals and CMS Choice Patient states their goals for this hospitalization and ongoing recovery are:: Family requests patient return to [facility] comfort care CMS Medicare.gov Compare Post Acute Care list provided to:: Other (Comment Required)(List not needed as patient from facility) Choice offered to / list presented to : NA  Discharge Placement   Existing PASRR number confirmed : 09/17/18          Patient chooses bed at: [facility] Patient to be transferred to facility by: Ambulance Name of family member notified: Silvina Hackleman, [phone] Patient and family notified of of transfer: 09/21/18  Discharge Plan and Services In-house Referral: Clinical Social Work Discharge Planning Services: NA Post Acute Care Choice: [place]          DME Arranged: N/A DME Agency: NA       HH Arranged:  NA HH Agency: NA        Social Determinants of Health (SDOH) Interventions  No SDOH interventions needed at this time.   Readmission Risk Interventions Readmission Risk Prevention Plan 09/16/2018  Transportation Screening Complete  PCP or Specialist Appt within 3-5 Days Not Complete  HRI or [place] Not Complete  HRI or Home Care Consult comments see above  Social Work Consult for [place] Planning/Counseling Complete  Medication Review Press photographer) Complete  Some recent data might be hidden

## 2018-09-21 NOTE — Progress Notes (Signed)
Received consult for HD cath removal: Pt has tunneled cath. Site re-dressed. Pus noted at insertion site, erythema noted above site in groin area. RN made aware that nursing does not remove tunneled cath.

## 2018-09-21 NOTE — Progress Notes (Signed)
Report given to Fredna Dow at [facility]. Pt waiting for [facility] for transport.

## 2018-09-21 NOTE — Progress Notes (Signed)
Renal Navigator notes documentation from PMT that family has met to discuss [name] and it has been determined that it is best for patient to forego dialysis now and into the future in order to shift focus to Comfort and Dignity and transition to [facility].  Renal Navigator will notify patient's OP HD clinic/[place] of this determination and she will be discharged from the facility.  Alphonzo Cruise, [place] Renal Navigator [phone]

## 2018-09-21 NOTE — Procedures (Signed)
esrd  S/p RT FEM HD CATH REMOVAL  NO COMP STABLE EBL MIN

## 2018-09-21 NOTE — Progress Notes (Signed)
Patient ID: [name], female   DOB: [birth year], 58 y.o.   MRN: [id number]  This NP visited patient at the bedside as a follow up for palliative medicine needs and emotional support.  Patient is oriented only to person currently, sitting on edge of bed.   Siblings Vedia Pereyra all called in last night for conversation regarding diagnosis, prognosis, treatment options, GOCs, EOL wishes dispositon and options.  There is no one person documented as HPOA.  However all siblings work together and are in consensus to shift to a full comfort path for the patient, their sister, [name].  I spoke this morning with Algernon Huxley contact and he tells me that the family has made decision and the hope is for the patient to transition back to her skilled nursing facility with hospice services in place for end-of-life care.   Plan of care:   Comfort and Dignity are main focus of  Care    Allow a natural death - DNR/DNI - no artifical feeding or hydration now or in the future/diet as tolerated  - no further dialysis - no rehospitalization - transition back to skilled nursing facility with hospice services in place for end-of-life care  Prognosis is less than two weeks.   Emotional support offered.    Discussed with Dr Reesa Chew and Lorriane Shire LCSW   Total time spent on the unit was 35 minutes      Greater than 50% of the time was spent in counseling and coordination of care  Wadie Lessen NP  Palliative Medicine Team Team Phone # [phone] Pager [phone]

## 2018-09-21 NOTE — Discharge Summary (Addendum)
Physician Discharge Summary  [name]:[id number] DOB: 1960/09/17 DOA: 09/15/2018  PCP: Patient, No Pcp Per  Admit date: 09/15/2018 Discharge date: 09/21/2018  Admitted From: Home Disposition:  SNF with comfort care.   Recommendations for Outpatient Follow-up:  1. Follow up with PCP in 1-2 weeks 2. Please obtain BMP/CBC in one week your next doctors visit.  3. No further Dialysis. HD catheter will be discontinued.  4. Stop all medications   Discharge Condition: Stable CODE STATUS: DNR Diet recommendation: As tolerated, comfort feeding.   Brief/Interim Summary: 58 year old with history of ESRD on hemodialysis, diabetes mellitus type 2, essential hypertension, schizophrenia/bipolar disorder came to the hospital after missing her dialysis sessions for more than 1 week.  Overall she is a poor historian.  She was found to be volume overloaded with significant hyperkalemia of 7.2, sodium of 124, bicarb 16.  KG changes with slight peaked T waves, initially treated with calcium gluconate, sodium bicarb, Lokelma, Kayexalate.  Patient transferred to [facility] for urgent dialysis and nephrology consultation.  CT of the head was ordered due to somnolence.  Elevated 116.  Unfortunately patient continues to refuse HD in the hospital.  Palliative care team, nephrology and psychiatry has seen the patient.  Patient has been cleared by psychiatry.  After extensive discussion between family members and palliative care team it was determined to no further continue her dialysis and transition her to hospice care.  They are aware that her life expectancy is likely less than 2 weeks.  There was also question of right groin infection at her tunnel catheter site but patient continues to refuse all the medications including antibiotics.  At this time we will remove her tunneled catheter from the right groin as we are no longer pursuing hemodialysis.  We will discharge the patient to residential hospice when  there is a bed available.  Social worker has been consulted. Otherwise patient is stable.   Discharge Diagnoses:  Principal Problem:   Schizophrenia ([place]) Active Problems:   Altered mental status   ESRD (end stage renal disease) on dialysis (HCC)   Diabetes mellitus (HCC)   Hyperkalemia   ARF (acute renal failure) (HCC)   Hyponatremia   Goals of care, counseling/discussion   Palliative care by specialist   DNR (do not resuscitate)   Weakness generalized  Hyperkalemia due to dialysis noncompliance ESRD on hemodialysis Monday Wednesday Friday - Continues to refuse dialysis.  Patient has been seen by psychiatry, patient refusing her oral psychiatry medications as well.  Case was discussed with psychiatrist as described below. -Discontinue her dialysis catheter. -Nephrology team following. -Palliative care team following.    Appreciate their input, transition patient to hospice  Nonpurulent right groin cellulitis, early - Unfortunately her right femoral tunneled catheter appears to be infected.  No longer pursuing dialysis therefore we will remove this.  She is refusing any sort of antibiotics oral or IV.  Acute metabolic encephalopathy, likely uremic - CT head shows moderate atrophy but no acute intracranial pathology.  Also component of psych  Hyponatremia -Secondary to being volume overload  Diabetes mellitus type 2, insulin-dependent -Accu-Cheks and sliding scale.  Holding home Lantus until her oral intake improves  Hyperlipidemia -Continue statin  History of schizophrenia bipolar disorder - Resume Risperdal, Prolixin, Cogentin and Depakote.  Appreciate input from Dr. Vevelyn Royals inpatient psych required.  On 5/31 also spoke with Dr Tiburcio Pea over the phone- advised me that they are unable to offer any further help as patient is currently not combative,  acutely suicidal or homicidal they cannot force medications on the patient that she does not want to take.  He has  informed me that it is against the state slow to for psychiatry medications on someone who is otherwise calmly saying no to any treatment including medications.  After prolonged discussion between palliative care team and the family members-plan is to transition patient to hospice  Hypothyroidism -Continue Synthroid  She is refusing all of her medications therefore will discontinue it.  DVT prophylaxis: Subcu heparin Code Status: Full code Family Communication: None at bedside Disposition Plan:  Social worker contacted, transition to hospice when bed is available.   Consultations:  Nephrology  Psychiatry  Palliative care  Subjective: Patient is sitting at the side of the bed overall very pleasant.  Mostly repeating what I am saying but able to carry on very basic conversation.  She continues to refuse any future dialysis or medical treatment.  She is also aware that failure to do so will lead to death likely less than 2 weeks.  Discharge Exam: Vitals:   09/21/18 0609 09/21/18 0932  BP: (!) 174/74 (!) 154/66  Pulse: 76 70  Resp: (!) 24 18  Temp: (!) 100.8 F (38.2 C) 98.4 F (36.9 C)  SpO2: 99% 100%   Vitals:   09/20/18 2202 09/21/18 0200 09/21/18 0609 09/21/18 0932  BP: (!) 175/64  (!) 174/74 (!) 154/66  Pulse: 73  76 70  Resp: (!) 22  (!) 24 18  Temp: (!) 102.7 F (39.3 C) 99.9 F (37.7 C) (!) 100.8 F (38.2 C) 98.4 F (36.9 C)  TempSrc: Oral Oral Oral Oral  SpO2: 97%  99% 100%  Weight:      Height:        General: Pt is alert, awake, not in acute distress Cardiovascular: RRR, S1/S2 +, no rubs, no gallops Respiratory: CTA bilaterally, no wheezing, no rhonchi Abdominal: Soft, NT, ND, bowel sounds + Extremities: no edema, no cyanosis  Discharge Instructions   Allergies as of 09/21/2018   No Known Allergies     Medication List    STOP taking these medications   amLODipine 10 MG tablet Commonly known as:  NORVASC   aspirin EC 81 MG tablet    calcium acetate 667 MG capsule Commonly known as:  PHOSLO   cinacalcet 30 MG tablet Commonly known as:  SENSIPAR   divalproex 500 MG DR tablet Commonly known as:  DEPAKOTE   insulin glargine 100 UNIT/ML injection Commonly known as:  LANTUS   latanoprost 0.005 % ophthalmic solution Commonly known as:  XALATAN   Melatonin 5 MG Tabs   multivitamin with minerals Tabs tablet   paliperidone 3 MG 24 hr tablet Commonly known as:  INVEGA   QUEtiapine 50 MG tablet Commonly known as:  SEROQUEL   rosuvastatin 5 MG tablet Commonly known as:  CRESTOR   sertraline 50 MG tablet Commonly known as:  ZOLOFT       No Known Allergies  You were cared for by a hospitalist during your hospital stay. If you have any questions about your discharge medications or the care you received while you were in the hospital after you are discharged, you can call the unit and asked to speak with the hospitalist on call if the hospitalist that took care of you is not available. Once you are discharged, your primary care physician will handle any further medical issues. Please note that no refills for any discharge medications will be authorized once you are  discharged, as it is imperative that you return to your primary care physician (or establish a relationship with a primary care physician if you do not have one) for your aftercare needs so that they can reassess your need for medications and monitor your lab values.   Procedures/Studies: Ct Head Wo Contrast  Result Date: 09/16/2018 CLINICAL DATA:  Altered mental status. History of schizophrenia and bipolar disorder EXAM: CT HEAD WITHOUT CONTRAST TECHNIQUE: Contiguous axial images were obtained from the base of the skull through the vertex without intravenous contrast. COMPARISON:  October 04, 2017 FINDINGS: Brain: There is moderate diffuse atrophy. There is no intracranial mass, hemorrhage, extra-axial fluid collection, or midline shift. There is minimal  small vessel disease in centra semiovale bilaterally. No evident acute infarct. Vascular: There is no hyperdense vessel. There are foci of calcification in each distal vertebral artery as well as in each carotid siphon region. Skull: The bony calvarium appears intact. Sinuses/Orbits: There is mucosal thickening in several ethmoid air cells. Visualized paranasal sinuses otherwise are clear. Orbits appear symmetric bilaterally. Other: Mastoids are clear. Mastoids on the left are somewhat hypoplastic. IMPRESSION: Moderate atrophy with slight periventricular small vessel disease. No acute infarct. No mass or hemorrhage. There are foci of arterial vascular calcification. There is mucosal thickening in several ethmoid air cells. Electronically Signed   By: Lowella Grip III M.D.   On: 09/16/2018 09:05     The results of significant diagnostics from this hospitalization (including imaging, microbiology, ancillary and laboratory) are listed below for reference.     Microbiology: Recent Results (from the past 240 hour(s))  SARS Coronavirus 2 (CEPHEID - Performed in [place] hospital lab), Hosp Order     Status: None   Collection Time: 09/15/18  3:49 PM  Result Value Ref Range Status   SARS Coronavirus 2 NEGATIVE NEGATIVE Final    Comment: (NOTE) If result is NEGATIVE SARS-CoV-2 target nucleic acids are NOT DETECTED. The SARS-CoV-2 RNA is generally detectable in upper and lower  respiratory specimens during the acute phase of infection. The lowest  concentration of SARS-CoV-2 viral copies this assay can detect is 250  copies / mL. A negative result does not preclude SARS-CoV-2 infection  and should not be used as the sole basis for treatment or other  patient management decisions.  A negative result may occur with  improper specimen collection / handling, submission of specimen other  than nasopharyngeal swab, presence of viral mutation(s) within the  areas targeted by this assay, and inadequate  number of viral copies  (<250 copies / mL). A negative result must be combined with clinical  observations, patient history, and epidemiological information. If result is POSITIVE SARS-CoV-2 target nucleic acids are DETECTED. The SARS-CoV-2 RNA is generally detectable in upper and lower  respiratory specimens dur ing the acute phase of infection.  Positive  results are indicative of active infection with SARS-CoV-2.  Clinical  correlation with patient history and other diagnostic information is  necessary to determine patient infection status.  Positive results do  not rule out bacterial infection or co-infection with other viruses. If result is PRESUMPTIVE POSTIVE SARS-CoV-2 nucleic acids MAY BE PRESENT.   A presumptive positive result was obtained on the submitted specimen  and confirmed on repeat testing.  While 2019 novel coronavirus  (SARS-CoV-2) nucleic acids may be present in the submitted sample  additional confirmatory testing may be necessary for epidemiological  and / or clinical management purposes  to differentiate between  SARS-CoV-2 and other Sarbecovirus  currently known to infect humans.  If clinically indicated additional testing with an alternate test  methodology [phone]) is advised. The SARS-CoV-2 RNA is generally  detectable in upper and lower respiratory sp ecimens during the acute  phase of infection. The expected result is Negative. Fact Sheet for Patients:  StrictlyIdeas.no Fact Sheet for Healthcare Providers: BankingDealers.co.za This test is not yet approved or cleared by the [place] FDA and has been authorized for detection and/or diagnosis of SARS-CoV-2 by FDA under an Emergency Use Authorization (EUA).  This EUA will remain in effect (meaning this test can be used) for the duration of the COVID-19 declaration under Section 564(b)(1) of the Act, 21 U.S.C. section 360bbb-3(b)(1), unless the  authorization is terminated or revoked sooner. Performed at [facility], [address] [place], [place] [address]   MRSA PCR Screening     Status: None   Collection Time: 09/16/18 10:01 AM  Result Value Ref Range Status   MRSA by PCR NEGATIVE NEGATIVE Final    Comment:        The GeneXpert MRSA Assay (FDA approved for NASAL specimens only), is one component of a comprehensive MRSA colonization surveillance program. It is not intended to diagnose MRSA infection nor to guide or monitor treatment for MRSA infections. Performed at [facility], [place] [address] [place], [place] [address]      Labs: BNP (last 3 results) No results for input(s): BNP in the last 8760 hours. Basic Metabolic Panel: Recent Labs  Lab 09/15/18 1607 09/15/18 1844 09/16/18 0537 09/16/18 2326 09/18/18 0506 09/20/18 0759  NA 124* 127*  --  128* 126* 125*  K 7.2* 6.4*  --  4.6 4.4 5.6*  CL 94* 95*  --  92* 90* 90*  CO2 16* 16*  --  23 20* 18*  GLUCOSE 190* 46*  --  126* 194* 158*  BUN 116* 116*  --  61* 75* 96*  CREATININE 12.60* 12.63*  --  8.12* 9.75* 11.98*  CALCIUM 9.5 8.8*  --  8.4* 8.7* 9.4  MG 2.6*  --   --   --  2.1 2.2  PHOS 7.1*  --  4.6 6.4*  --   --    Liver Function Tests: Recent Labs  Lab 09/16/18 2326  ALBUMIN 2.5*   No results for input(s): LIPASE, AMYLASE in the last 168 hours. No results for input(s): AMMONIA in the last 168 hours. CBC: Recent Labs  Lab 09/15/18 1607 09/18/18 0506 09/20/18 0759  WBC 7.7 6.6 14.2*  NEUTROABS 6.1  --   --   HGB 9.5* 8.6* 8.8*  HCT 29.5* 25.8* 26.9*  MCV 89.7 86.6 88.5  PLT 269 265 325   Cardiac Enzymes: No results for input(s): CKTOTAL, CKMB, CKMBINDEX, TROPONINI in the last 168 hours. BNP: Invalid input(s): POCBNP CBG: Recent Labs  Lab 09/19/18 2053 09/20/18 0701 09/20/18 1120 09/20/18 1627 09/20/18 2030  GLUCAP 174* 141* 143* 170* 137*   D-Dimer No results for input(s): DDIMER in the last 72 hours. Hgb A1c No  results for input(s): HGBA1C in the last 72 hours. Lipid Profile No results for input(s): CHOL, HDL, LDLCALC, TRIG, CHOLHDL, LDLDIRECT in the last 72 hours. Thyroid function studies No results for input(s): TSH, T4TOTAL, T3FREE, THYROIDAB in the last 72 hours.  Invalid input(s): FREET3 Anemia work up No results for input(s): VITAMINB12, FOLATE, FERRITIN, TIBC, IRON, RETICCTPCT in the last 72 hours. Urinalysis    Component Value Date/Time   COLORURINE Yellow 09/17/2012 1205  APPEARANCEUR Clear 09/17/2012 1205   LABSPEC 1.013 09/17/2012 1205   PHURINE 9.0 09/17/2012 1205   GLUCOSEU Negative 09/17/2012 1205   HGBUR Negative 09/17/2012 1205   BILIRUBINUR Negative 09/17/2012 [place] 09/17/2012 1205   PROTEINUR 30 mg/dL 09/17/2012 1205   NITRITE Negative 09/17/2012 1205   LEUKOCYTESUR Negative 09/17/2012 1205   Sepsis Labs Invalid input(s): PROCALCITONIN,  WBC,  LACTICIDVEN Microbiology Recent Results (from the past 240 hour(s))  SARS Coronavirus 2 (CEPHEID - Performed in [place] hospital lab), Hosp Order     Status: None   Collection Time: 09/15/18  3:49 PM  Result Value Ref Range Status   SARS Coronavirus 2 NEGATIVE NEGATIVE Final    Comment: (NOTE) If result is NEGATIVE SARS-CoV-2 target nucleic acids are NOT DETECTED. The SARS-CoV-2 RNA is generally detectable in upper and lower  respiratory specimens during the acute phase of infection. The lowest  concentration of SARS-CoV-2 viral copies this assay can detect is 250  copies / mL. A negative result does not preclude SARS-CoV-2 infection  and should not be used as the sole basis for treatment or other  patient management decisions.  A negative result may occur with  improper specimen collection / handling, submission of specimen other  than nasopharyngeal swab, presence of viral mutation(s) within the  areas targeted by this assay, and inadequate number of viral copies  (<250 copies / mL). A negative  result must be combined with clinical  observations, patient history, and epidemiological information. If result is POSITIVE SARS-CoV-2 target nucleic acids are DETECTED. The SARS-CoV-2 RNA is generally detectable in upper and lower  respiratory specimens dur ing the acute phase of infection.  Positive  results are indicative of active infection with SARS-CoV-2.  Clinical  correlation with patient history and other diagnostic information is  necessary to determine patient infection status.  Positive results do  not rule out bacterial infection or co-infection with other viruses. If result is PRESUMPTIVE POSTIVE SARS-CoV-2 nucleic acids MAY BE PRESENT.   A presumptive positive result was obtained on the submitted specimen  and confirmed on repeat testing.  While 2019 novel coronavirus  (SARS-CoV-2) nucleic acids may be present in the submitted sample  additional confirmatory testing may be necessary for epidemiological  and / or clinical management purposes  to differentiate between  SARS-CoV-2 and other Sarbecovirus currently known to infect humans.  If clinically indicated additional testing with an alternate test  methodology [phone]) is advised. The SARS-CoV-2 RNA is generally  detectable in upper and lower respiratory sp ecimens during the acute  phase of infection. The expected result is Negative. Fact Sheet for Patients:  StrictlyIdeas.no Fact Sheet for Healthcare Providers: BankingDealers.co.za This test is not yet approved or cleared by the [place] FDA and has been authorized for detection and/or diagnosis of SARS-CoV-2 by FDA under an Emergency Use Authorization (EUA).  This EUA will remain in effect (meaning this test can be used) for the duration of the COVID-19 declaration under Section 564(b)(1) of the Act, 21 U.S.C. section 360bbb-3(b)(1), unless the authorization is terminated or revoked sooner. Performed at [facility], [address], [place], [place] [address]   MRSA PCR Screening     Status: None   Collection Time: 09/16/18 10:01 AM  Result Value Ref Range Status   MRSA by PCR NEGATIVE NEGATIVE Final    Comment:        The GeneXpert MRSA Assay (FDA approved for NASAL specimens only), is one component of  a comprehensive MRSA colonization surveillance program. It is not intended to diagnose MRSA infection nor to guide or monitor treatment for MRSA infections. Performed at [facility], [place] [address] [place],  [address]      Time coordinating discharge:  I have spent 35 minutes face to face with the patient and on the ward discussing the patients care, assessment, plan and disposition with other care givers. >50% of the time was devoted counseling the patient about the risks and benefits of treatment/Discharge disposition and coordinating care.   SIGNED:   Damita Lack, MD  [facility] 09/21/2018, 10:47 AM   If 7PM-7AM, please contact night-coverage [email or web address]

## 2018-09-22 LAB — GLUCOSE, CAPILLARY: Glucose-Capillary: 136 mg/dL — ABNORMAL HIGH (ref 70–99)

## 2018-09-23 NOTE — Progress Notes (Addendum)
[facility] to be discharged Skilled nursing facility per MD order. Patient verbalized understanding.  Skin clean, dry and intact without evidence of skin break down, no evidence of skin tears noted. IV catheter discontinued intact. Site without signs and symptoms of complications. Dressing and pressure applied. Pt denies pain at the site currently. No complaints noted.  Patient free of lines, drains, and wounds.   Discharge packet assembled. An After Visit Summary (AVS) was printed and given to the EMS personnel. Patient escorted via stretcher and discharged to [facility] via ambulance. Report called to accepting facility; all questions and concerns addressed.   Quentin Angst, RN

## 2019-06-09 IMAGING — MR MR HEAD W/O CM
5 series · 39 of 48 positions shown · non-contrast
Comparison: Head CT from 2 days ago

CLINICAL DATA: Altered level of consciousness for 2 days

EXAM:
MRI HEAD WITHOUT CONTRAST
TECHNIQUE: Multiplanar, multiecho pulse sequences of the brain and surrounding
structures were obtained without intravenous contrast.

[Series 3: [id number] · sagittal · 5.0mm · 0.42mm/px · 5 of 20 slices shown]
[im 1/20]
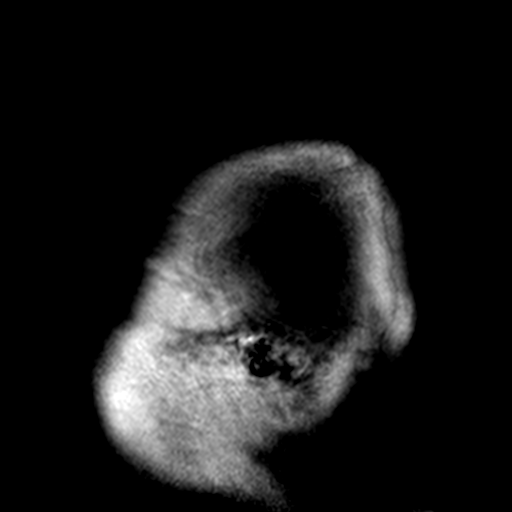
[im 4/20]
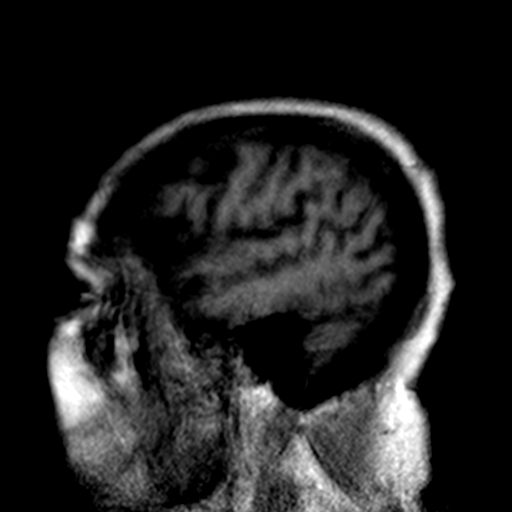
[im 8/20]
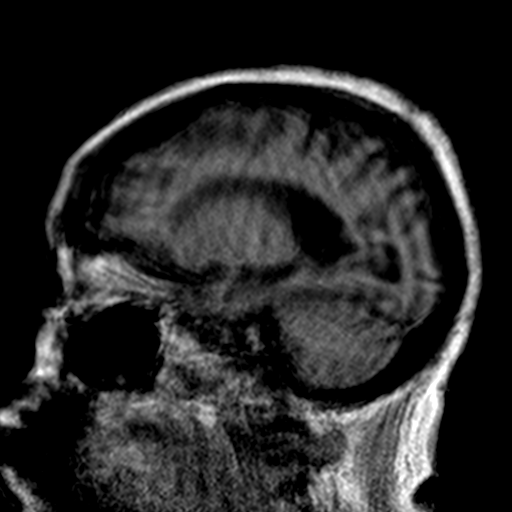
[im 12/20]
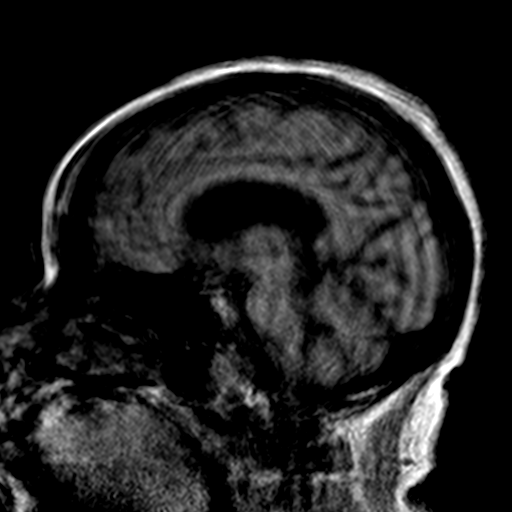
[im 16/20]
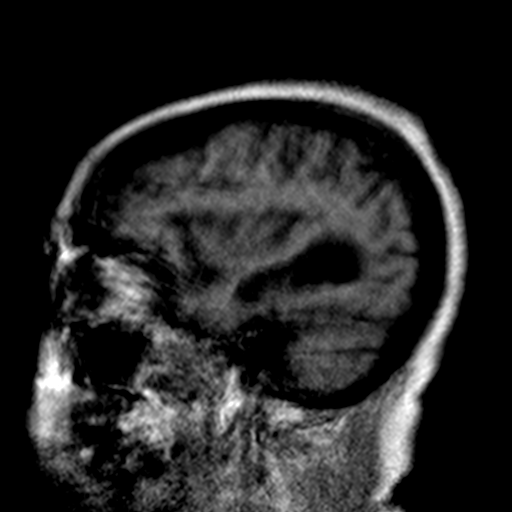

[Series 4: DWI · axial · 3.0mm · 0.82mm/px · z∈[-73,+89]mm · 9 of 55 slices shown (1 of 4)]
[im 1/55]
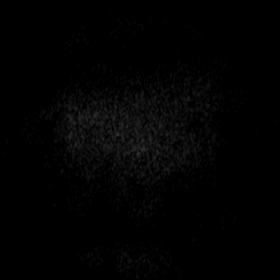
[im 10/55]
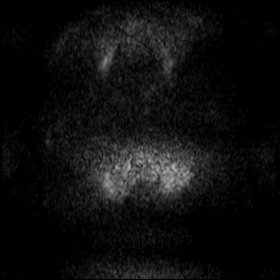
[im 19/55]
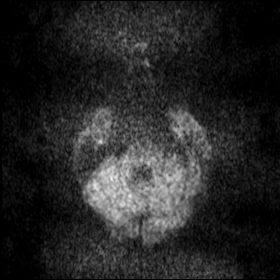
[im 23/55]
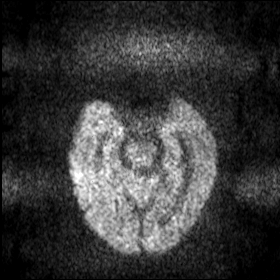
[im 28/55]
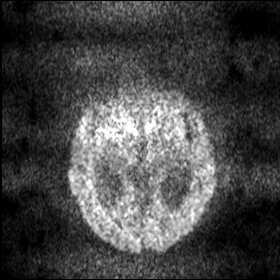
[im 32/55]
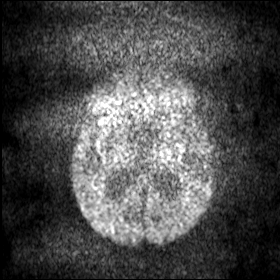
[im 37/55]
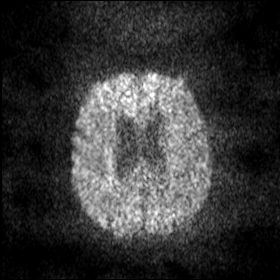
[im 46/55]
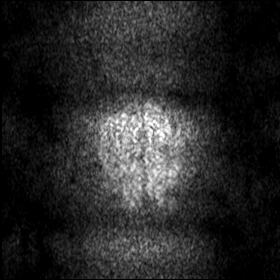
[im 55/55]
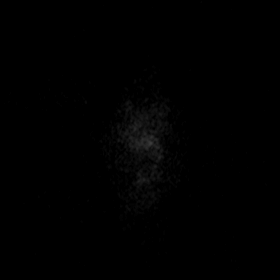

[Series 5: DWI · axial · 3.0mm · 0.82mm/px · z∈[-73,+89]mm · 9 of 55 slices shown (2 of 4)]
[im 1/55]
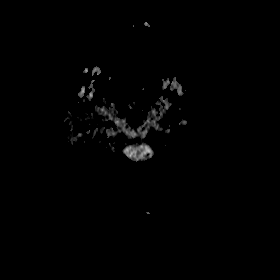
[im 10/55]
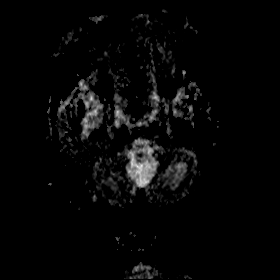
[im 19/55]
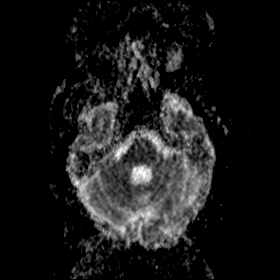
[im 23/55]
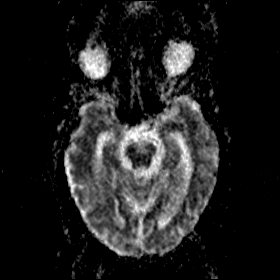
[im 28/55]
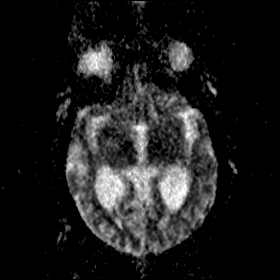
[im 32/55]
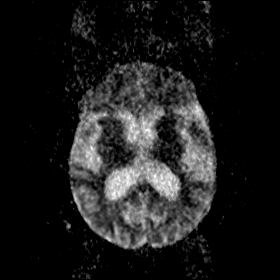
[im 37/55]
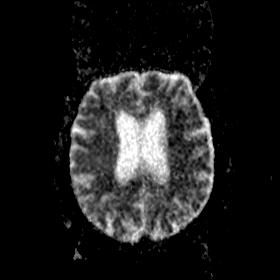
[im 46/55]
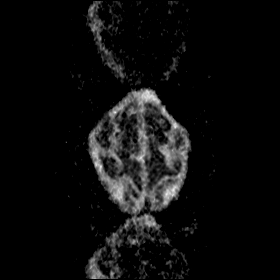
[im 55/55]
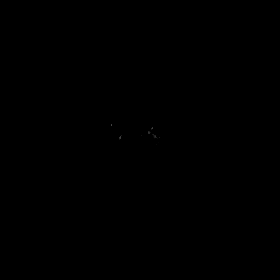

[Series 6: DWI · coronal · 5.0mm · 0.46mm/px · 8 of 33 slices shown (3 of 4)]
[im 1/33]
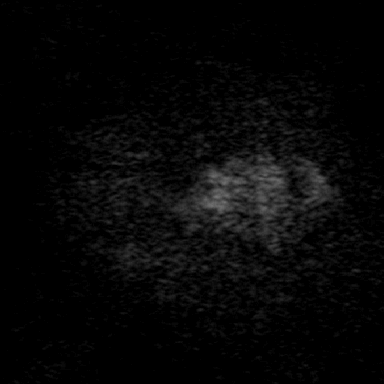
[im 5/33]
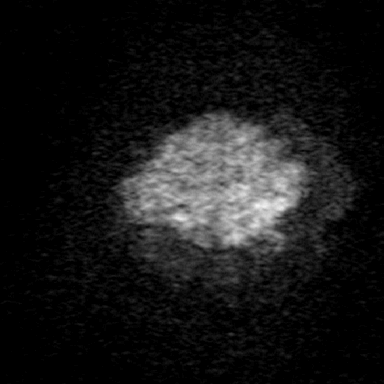
[im 10/33]
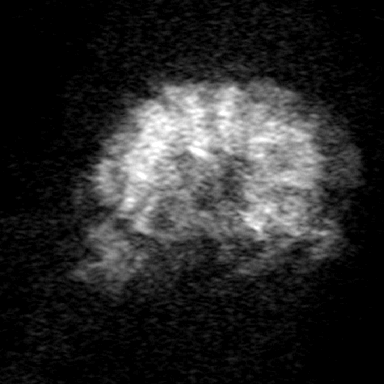
[im 14/33]
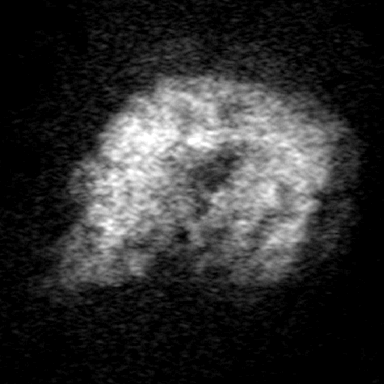
[im 19/33]
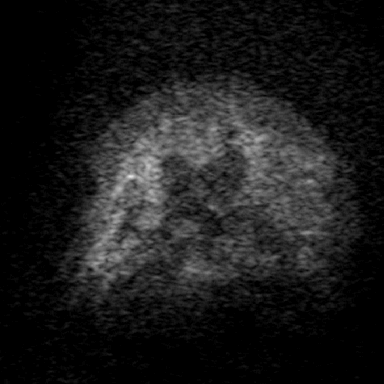
[im 23/33]
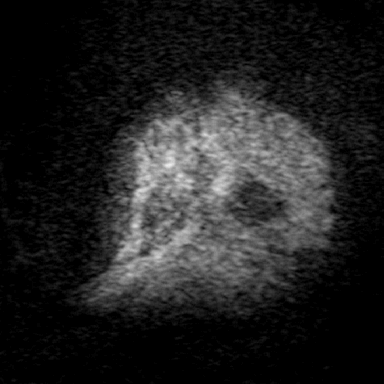
[im 28/33]
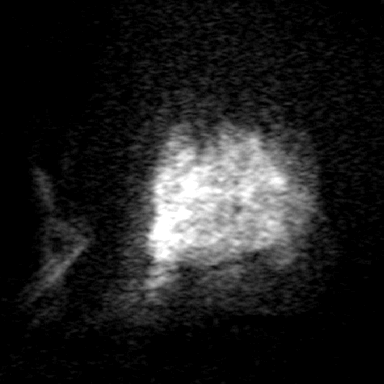
[im 33/33]
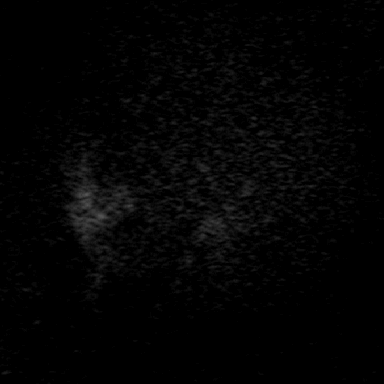

[Series 7: DWI · coronal · 5.0mm · 0.53mm/px · 8 of 34 slices shown (4 of 4)]
[im 1/34]
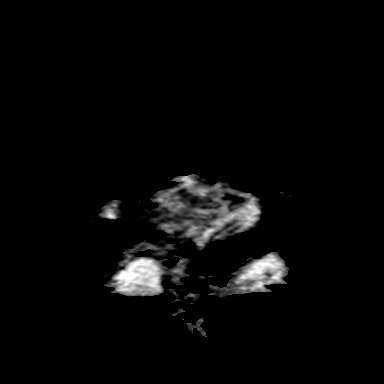
[im 5/34]
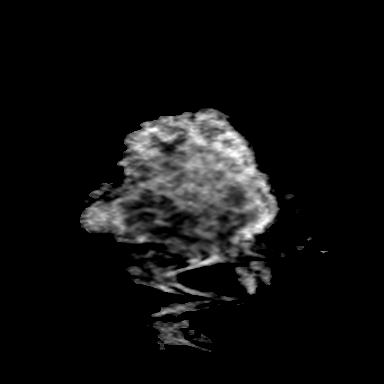
[im 10/34]
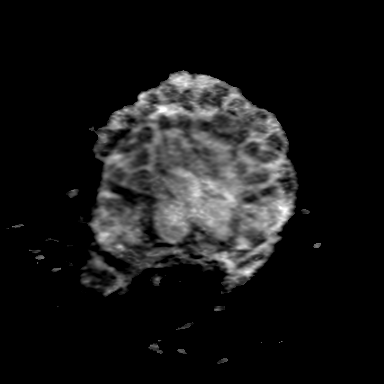
[im 15/34]
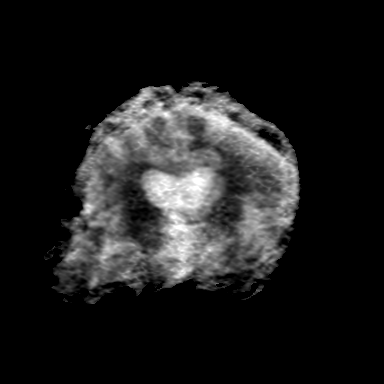
[im 19/34]
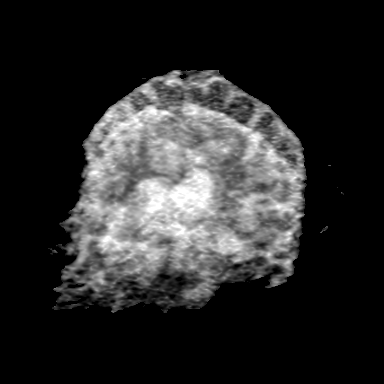
[im 24/34]
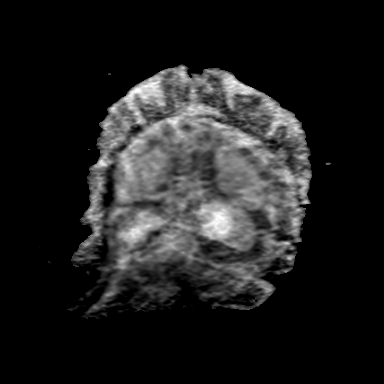
[im 29/34]
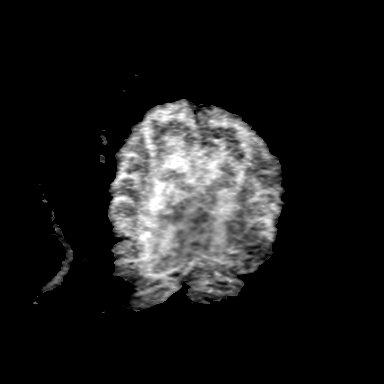
[im 34/34]
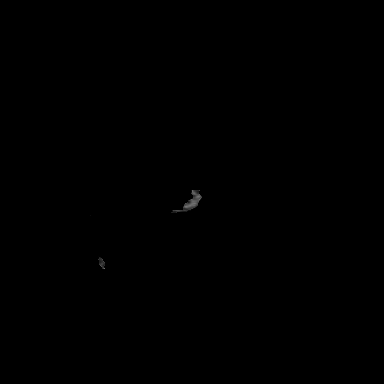

[39 of 48 positions shown; findings below may reference images not displayed]

FINDINGS: Very motion degraded sagittal T1 and diffusion imaging was acquired.
No detected infarct. There is atrophy with ventriculomegaly. No
detected midline shift. The scan was terminated due to patient
request.
IMPRESSION: 1. Only severely motion degraded T1 and diffusion imaging could be
acquired.
2. No detected infarct or other acute finding.
3. Generalized atrophy.
